# Patient Record
Sex: Female | Born: 1960 | Race: Black or African American | Hispanic: No | Marital: Married | Smoking: Never smoker
Health system: Southern US, Community
[De-identification: ages and names within clinical notes are randomized; demographics above are authoritative.]

## PROBLEM LIST (undated history)

## (undated) DIAGNOSIS — E7800 Pure hypercholesterolemia, unspecified: Secondary | ICD-10-CM

## (undated) DIAGNOSIS — I639 Cerebral infarction, unspecified: Secondary | ICD-10-CM

## (undated) DIAGNOSIS — G4730 Sleep apnea, unspecified: Secondary | ICD-10-CM

## (undated) DIAGNOSIS — K219 Gastro-esophageal reflux disease without esophagitis: Secondary | ICD-10-CM

## (undated) DIAGNOSIS — R7303 Prediabetes: Secondary | ICD-10-CM

## (undated) DIAGNOSIS — D649 Anemia, unspecified: Secondary | ICD-10-CM

## (undated) DIAGNOSIS — D242 Benign neoplasm of left breast: Secondary | ICD-10-CM

## (undated) DIAGNOSIS — I10 Essential (primary) hypertension: Secondary | ICD-10-CM

## (undated) DIAGNOSIS — G40209 Localization-related (focal) (partial) symptomatic epilepsy and epileptic syndromes with complex partial seizures, not intractable, without status epilepticus: Secondary | ICD-10-CM

## (undated) DIAGNOSIS — F419 Anxiety disorder, unspecified: Secondary | ICD-10-CM

## (undated) DIAGNOSIS — R569 Unspecified convulsions: Secondary | ICD-10-CM

## (undated) HISTORY — DX: Prediabetes: R73.03

## (undated) HISTORY — PX: CHOLECYSTECTOMY: SHX55

## (undated) HISTORY — PX: COLONOSCOPY: SHX174

## (undated) HISTORY — PX: PARATHYROIDECTOMY: SHX19

---

## 2002-11-28 ENCOUNTER — Ambulatory Visit (HOSPITAL_COMMUNITY): Admission: RE | Admit: 2002-11-28 | Discharge: 2002-11-28 | Payer: Self-pay | Admitting: Obstetrics

## 2002-11-28 IMAGING — US US TRANSVAGINAL NON-OB
1 series · 17 of 25 positions shown · non-contrast
Comparison: none

CLINICAL DATA: Evaluate fibroids.  The patient is scheduled for hysterectomy.  LMP [DATE].
TRANSABDOMINAL AND TRANSVAGINAL PELVIC ULTRASOUND:
Multiple images of the uterus and adnexa were obtained using a transabdominal and endovaginal approaches.  
The uterus has a maximal sagittal length of 11.4 cm and a maximal AP width of 8.1 cm.  The uterine myometrium demonstrates multiple areas of focally altered echotexture with sizes and locations as follows:  In the fundal region measuring 3.1 x 2.2 x 2.2 cm, in the fundal right lateral region measuring 3.0 x 3.8 x 3.0 cm, in the anterior upper uterine segment left laterally measuring 5.8 x 3.7 x 4.0 cm and in the anterior lower uterine segment towards the left measuring 7.1 x 5.6 x 5.0 cm.  All of these fibroids appear to be predominately mural with the largest fibroid deviating the endometrial canal posteriorly suggesting a least partial submucosal component.  The posterior upper uterine segment and fundal region of the uterus could not be well-assessed endovaginally and is shadowed from the anterior fibroids transabdominally.  This portion of the myometrium was incompletely assessed.
The endometrial canal is thin and echogenic with a maximal AP width of .7 cm.  No areas of focal thickening or inhomogeneity are noted.  Incidental note is made of a large nabothian cyst measuring 1.1 x 1.3 x 1.1 cm and containing some internal debris.
The ovaries could not be seen with confidence either transabdominally or endovaginally.  No pelvic fluid is noted.
IMPRESSION
Fibroid uterus and fibroid sizes and locations as noted above.  Incomplete assessment of the posterior fundal and upper uterine segment portion of the myometrium was possible due to the anterior fibroids and uterine size.  Nonvisualized ovaries.  Normal endometrium.

[Series 1: us transvaginal non-ob · 17 of 36 slices shown]
[im 1/36]
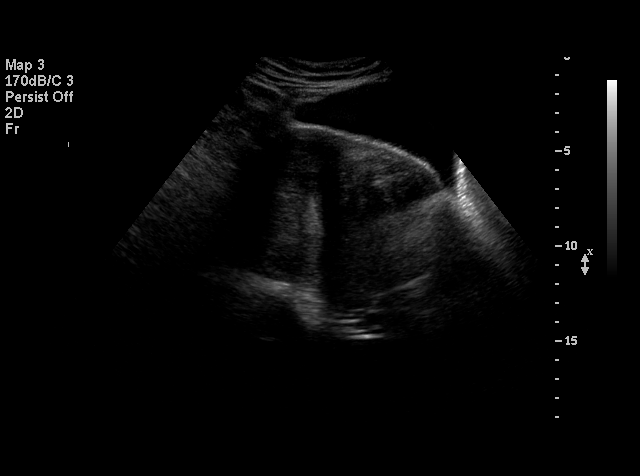
[im 3/36]
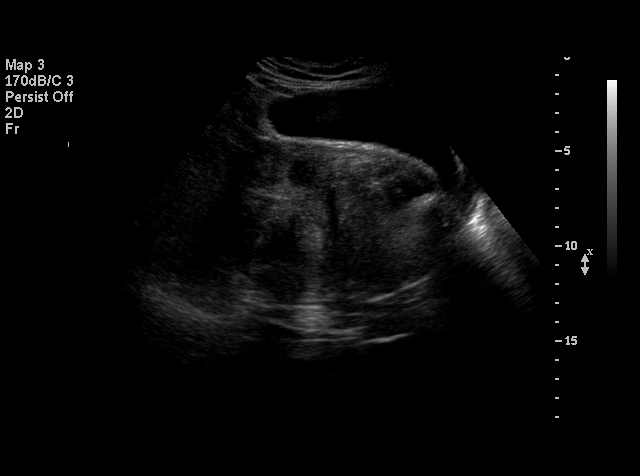
[im 5/36]
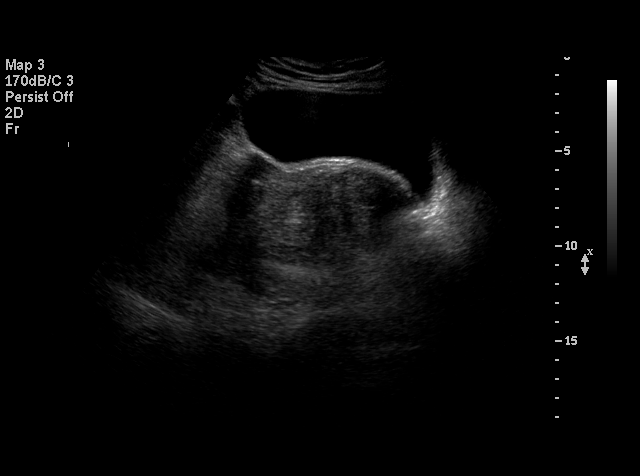
[im 8/36]
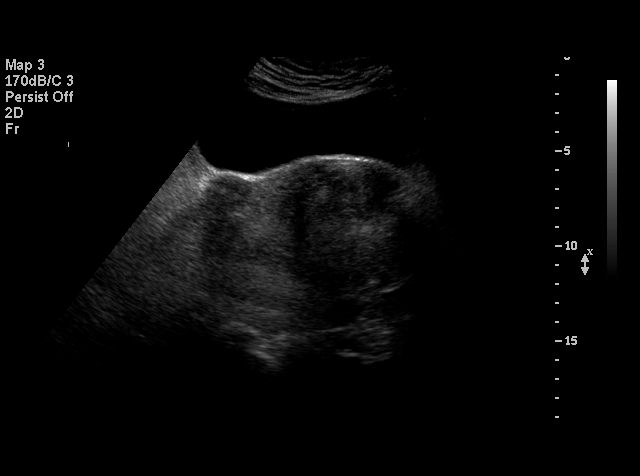
[im 9/36]
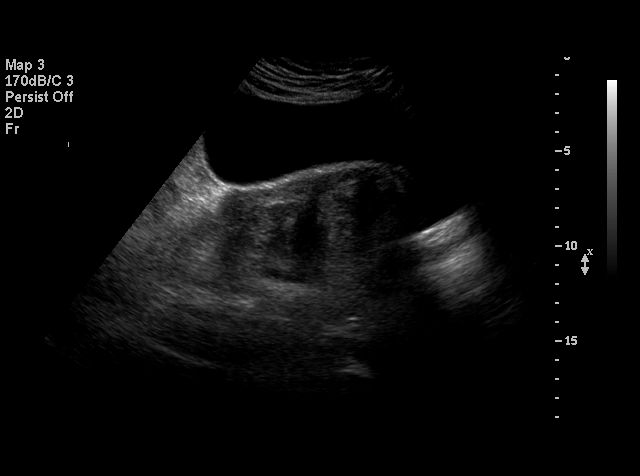
[im 12/36]
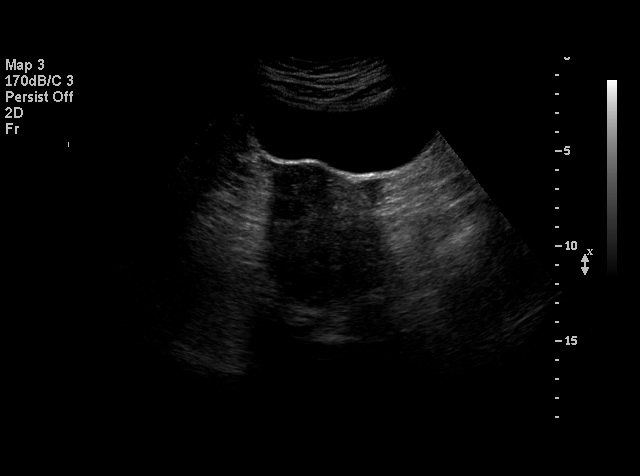
[im 14/36]
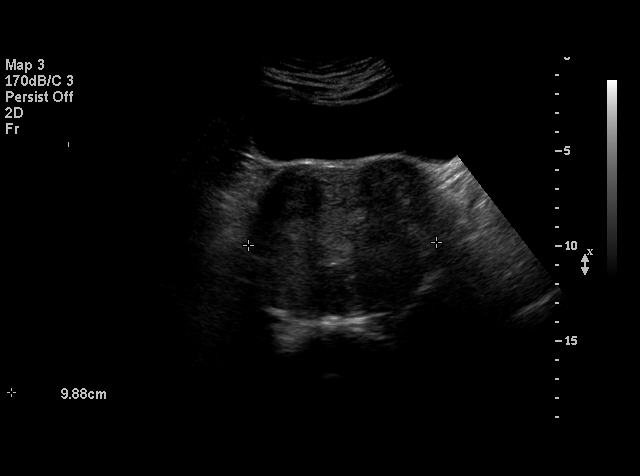
[im 17/36]
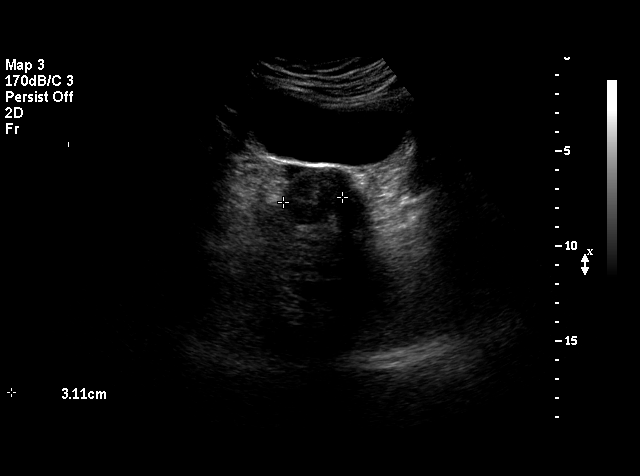
[im 18/36]
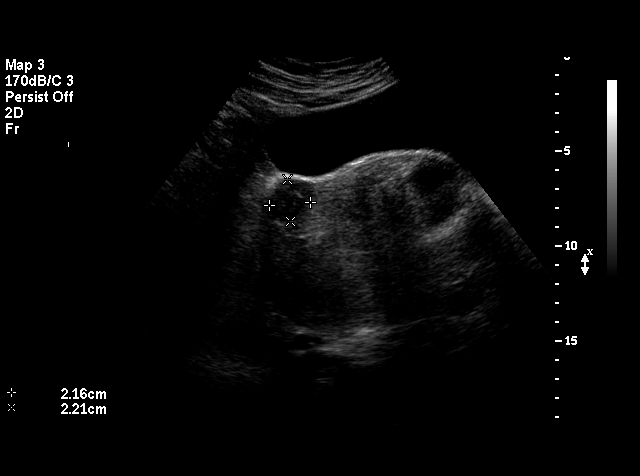
[im 19/36]
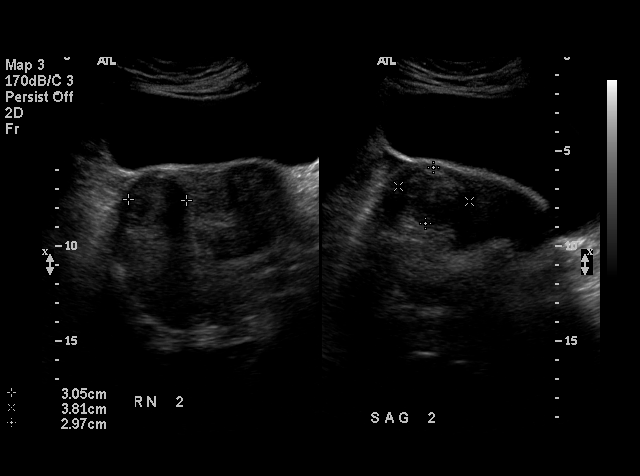
[im 22/36]
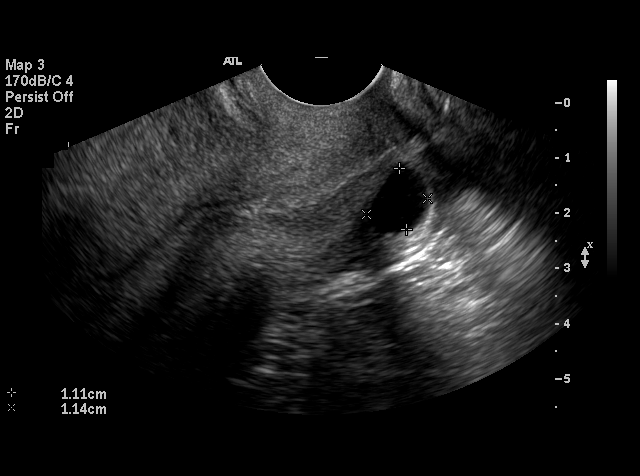
[im 24/36]
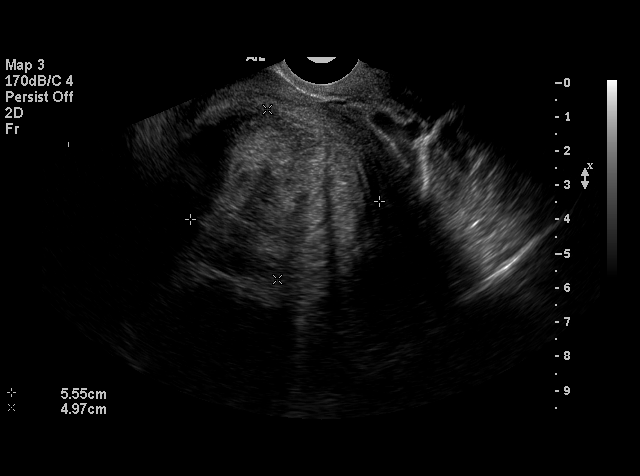
[im 27/36]
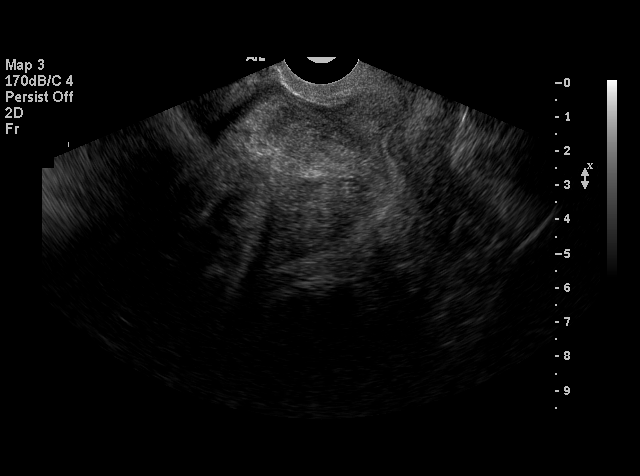
[im 28/36]
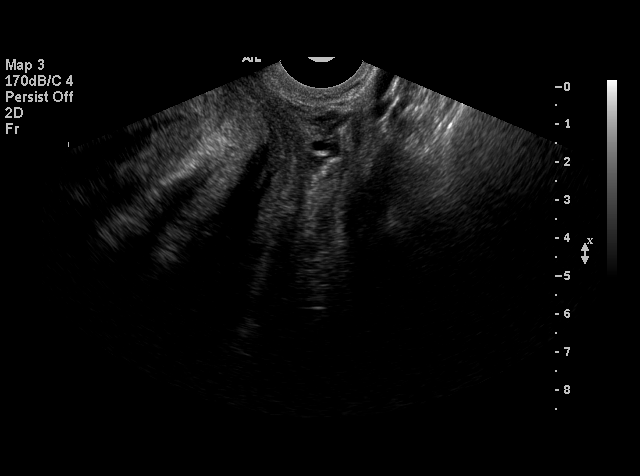
[im 31/36]
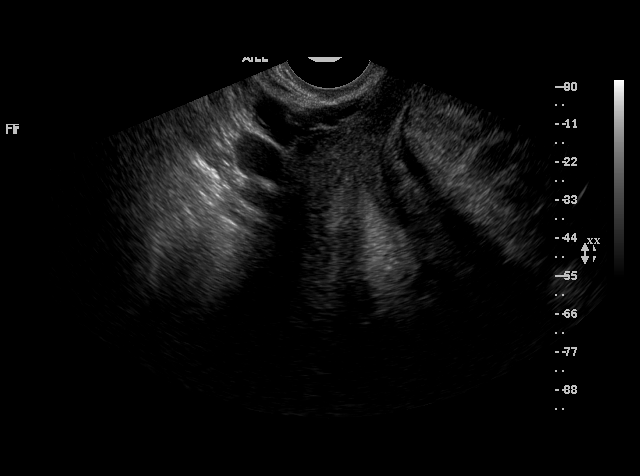
[im 33/36]
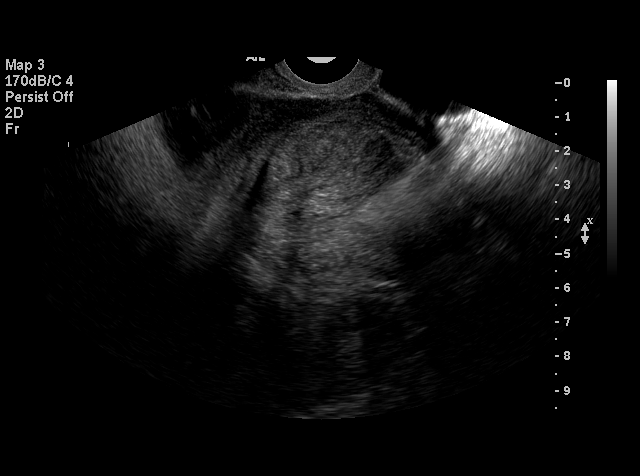
[im 36/36]
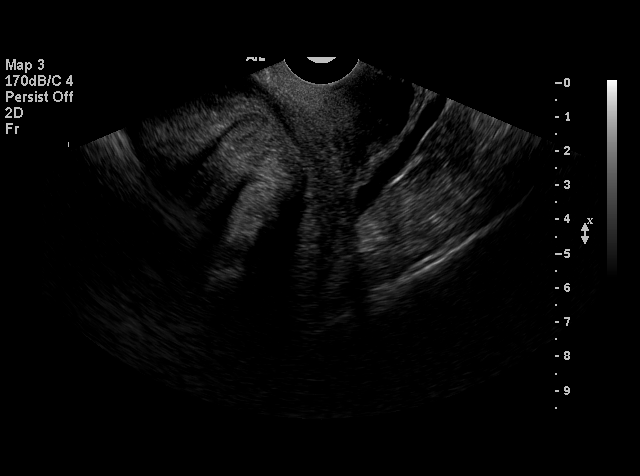

[17 of 25 positions shown; findings below may reference images not displayed]

## 2002-12-13 ENCOUNTER — Encounter (INDEPENDENT_AMBULATORY_CARE_PROVIDER_SITE_OTHER): Payer: Self-pay | Admitting: Specialist

## 2002-12-13 ENCOUNTER — Inpatient Hospital Stay (HOSPITAL_COMMUNITY): Admission: RE | Admit: 2002-12-13 | Discharge: 2002-12-16 | Payer: Self-pay | Admitting: Obstetrics

## 2002-12-13 HISTORY — PX: ABDOMINAL HYSTERECTOMY: SHX81

## 2004-02-22 ENCOUNTER — Emergency Department (HOSPITAL_COMMUNITY): Admission: EM | Admit: 2004-02-22 | Discharge: 2004-02-22 | Payer: Self-pay | Admitting: Family Medicine
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
## 2004-05-15 ENCOUNTER — Ambulatory Visit (HOSPITAL_COMMUNITY): Admission: RE | Admit: 2004-05-15 | Discharge: 2004-05-15 | Payer: Self-pay | Admitting: Obstetrics

## 2004-05-15 IMAGING — US US TRANSVAGINAL NON-OB
1 series · 14 of 25 positions shown · non-contrast
Comparison: none

CLINICAL DATA: Status post hysterectomy and left oophorectomy. Assess right ovary.  Pelvic pain.
 TRANSABDOMINAL AND ENDOVAGINAL PELVIC ULTRASOUND:
 Multiple images of the pelvis were obtained using a transabdominal and endovaginal approaches. 
 A normal vaginal cuff is seen.  The right ovary measures 3.0  x2.1 x 1.9 cm and has a normal appearance.  No pelvic fluid or separate adnexal masses are noted.

[Series 1: us transvaginal non-ob · 0.39mm/px · 14 of 32 slices shown]
[im 1/32]
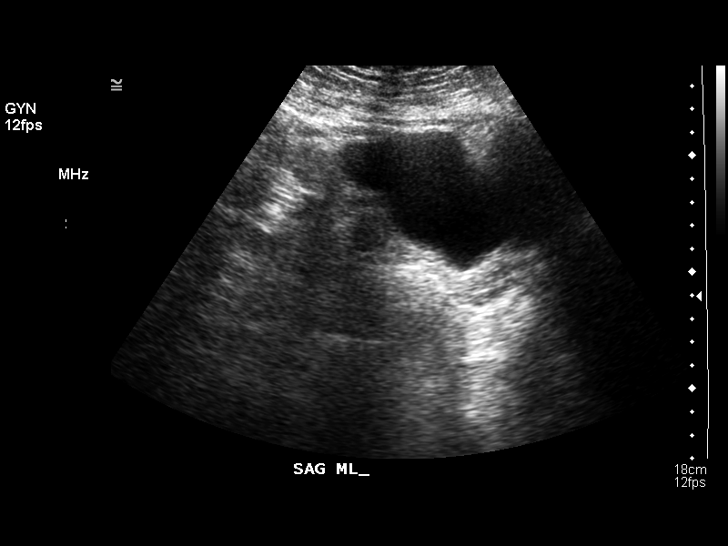
[im 3/32]
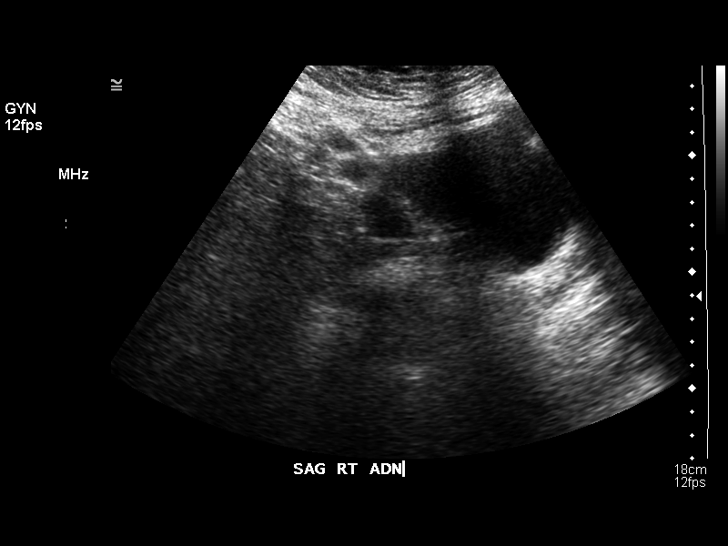
[im 6/32]
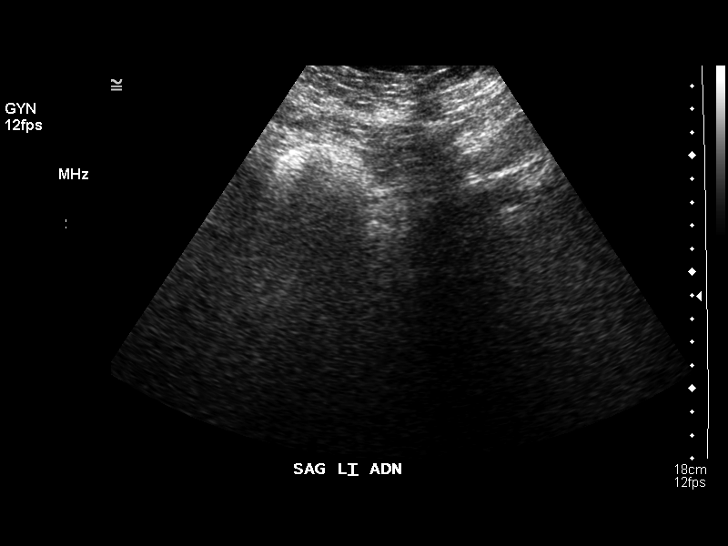
[im 8/32]
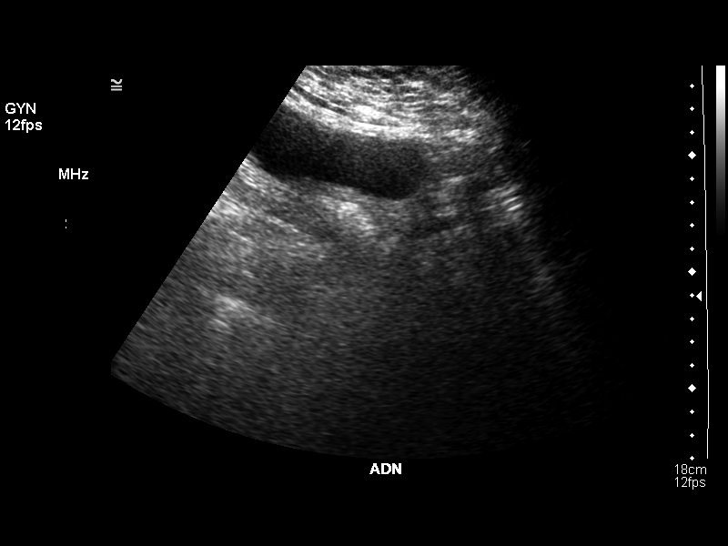
[im 11/32]
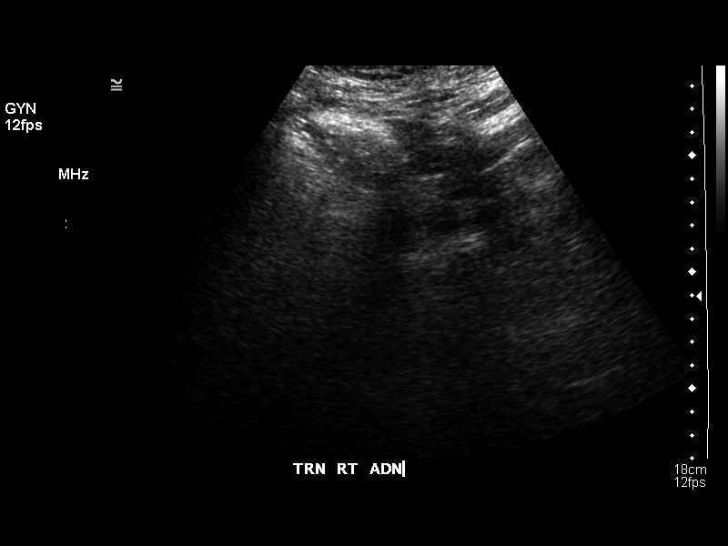
[im 12/32]
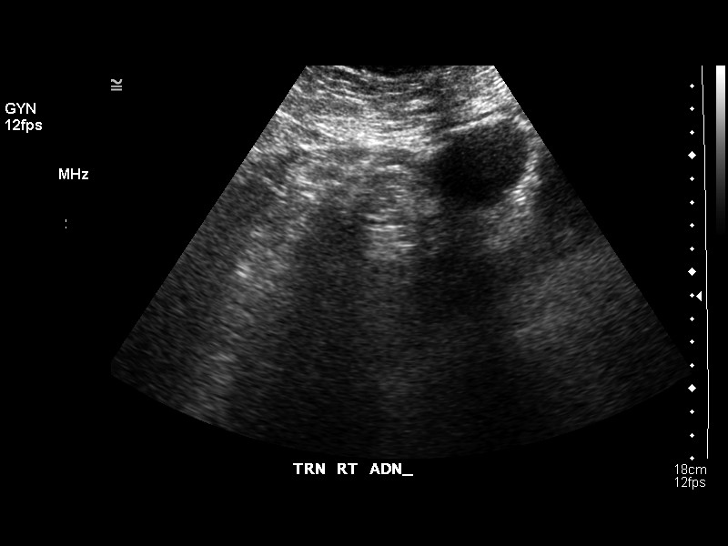
[im 15/32]
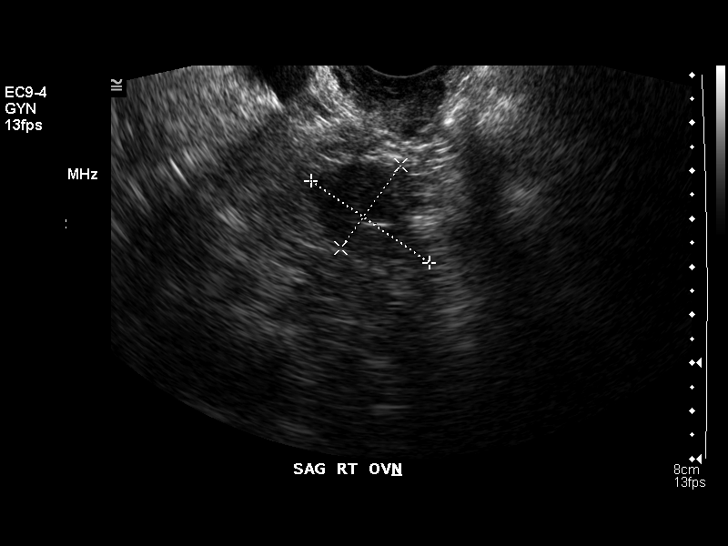
[im 17/32]
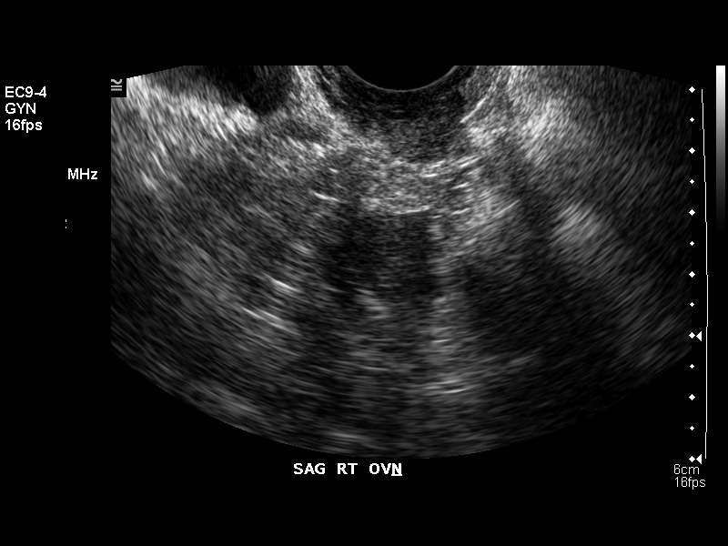
[im 20/32]
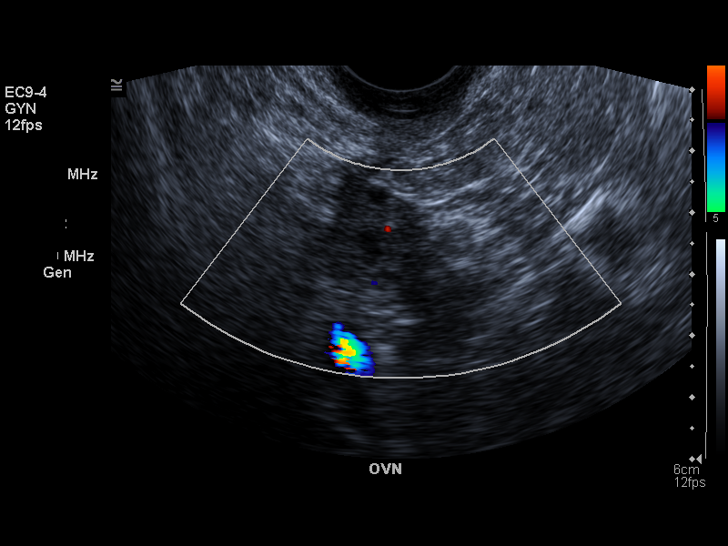
[im 21/32]
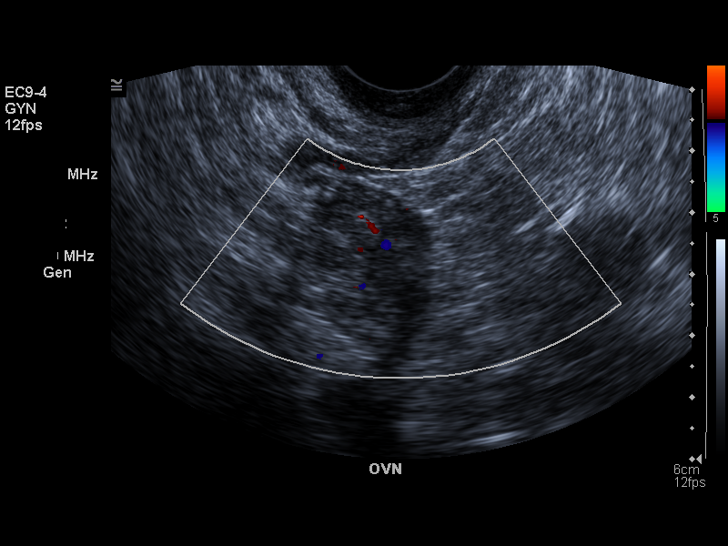
[im 24/32]
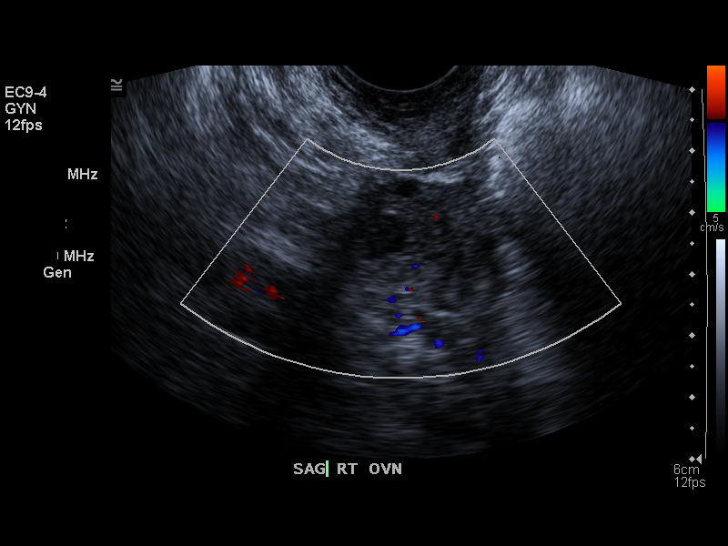
[im 26/32]
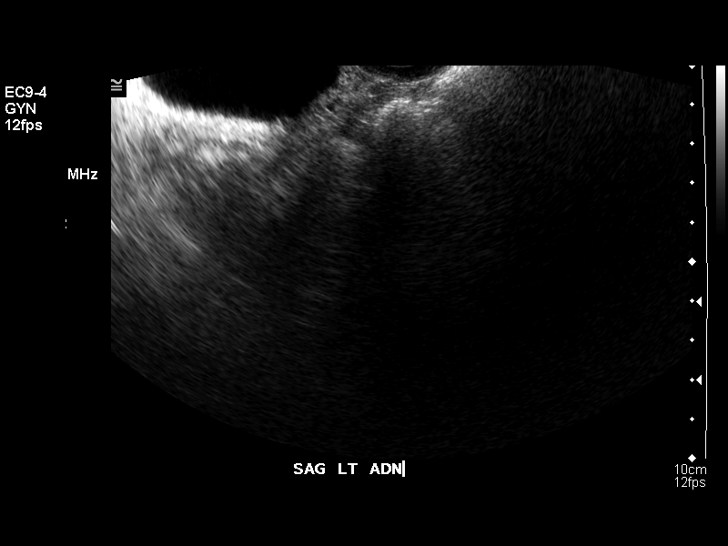
[im 29/32]
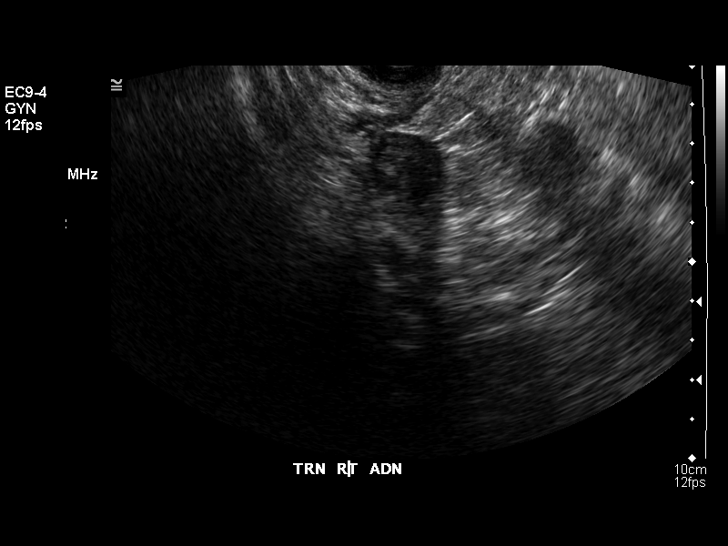
[im 32/32]
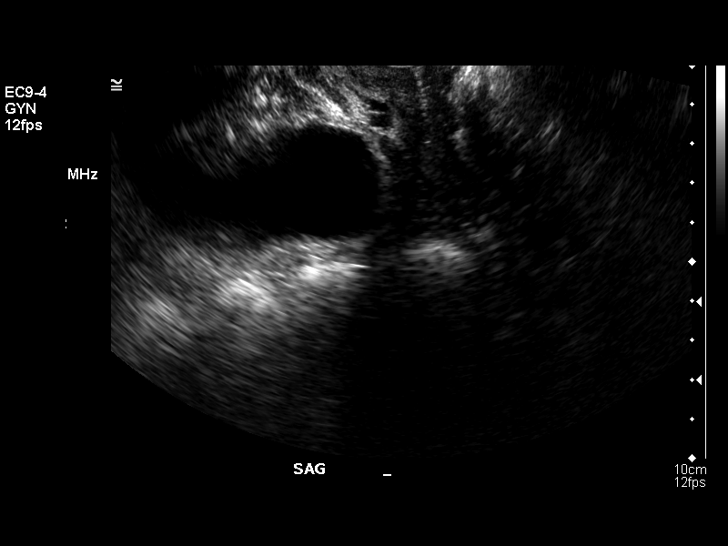

[14 of 25 positions shown; findings below may reference images not displayed]

IMPRESSION: Normal vaginal cuff and right ovary.

## 2006-02-02 ENCOUNTER — Ambulatory Visit (HOSPITAL_COMMUNITY): Admission: RE | Admit: 2006-02-02 | Discharge: 2006-02-02 | Payer: Self-pay | Admitting: Family Medicine

## 2006-02-17 ENCOUNTER — Encounter: Admission: RE | Admit: 2006-02-17 | Discharge: 2006-02-17 | Payer: Self-pay | Admitting: Family Medicine

## 2006-06-09 ENCOUNTER — Emergency Department (HOSPITAL_COMMUNITY): Admission: EM | Admit: 2006-06-09 | Discharge: 2006-06-09 | Payer: Self-pay | Admitting: Family Medicine

## 2006-11-16 ENCOUNTER — Emergency Department (HOSPITAL_COMMUNITY): Admission: EM | Admit: 2006-11-16 | Discharge: 2006-11-16 | Payer: Self-pay | Admitting: Emergency Medicine

## 2007-03-30 ENCOUNTER — Encounter: Admission: RE | Admit: 2007-03-30 | Discharge: 2007-03-30 | Payer: Self-pay | Admitting: Family Medicine

## 2008-02-01 ENCOUNTER — Emergency Department (HOSPITAL_COMMUNITY): Admission: EM | Admit: 2008-02-01 | Discharge: 2008-02-01 | Payer: Self-pay | Admitting: Family Medicine

## 2008-04-11 ENCOUNTER — Encounter: Admission: RE | Admit: 2008-04-11 | Discharge: 2008-04-11 | Payer: Self-pay | Admitting: Family Medicine

## 2009-05-27 ENCOUNTER — Emergency Department (HOSPITAL_COMMUNITY): Admission: EM | Admit: 2009-05-27 | Discharge: 2009-05-27 | Payer: Self-pay | Admitting: Family Medicine

## 2009-07-03 ENCOUNTER — Encounter: Admission: RE | Admit: 2009-07-03 | Discharge: 2009-07-03 | Payer: Self-pay | Admitting: Family Medicine

## 2009-10-06 ENCOUNTER — Emergency Department (HOSPITAL_COMMUNITY): Admission: EM | Admit: 2009-10-06 | Discharge: 2009-10-06 | Payer: Self-pay | Admitting: Emergency Medicine

## 2010-02-03 ENCOUNTER — Encounter: Payer: Self-pay | Admitting: Family Medicine

## 2010-03-06 ENCOUNTER — Ambulatory Visit: Payer: Self-pay

## 2010-03-06 IMAGING — NM NM NECK EXAM
2 series · 6 of 6 positions shown · non-contrast
Comparison: none

REASON FOR EXAM: hyperparathryoidism  Dr.ARISSA[PHONE_NUMBER]
f[PHONE_NUMBER]   [J9] Premi...
COMMENTS:

[Series 1000: parathyroid 3 hrs · 0.90mm/px · 3 of 3 slices shown]
[im 1/3  full-range]
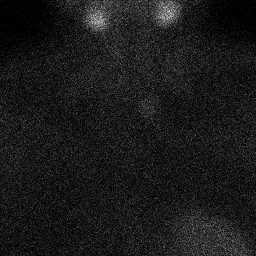
[im 2/3  full-range]
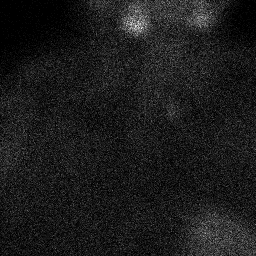
[im 3/3  full-range]
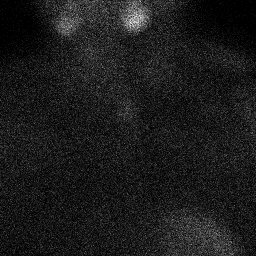

[Series 1000: parathyroid (id) · 0.90mm/px · 3 of 3 slices shown]
[im 1/3  full-range]
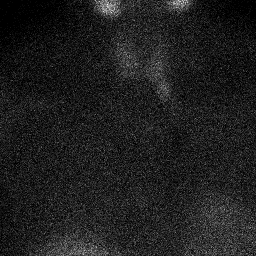
[im 2/3  full-range]
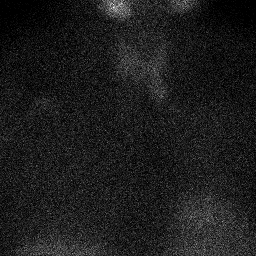
[im 3/3  full-range]
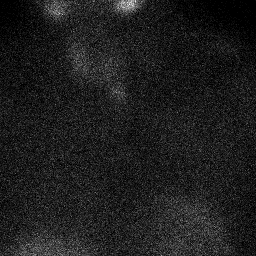

[6 of 6 positions shown; findings below may reference images not displayed]

PROCEDURE:     KNM - KNM PARATHYROID IMAGE  - [DATE] [DATE]

RESULT:     The patient received an injection of 22.66 mCi of technetium 99m
sestamibi. Anterior and bilateral anterior oblique planar images were
obtained at 10 minutes after injection and approximately 3 hours after
injection with a pinhole image without and with a suprasternal notch marker
obtained at approximately 3.5 hours after injection. The initial and delayed
images show a focal area of abnormal localization at the lower pole region
of the left lobe of the thyroid which can be consistent with a parathyroid
adenoma. Only a single focus is seen. This appears to be near the level of
the suprasternal notch according to the marker. No definite mediastinal
abnormal localization is seen. Salivary uptake appears unremarkable.
IMPRESSION: Findings are consistent with a probable left parathyroid adenoma inferior to
the lower pole of the left thyroid gland.

## 2010-04-29 ENCOUNTER — Emergency Department (HOSPITAL_COMMUNITY): Payer: No Typology Code available for payment source

## 2010-04-29 ENCOUNTER — Emergency Department (HOSPITAL_COMMUNITY)
Admission: EM | Admit: 2010-04-29 | Discharge: 2010-04-30 | Disposition: A | Discharge status: Home / Self Care | Payer: No Typology Code available for payment source | Attending: Emergency Medicine | Admitting: Emergency Medicine

## 2010-04-29 DIAGNOSIS — Z79899 Other long term (current) drug therapy: Secondary | ICD-10-CM | POA: Insufficient documentation

## 2010-04-29 DIAGNOSIS — S139XXA Sprain of joints and ligaments of unspecified parts of neck, initial encounter: Secondary | ICD-10-CM | POA: Insufficient documentation

## 2010-04-29 DIAGNOSIS — E785 Hyperlipidemia, unspecified: Secondary | ICD-10-CM | POA: Insufficient documentation

## 2010-04-29 DIAGNOSIS — Y929 Unspecified place or not applicable: Secondary | ICD-10-CM | POA: Insufficient documentation

## 2010-04-29 DIAGNOSIS — S0990XA Unspecified injury of head, initial encounter: Secondary | ICD-10-CM | POA: Insufficient documentation

## 2010-04-29 DIAGNOSIS — G40909 Epilepsy, unspecified, not intractable, without status epilepticus: Secondary | ICD-10-CM | POA: Insufficient documentation

## 2010-04-29 DIAGNOSIS — R51 Headache: Secondary | ICD-10-CM | POA: Insufficient documentation

## 2010-04-29 DIAGNOSIS — R413 Other amnesia: Secondary | ICD-10-CM | POA: Insufficient documentation

## 2010-04-29 LAB — RAPID URINE DRUG SCREEN, HOSP PERFORMED
Amphetamines: NOT DETECTED
Barbiturates: POSITIVE — AB
Benzodiazepines: NOT DETECTED
Cocaine: NOT DETECTED
Opiates: NOT DETECTED
Tetrahydrocannabinol: NOT DETECTED
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
## 2010-04-30 LAB — PHENOBARBITAL LEVEL: Phenobarbital: 34.2 ug/mL (ref 15.0–40.0)

## 2010-05-31 NOTE — Discharge Summary (Signed)
NAME:  Tammy Rodgers, Tammy Rodgers                           ACCOUNT NO.:  [id number]   MEDICAL RECORD NO.:  [id number]                   PATIENT TYPE:   LOCATION:                                       FACILITY:   PHYSICIAN:  Kathreen Cosier, M.D.           DATE OF BIRTH:   DATE OF ADMISSION:  DATE OF DISCHARGE:                                 DISCHARGE SUMMARY   HISTORY OF PRESENT ILLNESS:  The patient is a 50 year old female with 20-  week sized myomas who was having abnormal bleeding, pressure, and pain and  brought in for definitive therapy.  On admission her hemoglobin was 12.9,  platelets 307,000, white count 3.5.  PT/PTT normal.  Sodium 142, potassium  4.2, chloride 102, BUN 9, creatinine 0.7.  The patient underwent a TAH and  left salpingo-oophorectomy.  Blood loss was 200 mL.  Postoperatively she did  well.  Her hemoglobin postoperatively was 10.4.  She was passing flatus, had  bowel movement, and had no complaint at the time of discharge.  She was  discharged on Tylox one to two q.3-4h. p.r.n. to see me in four weeks.   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                                               Kathreen Cosier, M.D.    BAM/MEDQ  D:  12/16/2002  T:  12/16/2002  Job:  [id number]

## 2010-05-31 NOTE — Op Note (Signed)
NAMECARMELLE, Tammy Rodgers                         ACCOUNT NO.:  [id number]   MEDICAL RECORD NO.:  [id number]                   PATIENT TYPE:  INP   LOCATION:  9309                                 FACILITY:  [facility]   PHYSICIAN:  Kathreen Cosier, M.D.           DATE OF BIRTH:  20-Nov-1960   DATE OF PROCEDURE:  12/13/2002  DATE OF DISCHARGE:  12/16/2002                                 OPERATIVE REPORT   PREOPERATIVE DIAGNOSIS:  Myoma uteri.   SURGEON:  Kathreen Cosier, M.D.   FIRST ASSISTANT:  Charles A. Clearance Coots, M.D.   ANESTHESIA:  General.   DESCRIPTION OF PROCEDURE:  Patient placed on the operating table in supine  position.  Abdomen prepped and draped, bladder emptied with a Foley  catheter.  A transverse suprapubic incision was carried down to the rectus  fascia, the fascia cleaned and incised the length of the incision, the recti  muscles retracted laterally.  She was noted to have multiple myomas,  approximately seven, and had one pregnancy.  The right round ligament was  grasped with a Kelly clamp and suture ligated with #1 chromic.  We proceeded  down in a similar fashion to the other side.  Using the Metzenbaum scissors,  a bladder flap was developed.  The right utero-ovarian ligament was grasped  with a Kelly clamp and suture ligated with #1 chromic.  The procedure done  in a similar fashion on the left side.  The uterine vessels were  skeletonized bilaterally, double clamped on the right with Heaney clamps,  both suture ligated with #1 chromic.  The procedure done in a similar  fashion on the left side.  The bladder was pushed off of the cervix.  The  cardinal and uterosacral ligaments grasped with a straight Kocher clamp.  The pedicle was cut and sutured with #1 chromic.  The specimen consisting of  the uterus was removed at the cervicovaginal junction.  Modified Richardson  sutures placed in the angles of the vagina and the vaginal vault run with  interlocking suture of  #1 chromic.  Hemostasis was satisfactory.  She had  persistent bleeding from the left ovary, which did not stop with multiple  repairs.  A Kelly clamp was placed across the infundibulopelvic ligament,  cut, suture ligated with #1 chromic.  Hemostasis was satisfactory, lap and  sponge counts correct, blood loss 200 mL.  Abdomen closed in layers,  peritoneum with continuous suture of 0 chromic, fascia with continuous  suture of 0 Dexon, and the skin closed with subcuticular stitch of 4-0  Monocryl.                                               Kathreen Cosier, M.D.    BAM/MEDQ  D:  01/13/2003  T:  01/13/2003  Job:  [id number]

## 2010-11-20 ENCOUNTER — Emergency Department (INDEPENDENT_AMBULATORY_CARE_PROVIDER_SITE_OTHER)
Admission: EM | Admit: 2010-11-20 | Discharge: 2010-11-20 | Disposition: A | Discharge status: Home / Self Care | Source: Home / Self Care | Attending: Family Medicine | Admitting: Family Medicine

## 2010-11-20 DIAGNOSIS — H6980 Other specified disorders of Eustachian tube, unspecified ear: Secondary | ICD-10-CM

## 2010-11-20 HISTORY — DX: Pure hypercholesterolemia, unspecified: E78.00

## 2010-11-20 MED ORDER — FLUTICASONE PROPIONATE 50 MCG/ACT NA SUSP: 2.0000 | Freq: Every day | NASAL | Status: DC

## 2010-11-20 NOTE — ED Provider Notes (Signed)
History     CSN: [id number] Arrival date & time: 11/20/2010  8:08 PM   First MD Initiated Contact with Patient 11/20/10 2009      Chief Complaint  Patient presents with  . Otalgia    (Consider location/radiation/quality/duration/timing/severity/associated sxs/prior treatment) Patient is a 50 y.o. female presenting with ear pain. The history is provided by the patient.  Otalgia This is a new problem. The current episode started more than 2 days ago. There is pain in both ears. The problem occurs constantly. The problem has not changed since onset.There has been no fever. The pain is mild. Associated symptoms include headaches and cough. Pertinent negatives include no ear discharge, no hearing loss, no sore throat and no neck pain. Her past medical history does not include chronic ear infection or hearing loss.    Past Medical History  Diagnosis Date  . Seizure disorder   . High cholesterol     Past Surgical History  Procedure Date  . Abdominal hysterectomy   . Cholecystectomy     History reviewed. No pertinent family history.  History  Substance Use Topics  . Smoking status: Never Smoker   . Smokeless tobacco: Not on file  . Alcohol Use: No    OB History    Grav Para Term Preterm Abortions TAB SAB Ect Mult Living                  Review of Systems  Constitutional: Negative.   HENT: Positive for ear pain, congestion and tinnitus. Negative for hearing loss, sore throat, sneezing, trouble swallowing, neck pain, neck stiffness, sinus pressure and ear discharge.   Eyes: Negative.   Respiratory: Positive for cough.   Cardiovascular: Negative.   Gastrointestinal: Negative.   Genitourinary: Negative.   Neurological: Positive for headaches.    Allergies  Review of patient's allergies indicates no known allergies.  Home Medications   Current Outpatient Rx  Name Route Sig Dispense Refill  . EZETIMIBE-SIMVASTATIN 10-10 MG PO TABS Oral Take 1 tablet by mouth at  bedtime.      Marland Kitchen LAMOTRIGINE 200 MG PO TABS Oral Take 200 mg by mouth daily.      Marland Kitchen PHENOBARBITAL 100 MG PO TABS Oral Take 100 mg by mouth 2 (two) times daily.        BP 149/74  Pulse 76  Temp(Src) 98.2 F (36.8 C) (Oral)  Resp 17  SpO2 99%  Physical Exam  Constitutional: She appears well-developed and well-nourished.  HENT:  Head: Normocephalic and atraumatic.  Right Ear: No drainage. Tympanic membrane is retracted. Tympanic membrane is not perforated, not erythematous and not bulging. No hemotympanum.  Left Ear: No drainage. Tympanic membrane is retracted. Tympanic membrane is not perforated, not erythematous and not bulging. No hemotympanum.  Nose: Nose normal.  Mouth/Throat: Uvula is midline, oropharynx is clear and moist and mucous membranes are normal. No posterior oropharyngeal erythema.  Eyes: EOM are normal.  Neck: Normal range of motion. Neck supple.    ED Course  Procedures (including critical care time)  Labs Reviewed - No data to display No results found.   No diagnosis found.    MDM          Richardo Priest, MD 11/20/10 2030

## 2010-11-20 NOTE — ED Notes (Signed)
Pain in both ears for couple pf days

## 2011-01-04 ENCOUNTER — Emergency Department (INDEPENDENT_AMBULATORY_CARE_PROVIDER_SITE_OTHER)
Admission: EM | Admit: 2011-01-04 | Discharge: 2011-01-04 | Disposition: A | Discharge status: Home / Self Care | Source: Home / Self Care | Attending: Family Medicine | Admitting: Family Medicine

## 2011-01-04 ENCOUNTER — Encounter (HOSPITAL_COMMUNITY): Payer: Self-pay

## 2011-01-04 DIAGNOSIS — R1032 Left lower quadrant pain: Secondary | ICD-10-CM

## 2011-01-04 DIAGNOSIS — IMO0001 Reserved for inherently not codable concepts without codable children: Secondary | ICD-10-CM

## 2011-01-04 LAB — POCT URINALYSIS DIP (DEVICE)
Bilirubin Urine: NEGATIVE
Glucose, UA: NEGATIVE mg/dL
Hgb urine dipstick: NEGATIVE
Ketones, ur: NEGATIVE mg/dL
Leukocytes, UA: NEGATIVE
Nitrite: NEGATIVE
Protein, ur: NEGATIVE mg/dL
Specific Gravity, Urine: 1.015 (ref 1.005–1.030)
Urobilinogen, UA: 0.2 mg/dL (ref 0.0–1.0)
pH: 8.5 — ABNORMAL HIGH (ref 5.0–8.0)

## 2011-01-04 MED ORDER — HYDROCODONE-ACETAMINOPHEN 5-325 MG PO TABS: ORAL_TABLET | ORAL | Status: AC

## 2011-01-04 NOTE — ED Provider Notes (Signed)
History     CSN: [id number]  Arrival date & time 01/04/11  0909   First MD Initiated Contact with Patient 01/04/11 [phone]      Chief Complaint  Patient presents with  . Flank Pain    (Consider location/radiation/quality/duration/timing/severity/associated sxs/prior treatment) HPI Comments: Tammy Rodgers presents for evaluation of sudden onset of LEFT groin pain that started this morning around 5 am. She reports several bowel movements at the time, with frequent urination. She denies any hematuria, no dysuria, no change in appetite, no fever. She reports hx of hysterectomy.  Patient is a 50 y.o. female presenting with groin pain. The history is provided by the patient.  Groin Pain This is a new problem. The current episode started 3 to 5 hours ago. The problem occurs constantly. The problem has been gradually improving. The symptoms are aggravated by nothing. The symptoms are relieved by nothing. Treatments tried: ibuprofen. The treatment provided mild relief.    Past Medical History  Diagnosis Date  . Seizure disorder   . High cholesterol     Past Surgical History  Procedure Date  . Abdominal hysterectomy   . Cholecystectomy     History reviewed. No pertinent family history.  History  Substance Use Topics  . Smoking status: Never Smoker   . Smokeless tobacco: Not on file  . Alcohol Use: No    OB History    Grav Para Term Preterm Abortions TAB SAB Ect Mult Living                  Review of Systems  Constitutional: Negative.   HENT: Negative.   Eyes: Negative.   Respiratory: Negative.   Cardiovascular: Negative.   Gastrointestinal: Negative.   Genitourinary: Positive for frequency, flank pain and pelvic pain. Negative for dysuria, urgency, hematuria, vaginal bleeding, vaginal discharge and vaginal pain.  Musculoskeletal: Negative.   Skin: Negative.   Neurological: Negative.     Allergies  Review of patient's allergies indicates no known allergies.  Home  Medications   Current Outpatient Rx  Name Route Sig Dispense Refill  . EZETIMIBE-SIMVASTATIN 10-10 MG PO TABS Oral Take 1 tablet by mouth at bedtime.      Marland Kitchen FLUTICASONE PROPIONATE 50 MCG/ACT NA SUSP Nasal Place 2 sprays into the nose daily. 16 g 2  . HYDROCODONE-ACETAMINOPHEN 5-325 MG PO TABS  Take one to two tablets every 4 to 6 hours as needed for pain 20 tablet 0  . LAMOTRIGINE 200 MG PO TABS Oral Take 200 mg by mouth daily.      Marland Kitchen PHENOBARBITAL 100 MG PO TABS Oral Take 100 mg by mouth 2 (two) times daily.        BP 149/81  Pulse 80  Temp(Src) 97.1 F (36.2 C) (Oral)  Resp 18  SpO2 97%  Physical Exam  Nursing note and vitals reviewed. Constitutional: She is oriented to person, place, and time. She appears well-developed and well-nourished.  HENT:  Head: Normocephalic and atraumatic.  Eyes: EOM are normal.  Neck: Normal range of motion.  Pulmonary/Chest: Effort normal.  Abdominal: Soft. Normal appearance and bowel sounds are normal. There is no tenderness.  Musculoskeletal: Normal range of motion.  Neurological: She is alert and oriented to person, place, and time.  Skin: Skin is warm and dry.  Psychiatric: Her behavior is normal.    ED Course  Procedures (including critical care time)  Labs Reviewed  POCT URINALYSIS DIP (DEVICE) - Abnormal; Notable for the following:    pH 8.5 (*)  All other components within normal limits  POCT URINALYSIS DIPSTICK   No results found.   1. Groin pain, left lower quadrant       MDM  Suspect passage of kidney stone; return to ED if symptoms worsen in any way.         Richardo Priest, MD 01/04/11 [phone]

## 2011-01-04 NOTE — ED Notes (Signed)
Pt has lt flank and lower abd pain since 5am today, frequent urination.

## 2012-06-27 ENCOUNTER — Encounter (HOSPITAL_COMMUNITY): Payer: Self-pay | Admitting: *Deleted

## 2012-06-27 ENCOUNTER — Emergency Department (HOSPITAL_COMMUNITY)
Admission: EM | Admit: 2012-06-27 | Discharge: 2012-06-27 | Disposition: A | Discharge status: Home / Self Care | Source: Home / Self Care | Attending: Emergency Medicine | Admitting: Emergency Medicine

## 2012-06-27 DIAGNOSIS — H109 Unspecified conjunctivitis: Secondary | ICD-10-CM

## 2012-06-27 HISTORY — DX: Essential (primary) hypertension: I10

## 2012-06-27 HISTORY — DX: Unspecified convulsions: R56.9

## 2012-06-27 MED ORDER — CIPROFLOXACIN HCL 0.3 % OP SOLN: 2.0000 [drp] | OPHTHALMIC | Status: DC

## 2012-06-27 MED ORDER — LORATADINE 10 MG PO TABS: 10.0000 mg | ORAL_TABLET | Freq: Every day | ORAL | Status: DC

## 2012-06-27 NOTE — ED Notes (Signed)
Patient complains of eye redness and itching in left and right eye x 3 days.

## 2012-06-27 NOTE — ED Provider Notes (Signed)
History     CSN: [id number]  Arrival date & time 06/27/12  1202   First MD Initiated Contact with Patient 06/27/12 1431      Chief Complaint  Patient presents with  . Conjunctivitis    (Consider location/radiation/quality/duration/timing/severity/associated sxs/prior treatment) HPI  52 yo bf with bilat eye redness, itching, discharge.  Symptoms ongoing x 3 days.  Was seen by PCP about a month ago and given claritin for allergies.  She used this medication a couple of days ago and did improve some of the redness.  Left eye worse and has had some crusty irritation in the morning.  Some left eye discomfort but not too bad.  Denies fever, chills, headache, sinus pressure, cp, sob, cough, sore throat, photophobia.    Past Medical History  Diagnosis Date  . Seizure disorder   . High cholesterol   . Hypertension   . Seizures     Past Surgical History  Procedure Laterality Date  . Abdominal hysterectomy    . Cholecystectomy      No family history on file.  History  Substance Use Topics  . Smoking status: Never Smoker   . Smokeless tobacco: Not on file  . Alcohol Use: No    OB History   Grav Para Term Preterm Abortions TAB SAB Ect Mult Living                  Review of Systems  Constitutional: Negative.   HENT: Negative.   Eyes: Positive for pain (some discomfort left), discharge (clear to yellow), redness and itching. Negative for photophobia and visual disturbance.  Respiratory: Negative.   Cardiovascular: Negative.   Gastrointestinal: Negative.   Endocrine: Negative.   Genitourinary: Negative.   Allergic/Immunologic: Positive for environmental allergies.  Neurological: Negative.     Allergies  Review of patient's allergies indicates no known allergies.  Home Medications   Current Outpatient Rx  Name  Route  Sig  Dispense  Refill  . benzonatate (TESSALON) 100 MG capsule   Oral   Take 100 mg by mouth 3 (three) times daily as needed for cough.         .  DULoxetine (CYMBALTA) 60 MG capsule   Oral   Take 60 mg by mouth daily.         . hydrochlorothiazide (MICROZIDE) 12.5 MG capsule   Oral   Take 12.5 mg by mouth daily.         Marland Kitchen lamoTRIgine (LAMICTAL) 200 MG tablet   Oral   Take 200 mg by mouth daily.           Marland Kitchen loratadine (CLARITIN) 10 MG tablet   Oral   Take 10 mg by mouth daily.         Marland Kitchen PHENObarbital (LUMINAL) 100 MG tablet   Oral   Take 100 mg by mouth 2 (two) times daily.           Marland Kitchen ezetimibe-simvastatin (VYTORIN) 10-10 MG per tablet   Oral   Take 1 tablet by mouth at bedtime.           Marland Kitchen EXPIRED: fluticasone (FLONASE) 50 MCG/ACT nasal spray   Nasal   Place 2 sprays into the nose daily.   16 g   2     There were no vitals taken for this visit.  Physical Exam  Constitutional: She is oriented to person, place, and time. She appears well-developed and well-nourished.  HENT:  Head: Normocephalic and atraumatic.  Nose: Nose normal.  Mouth/Throat: Oropharynx is clear and moist.  Eyes: Pupils are equal, round, and reactive to light. Scleral icterus (left greater than right) is present.  Neck: Normal range of motion.  Cardiovascular: Normal rate, regular rhythm and normal heart sounds.   Pulmonary/Chest: Effort normal and breath sounds normal.  Musculoskeletal: Normal range of motion.  Neurological: She is alert and oriented to person, place, and time.  Skin: Skin is warm and dry.    ED Course  Procedures (including critical care time)  Labs Reviewed - No data to display No results found.  Assessment:  conjunctivitis   MDM  Given scripts for cipro otic solution and claritin.  Will f/u with [place] as needed or can contact her primary care physician if symptoms not resolved over the next few days.    Meds ordered this encounter  Medications  . loratadine (CLARITIN) 10 MG tablet    Sig: Take 10 mg by mouth daily.  . hydrochlorothiazide (MICROZIDE) 12.5 MG capsule    Sig: Take 12.5 mg by mouth  daily.  . benzonatate (TESSALON) 100 MG capsule    Sig: Take 100 mg by mouth 3 (three) times daily as needed for cough.  . DULoxetine (CYMBALTA) 60 MG capsule    Sig: Take 60 mg by mouth daily.  . ciprofloxacin (CILOXAN) 0.3 % ophthalmic solution    Sig: Place 2 drops into both eyes every 2 (two) hours. Administer 1 drop, every 2 hours, while awake, for 2 days. Then 1 drop, every 4 hours, while awake, for the next 5 days.    Dispense:  10 mL    Refill:  0  . loratadine (CLARITIN) 10 MG tablet    Sig: Take 1 tablet (10 mg total) by mouth daily.    Dispense:  30 tablet    Refill:  1          Naida Sleight, PA-C 06/27/12 1502

## 2012-07-30 ENCOUNTER — Other Ambulatory Visit (HOSPITAL_COMMUNITY)
Admission: RE | Admit: 2012-07-30 | Discharge: 2012-07-30 | Disposition: A | Discharge status: Home / Self Care | Source: Ambulatory Visit | Attending: Family Medicine | Admitting: Family Medicine

## 2012-07-30 ENCOUNTER — Other Ambulatory Visit: Payer: Self-pay | Admitting: Family Medicine

## 2012-07-30 DIAGNOSIS — N6459 Other signs and symptoms in breast: Secondary | ICD-10-CM | POA: Insufficient documentation

## 2012-08-24 ENCOUNTER — Encounter (HOSPITAL_COMMUNITY): Payer: Self-pay | Admitting: *Deleted

## 2012-08-24 ENCOUNTER — Emergency Department (HOSPITAL_COMMUNITY)
Admission: EM | Admit: 2012-08-24 | Discharge: 2012-08-25 | Disposition: A | Discharge status: Home / Self Care | Attending: Emergency Medicine | Admitting: Emergency Medicine

## 2012-08-24 DIAGNOSIS — R42 Dizziness and giddiness: Secondary | ICD-10-CM

## 2012-08-24 DIAGNOSIS — Z8669 Personal history of other diseases of the nervous system and sense organs: Secondary | ICD-10-CM

## 2012-08-24 DIAGNOSIS — Z862 Personal history of diseases of the blood and blood-forming organs and certain disorders involving the immune mechanism: Secondary | ICD-10-CM | POA: Insufficient documentation

## 2012-08-24 DIAGNOSIS — G40909 Epilepsy, unspecified, not intractable, without status epilepticus: Secondary | ICD-10-CM | POA: Insufficient documentation

## 2012-08-24 DIAGNOSIS — Z8639 Personal history of other endocrine, nutritional and metabolic disease: Secondary | ICD-10-CM | POA: Insufficient documentation

## 2012-08-24 DIAGNOSIS — Z79899 Other long term (current) drug therapy: Secondary | ICD-10-CM | POA: Insufficient documentation

## 2012-08-24 DIAGNOSIS — I10 Essential (primary) hypertension: Secondary | ICD-10-CM

## 2012-08-24 DIAGNOSIS — R51 Headache: Secondary | ICD-10-CM | POA: Insufficient documentation

## 2012-08-24 LAB — GLUCOSE, CAPILLARY: Glucose-Capillary: 126 mg/dL — ABNORMAL HIGH (ref 70–99)

## 2012-08-24 NOTE — ED Notes (Signed)
Pt states that she was started on BP meds approx 3 months ago after a reaction to a CT scan.

## 2012-08-24 NOTE — ED Provider Notes (Signed)
CSN: [id number]     Arrival date & time 08/24/12  2215 History     First MD Initiated Contact with Patient 08/24/12 2346     Chief Complaint  Patient presents with  . Dizziness  . Hypertension   HPI Tammy Rodgers is a 52 y.o. female presenting with multiple complaints. Patient says she's had moderate shakiness and malaise with headache has been mild to moderate on off for 2 days. Patient does describe some dizziness described as movement when she turns her head or moves, was associated with some nausea. Patient's blood sugar is elevated tonight after eating at 176, and she also had elevated blood pressure at 190 systolic at home. Patient was recently put on hydrochlorothiazide but has been only taking 12.5 mg daily. Patient also complains about staring spells in the emergency department, patient does have a history of these type of seizures.    Past Medical History  Diagnosis Date  . Seizure disorder   . High cholesterol   . Hypertension   . Seizures    Past Surgical History  Procedure Laterality Date  . Abdominal hysterectomy    . Cholecystectomy    . Parathyroidectomy     No family history on file. History  Substance Use Topics  . Smoking status: Never Smoker   . Smokeless tobacco: Not on file  . Alcohol Use: No   OB History   Grav Para Term Preterm Abortions TAB SAB Ect Mult Living                 Review of Systems At least 10pt or greater review of systems completed and are negative except where specified in the HPI.  Allergies  Iodinated diagnostic agents  Home Medications   Current Outpatient Rx  Name  Route  Sig  Dispense  Refill  . benzonatate (TESSALON) 100 MG capsule   Oral   Take 100 mg by mouth 3 (three) times daily as needed for cough.         . DULoxetine (CYMBALTA) 60 MG capsule   Oral   Take 60 mg by mouth daily.         Marland Kitchen ezetimibe-simvastatin (VYTORIN) 10-20 MG per tablet   Oral   Take 1 tablet by mouth at bedtime.         .  hydrochlorothiazide (MICROZIDE) 12.5 MG capsule   Oral   Take 12.5 mg by mouth daily.         Marland Kitchen lamoTRIgine (LAMICTAL) 200 MG tablet   Oral   Take 200 mg by mouth 2 (two) times daily. 400 mg bedtime 200mg  am         . loratadine (CLARITIN) 10 MG tablet   Oral   Take 10 mg by mouth daily.         Marland Kitchen PHENobarbital (LUMINAL) 32.4 MG tablet   Oral   Take 162 mg by mouth at bedtime.           BP 197/91  Pulse 93  Temp(Src) 98.7 F (37.1 C) (Oral)  Resp 16  Ht 5\' 3"  (1.6 m)  Wt 200 lb (90.719 kg)  BMI 35.44 kg/m2  SpO2 94% Physical Exam  Nursing notes reviewed.  Electronic medical record reviewed. VITAL SIGNS:   Filed Vitals:   08/24/12 2249 08/24/12 2345 08/25/12 0008 08/25/12 0214  BP: 197/91  201/95 158/96  Pulse: 93  91 83  Temp: 98.7 F (37.1 C)  98.4 F (36.9 C) 98.4 F (36.9 C)  TempSrc:  Oral  Oral Oral  Resp: 16  89 20  Height: 5\' 3"  (1.6 m)     Weight: 200 lb (90.719 kg)     SpO2: 94% 94% 93% 94%   CONSTITUTIONAL: Awake, oriented, appears non-toxic HENT: Atraumatic, normocephalic, oral mucosa pink and moist, airway patent. Nares patent without drainage. External ears normal. EYES: Conjunctiva clear, EOMI, PERRLA NECK: Trachea midline, non-tender, supple CARDIOVASCULAR: Normal heart rate, Normal rhythm, No murmurs, rubs, gallops PULMONARY/CHEST: Clear to auscultation, no rhonchi, wheezes, or rales. Symmetrical breath sounds. Non-tender. ABDOMINAL: Non-distended, soft, non-tender - no rebound or guarding.  BS normal. NEUROLOGIC: Non-focal, moving all four extremities, no gross sensory or motor deficits. EXTREMITIES: No clubbing, cyanosis, or edema SKIN: Warm, Dry, No erythema, No rash  ED Course   Procedures (including critical care time)  Date: 08/25/2012  Rate: 82  Rhythm: normal sinus rhythm  QRS Axis: normal  Intervals: normal  ST/T Wave abnormalities: normal  Conduction Disutrbances: First degree AV block  Narrative Interpretation:  Possible left atrial abnormality, right bundle branch block, first degree AV block  Labs Reviewed  GLUCOSE, CAPILLARY - Abnormal; Notable for the following:    Glucose-Capillary 126 (*)    All other components within normal limits  BASIC METABOLIC PANEL - Abnormal; Notable for the following:    CO2 33 (*)    Glucose, Bld 128 (*)    All other components within normal limits  URINALYSIS, ROUTINE W REFLEX MICROSCOPIC - Abnormal; Notable for the following:    APPearance HAZY (*)    All other components within normal limits  CBC WITH DIFFERENTIAL  PHENOBARBITAL LEVEL  POCT I-STAT TROPONIN I   No results found. 1. Hypertension   2. Dizzy   3. H/O absence seizures     MDM  Tammy Rodgers is a 52 y.o. female patient presenting with multiple complaints.  Workup is unremarkable. No evidence for hypertensive emergency. Phenobarbital level normal. Patient to followup with her neurologist if she has continued absence seizures. No Neurologic deficits.  Do not think this patient is having a TIA, CVA, or evidence for heart damage, kidney damage. Patient's blood pressure has come down to 156 systolic 1 emergency department, I told her that she should start taking 25 mg of hydrochlorothiazide since she just recently started at 12.5 and has not followed up with primary care physician yet. Patient follow up with primary care doctor Hill.  The patient appears reasonably screened and/or stabilized for discharge and I doubt any other medical condition or other [facility] requiring further screening, evaluation, or treatment in the ED exists or is present at this time prior to discharge.     Jones Skene, MD 08/25/12 [phone]

## 2012-08-24 NOTE — ED Notes (Signed)
Pt states that she has not felt well since Sunday; pt reports feeling shakey especially to her hands; generalized weakness; pt states that when she stands or moves she feels a wave a nausea and feels like she is going to pass out; pt denies pain; pt states that her blood sugar has been elevated; family reports 176 around 2145 this pm; pt states "I just don't feel right"

## 2012-08-24 NOTE — ED Notes (Signed)
Per pt and her family pt has a history of epilectic seizures.  Pt takes medication for said.  Pt's family has stated that she has had two small seizure this evening while in the department.  Pt's seizures are a blank stare and she becomes unresponsive to verbal stimuli. Pt initially came for hypertension.  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

## 2012-08-25 LAB — CBC WITH DIFFERENTIAL/PLATELET
Basophils Absolute: 0 10*3/uL (ref 0.0–0.1)
Eosinophils Relative: 1 % (ref 0–5)
HCT: 42.9 % (ref 36.0–46.0)
Lymphocytes Relative: 42 % (ref 12–46)
Lymphs Abs: 2 10*3/uL (ref 0.7–4.0)
MCV: 91.7 fL (ref 78.0–100.0)
Monocytes Absolute: 0.4 10*3/uL (ref 0.1–1.0)
Neutro Abs: 2.2 10*3/uL (ref 1.7–7.7)
RBC: 4.68 MIL/uL (ref 3.87–5.11)
WBC: 4.7 10*3/uL (ref 4.0–10.5)

## 2012-08-25 LAB — BASIC METABOLIC PANEL
CO2: 33 mEq/L — ABNORMAL HIGH (ref 19–32)
Calcium: 10.4 mg/dL (ref 8.4–10.5)
Chloride: 96 mEq/L (ref 96–112)
Glucose, Bld: 128 mg/dL — ABNORMAL HIGH (ref 70–99)
Sodium: 138 mEq/L (ref 135–145)

## 2012-08-25 LAB — URINALYSIS, ROUTINE W REFLEX MICROSCOPIC
Glucose, UA: NEGATIVE mg/dL
Hgb urine dipstick: NEGATIVE
Leukocytes, UA: NEGATIVE
Protein, ur: NEGATIVE mg/dL
pH: 7.5 (ref 5.0–8.0)

## 2012-08-25 LAB — POCT I-STAT TROPONIN I: Troponin i, poc: 0.01 ng/mL (ref 0.00–0.08)

## 2012-08-25 LAB — PHENOBARBITAL LEVEL: Phenobarbital: 29.3 ug/mL (ref 15.0–40.0)

## 2012-08-25 MED ORDER — HYDROCHLOROTHIAZIDE 12.5 MG PO CAPS: 25.0000 mg | ORAL_CAPSULE | Freq: Every day | ORAL | Status: DC

## 2012-09-10 ENCOUNTER — Encounter (INDEPENDENT_AMBULATORY_CARE_PROVIDER_SITE_OTHER): Payer: Self-pay | Admitting: Surgery

## 2012-09-16 ENCOUNTER — Ambulatory Visit (INDEPENDENT_AMBULATORY_CARE_PROVIDER_SITE_OTHER): Admitting: Surgery

## 2012-09-27 ENCOUNTER — Ambulatory Visit (INDEPENDENT_AMBULATORY_CARE_PROVIDER_SITE_OTHER): Admitting: Surgery

## 2012-09-27 ENCOUNTER — Other Ambulatory Visit (INDEPENDENT_AMBULATORY_CARE_PROVIDER_SITE_OTHER): Payer: Self-pay | Admitting: Surgery

## 2012-09-27 ENCOUNTER — Encounter (INDEPENDENT_AMBULATORY_CARE_PROVIDER_SITE_OTHER): Payer: Self-pay | Admitting: Surgery

## 2012-09-27 VITALS — BP 116/72 | HR 72 | Temp 98.1°F | Resp 14 | Ht 63.0 in | Wt 201.0 lb

## 2012-09-27 DIAGNOSIS — N6459 Other signs and symptoms in breast: Secondary | ICD-10-CM

## 2012-09-27 DIAGNOSIS — N6452 Nipple discharge: Secondary | ICD-10-CM

## 2012-09-27 NOTE — Progress Notes (Signed)
Patient ID: Tammy Rodgers, female   DOB: 1960/10/01, 52 y.o.   MRN: [id number]  Chief Complaint  Patient presents with  . New Evaluation    eval lft br nipple discharge    HPI Tammy Rodgers is a 52 y.o. female.   HPI This is a very pleasant female referred to me by Dr. Parke Simmers for evaluation of bloody nipple discharge. This has been occurring spontaneously for many months. She mostly finds it in the morning after laying on the breast at night. She had mammograms in July of this year which were unremarkable. She has no previous history of problems with her breast.  She is otherwise without complaints Past Medical History  Diagnosis Date  . Seizure disorder   . High cholesterol   . Hypertension   . Seizures   . Neuromuscular disorder   . Thyroid disease     Past Surgical History  Procedure Laterality Date  . Abdominal hysterectomy    . Cholecystectomy    . Parathyroidectomy      History reviewed. No pertinent family history.  Social History History  Substance Use Topics  . Smoking status: Never Smoker   . Smokeless tobacco: Not on file  . Alcohol Use: No    Allergies  Allergen Reactions  . Iodinated Diagnostic Agents Other (See Comments)    High  Blood pressure and fever    Current Outpatient Prescriptions  Medication Sig Dispense Refill  . aspirin 81 MG tablet Take 81 mg by mouth daily.      . DULoxetine (CYMBALTA) 60 MG capsule Take 60 mg by mouth daily.      Marland Kitchen ezetimibe-simvastatin (VYTORIN) 10-20 MG per tablet Take 1 tablet by mouth at bedtime.      . lamoTRIgine (LAMICTAL) 200 MG tablet Take 200 mg by mouth 2 (two) times daily. 400 mg bedtime 200mg  am      . Olmesartan-Amlodipine-HCTZ (TRIBENZOR) 20-5-12.5 MG TABS Take by mouth.      Marland Kitchen PHENobarbital (LUMINAL) 32.4 MG tablet Take 162 mg by mouth at bedtime.        No current facility-administered medications for this visit.    Review of Systems Review of Systems  Constitutional: Negative for fever, chills  and unexpected weight change.  HENT: Negative for hearing loss, congestion, sore throat, trouble swallowing and voice change.   Eyes: Negative for visual disturbance.  Respiratory: Negative for cough and wheezing.   Cardiovascular: Negative for chest pain, palpitations and leg swelling.  Gastrointestinal: Negative for nausea, vomiting, abdominal pain, diarrhea, constipation, blood in stool, abdominal distention and anal bleeding.  Genitourinary: Negative for hematuria, vaginal bleeding and difficulty urinating.  Musculoskeletal: Negative for arthralgias.  Skin: Negative for rash and wound.  Neurological: Negative for seizures, syncope and headaches.  Hematological: Negative for adenopathy. Does not bruise/bleed easily.  Psychiatric/Behavioral: Negative for confusion.    Blood pressure 116/72, pulse 72, temperature 98.1 F (36.7 C), temperature source Temporal, resp. rate 14, height 5\' 3"  (1.6 m), weight 201 lb (91.173 kg).  Physical Exam Physical Exam  Constitutional: She is oriented to person, place, and time. She appears well-developed and well-nourished. No distress.  HENT:  Head: Normocephalic and atraumatic.  Right Ear: External ear normal.  Left Ear: External ear normal.  Nose: Nose normal.  Mouth/Throat: Oropharynx is clear and moist. No oropharyngeal exudate.  Eyes: Conjunctivae are normal. Pupils are equal, round, and reactive to light. Right eye exhibits no discharge. Left eye exhibits no discharge. No scleral icterus.  Neck:  Normal range of motion. Neck supple. No tracheal deviation present. No thyromegaly present.  Cardiovascular: Normal rate, regular rhythm, normal heart sounds and intact distal pulses.   No murmur heard. Pulmonary/Chest: Effort normal and breath sounds normal. No respiratory distress. She has no wheezes. She has no rales.  Musculoskeletal: Normal range of motion. She exhibits no edema and no tenderness.  Lymphadenopathy:    She has no cervical  adenopathy.    She has no axillary adenopathy.  Neurological: She is alert and oriented to person, place, and time.  Skin: Skin is warm and dry. No rash noted. She is not diaphoretic. No erythema.  Psychiatric: Her behavior is normal. Judgment normal.  Breasts: There are no palpable masses. Areola and nipple toward normal. I could not elicit any discharge from the left nipple on palpation today  Data Reviewed I have reviewed the mammograms from July which were unremarkable  Assessment    Bloody left nipple discharge     Plan    I believe a ductogram is warranted to see if there is some cause of the bloody nipple discharge to rule out malignancy. We will order this and I'll see her back after the ductogram is complete        Redford Behrle A 09/27/2012, 3:58 PM

## 2012-09-28 ENCOUNTER — Encounter (INDEPENDENT_AMBULATORY_CARE_PROVIDER_SITE_OTHER): Payer: Self-pay

## 2012-10-08 ENCOUNTER — Ambulatory Visit
Admission: RE | Admit: 2012-10-08 | Discharge: 2012-10-08 | Disposition: A | Discharge status: Home / Self Care | Source: Ambulatory Visit | Attending: Surgery | Admitting: Surgery

## 2012-10-08 ENCOUNTER — Other Ambulatory Visit (INDEPENDENT_AMBULATORY_CARE_PROVIDER_SITE_OTHER): Payer: Self-pay | Admitting: Surgery

## 2012-10-08 DIAGNOSIS — N6452 Nipple discharge: Secondary | ICD-10-CM

## 2012-10-13 ENCOUNTER — Encounter (INDEPENDENT_AMBULATORY_CARE_PROVIDER_SITE_OTHER): Admitting: Surgery

## 2012-10-28 ENCOUNTER — Ambulatory Visit (INDEPENDENT_AMBULATORY_CARE_PROVIDER_SITE_OTHER): Admitting: Internal Medicine

## 2012-10-28 ENCOUNTER — Encounter: Payer: Self-pay | Admitting: *Deleted

## 2012-10-28 ENCOUNTER — Encounter: Payer: Self-pay | Admitting: Internal Medicine

## 2012-10-28 VITALS — BP 132/74 | HR 80 | Ht 63.0 in | Wt 199.8 lb

## 2012-10-28 DIAGNOSIS — G4733 Obstructive sleep apnea (adult) (pediatric): Secondary | ICD-10-CM

## 2012-10-28 NOTE — Progress Notes (Signed)
10/28/12- 51 yoF never smoker referred courtesy of Dr Danelle Earthly sleep study at [facility] clinic about 1.5 years ago.  Onset in last few years of loud snore, irresistible daytime sleepiness. Current CPAP/ [facility] not helping.  Home unattended sleep study-SNAP-04/16/11- WNL- AHI 0.2/ hr, weight 190 lbs. No opportunity to nap. Bedtime 11:30 PM, short sleep latency, waking once before up at 6:30 AM.. Hx complex partial seizures controlled long-term with chronic phenobarbital 30 mg and also on Lamictal x decades.  Hx parathyroid surgery. Still has tonsils.  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  Mother snores.  Gets flu vax from PCP.  Prior to Admission medications   Medication Sig Start Date End Date Taking? Authorizing Provider  amLODipine (NORVASC) 5 MG tablet Take 5 mg by mouth daily.   Yes Historical Provider, MD  aspirin 81 MG tablet Take 81 mg by mouth daily.   Yes Historical Provider, MD  DULoxetine (CYMBALTA) 60 MG capsule Take 60 mg by mouth daily.   Yes Historical Provider, MD  ezetimibe-simvastatin (VYTORIN) 10-20 MG per tablet Take 1 tablet by mouth at bedtime.   Yes Historical Provider, MD  lamoTRIgine (LAMICTAL) 200 MG tablet 200mg  in morning, 200mg  at lunch, and 400mg  in evening,   Yes Historical Provider, MD  Olmesartan-Amlodipine-HCTZ (TRIBENZOR) 20-5-12.5 MG TABS Take by mouth.   Yes Historical Provider, MD  PHENObarbital (LUMINAL) 30 MG tablet Take 60 mg by mouth. 180 qhs   Yes Historical Provider, MD   Past Medical History  Diagnosis Date  . Seizure disorder   . High cholesterol   . Hypertension   . Seizures   . Neuromuscular disorder   . Thyroid disease    Past Surgical History  Procedure Laterality Date  . Abdominal hysterectomy    . Cholecystectomy    . Parathyroidectomy     Family History  Problem Relation Age of Onset  . Heart disease     History   Social History  . Marital Status: Married    Spouse Name: N/A    Number of Children: 2  . Years of Education:  N/A   Occupational History  . math teacher [employer]   Social History Main Topics  . Smoking status: Never Smoker   . Smokeless tobacco: Not on file  . Alcohol Use: No  . Drug Use: No  . Sexual Activity: Not on file   Other Topics Concern  . Not on file   Social History Narrative  . No narrative on file   ROS-see HPI Constitutional:   No-   weight loss, night sweats, fevers, chills, +fatigue, lassitude. HEENT:   No-  headaches, difficulty swallowing, tooth/dental problems, sore throat,       No-  sneezing, itching, ear ache, nasal congestion, post nasal drip,  CV:  No-   chest pain, orthopnea, PND, swelling in lower extremities, anasarca, dizziness, palpitations Resp: No-   shortness of breath with exertion or at rest.              No-   productive cough,  No non-productive cough,  No- coughing up of blood.              No-   change in color of mucus.  No- wheezing.   Skin: No-   rash or lesions. GI:  No-   heartburn, indigestion, abdominal pain, nausea, vomiting, diarrhea,                 change in bowel habits, loss of appetite GU:  No-   dysuria, change in color of urine, no urgency or frequency.  No- flank pain. MS:  No-   joint pain or swelling.  No- decreased range of motion.  No- back pain. Neuro-     nothing unusual Psych:  No- change in mood or affect. No depression or anxiety.  No memory loss.  OBJ- Physical Exam General- Alert, Oriented, Affect-appropriate, Distress- none acute, +overweight Skin- rash-none, lesions- none, excoriation- none Lymphadenopathy- none Head- atraumatic            Eyes- Gross vision intact, PERRLA, conjunctivae and secretions clear            Ears- Hearing, canals-normal            Nose- Clear, no-Septal dev, mucus, polyps, erosion, perforation             Throat- Mallampati III-IV , mucosa clear , drainage- none, tonsils- atrophic Neck- Full, trachea midline, no stridor , thyroid nl, carotid no bruit Chest - symmetrical  excursion , unlabored           Heart/CV- RRR , no murmur , no gallop  , no rub, nl s1 s2                           - JVD- none , edema- none, stasis changes- none, varices- none           Lung- clear to P&A, wheeze- none, cough- none , dullness-none, rub- none           Chest wall-  Abd- tender-no, distended-no, bowel sounds-present, HSM- no Br/ Gen/ Rectal- Not done, not indicated Extrem- cyanosis- none, clubbing, none, atrophy- none, strength- nl Neuro- grossly intact to observation

## 2012-10-28 NOTE — Patient Instructions (Signed)
See if you can't get a little more sleep- maybe go to bed a half hour earlier.  Ok to try otc caffeine tablets occasionally if needed  Order - DME  [facility] autotitrate CPAP 5-20 cwp x 7 days for pressure recommendation. Mask of choice, supplies, humidifier as needed    Dx OSA

## 2012-10-29 ENCOUNTER — Other Ambulatory Visit (INDEPENDENT_AMBULATORY_CARE_PROVIDER_SITE_OTHER): Payer: Self-pay | Admitting: Surgery

## 2012-10-29 ENCOUNTER — Ambulatory Visit
Admission: RE | Admit: 2012-10-29 | Discharge: 2012-10-29 | Disposition: A | Discharge status: Home / Self Care | Source: Ambulatory Visit | Attending: Surgery | Admitting: Surgery

## 2012-10-29 DIAGNOSIS — N6452 Nipple discharge: Secondary | ICD-10-CM

## 2012-10-29 DIAGNOSIS — N63 Unspecified lump in unspecified breast: Secondary | ICD-10-CM

## 2012-11-03 ENCOUNTER — Encounter (INDEPENDENT_AMBULATORY_CARE_PROVIDER_SITE_OTHER): Payer: Self-pay | Admitting: Surgery

## 2012-11-03 ENCOUNTER — Ambulatory Visit (INDEPENDENT_AMBULATORY_CARE_PROVIDER_SITE_OTHER): Admitting: Surgery

## 2012-11-03 VITALS — BP 122/64 | HR 68 | Temp 97.6°F | Resp 14 | Ht 63.0 in | Wt 198.8 lb

## 2012-11-03 DIAGNOSIS — D242 Benign neoplasm of left breast: Secondary | ICD-10-CM

## 2012-11-03 DIAGNOSIS — D249 Benign neoplasm of unspecified breast: Secondary | ICD-10-CM

## 2012-11-03 NOTE — Progress Notes (Signed)
Subjective:     Patient ID: Tammy Rodgers, female   DOB: 04-11-1960, 52 y.o.   MRN: [id number]  HPI She is here for followup. She has since had repeat mammograms and ultrasound and biopsy of a papilloma in the left breast. This is suspected to be the cause of the bloody nipple discharge  Review of Systems     Objective:   Physical Exam    On exam, the left breast incision is healing well  Assessment:     Left breast papilloma     Plan:     Needle localized lumpectomy is recommended for histologic evaluation. She is in agreement. Surgery will be scheduled

## 2012-11-13 DIAGNOSIS — D242 Benign neoplasm of left breast: Secondary | ICD-10-CM

## 2012-11-13 HISTORY — DX: Benign neoplasm of left breast: D24.2

## 2012-11-14 DIAGNOSIS — G4733 Obstructive sleep apnea (adult) (pediatric): Secondary | ICD-10-CM | POA: Insufficient documentation

## 2012-11-14 NOTE — Assessment & Plan Note (Signed)
Needs to get more sleep. Discussed light use of caffeine.  Available sleep study was accepted for provision of CPAP by [facility], so we will work with that.  Plan- autotitrate for pressure recommendation. Consider formal sleep study

## 2012-11-18 ENCOUNTER — Encounter (HOSPITAL_BASED_OUTPATIENT_CLINIC_OR_DEPARTMENT_OTHER): Payer: Self-pay | Admitting: *Deleted

## 2012-11-18 NOTE — Pre-Procedure Instructions (Signed)
To come for BMET; office note req. from [facility], [facility]

## 2012-11-23 ENCOUNTER — Encounter (HOSPITAL_BASED_OUTPATIENT_CLINIC_OR_DEPARTMENT_OTHER)
Admission: RE | Admit: 2012-11-23 | Discharge: 2012-11-23 | Disposition: A | Discharge status: Home / Self Care | Source: Ambulatory Visit | Attending: Surgery | Admitting: Surgery

## 2012-11-23 LAB — BASIC METABOLIC PANEL
BUN: 16 mg/dL (ref 6–23)
Calcium: 9.8 mg/dL (ref 8.4–10.5)
Creatinine, Ser: 0.73 mg/dL (ref 0.50–1.10)
GFR calc Af Amer: >90 mL/min (ref >90)

## 2012-11-24 ENCOUNTER — Encounter (HOSPITAL_BASED_OUTPATIENT_CLINIC_OR_DEPARTMENT_OTHER): Admitting: Certified Registered Nurse Anesthetist

## 2012-11-24 ENCOUNTER — Encounter (HOSPITAL_BASED_OUTPATIENT_CLINIC_OR_DEPARTMENT_OTHER): Payer: Self-pay | Admitting: Certified Registered Nurse Anesthetist

## 2012-11-24 ENCOUNTER — Encounter (HOSPITAL_BASED_OUTPATIENT_CLINIC_OR_DEPARTMENT_OTHER)
Admission: RE | Disposition: A | Discharge status: Home / Self Care | Payer: Self-pay | Source: Ambulatory Visit | Attending: Surgery

## 2012-11-24 ENCOUNTER — Ambulatory Visit (HOSPITAL_BASED_OUTPATIENT_CLINIC_OR_DEPARTMENT_OTHER): Admitting: Certified Registered Nurse Anesthetist

## 2012-11-24 ENCOUNTER — Ambulatory Visit (HOSPITAL_BASED_OUTPATIENT_CLINIC_OR_DEPARTMENT_OTHER)
Admission: RE | Admit: 2012-11-24 | Discharge: 2012-11-24 | Disposition: A | Discharge status: Home / Self Care | Source: Ambulatory Visit | Attending: Surgery | Admitting: Surgery

## 2012-11-24 ENCOUNTER — Ambulatory Visit
Admission: RE | Admit: 2012-11-24 | Discharge: 2012-11-24 | Disposition: A | Discharge status: Home / Self Care | Source: Ambulatory Visit | Attending: Surgery | Admitting: Surgery

## 2012-11-24 DIAGNOSIS — D249 Benign neoplasm of unspecified breast: Secondary | ICD-10-CM | POA: Insufficient documentation

## 2012-11-24 DIAGNOSIS — E079 Disorder of thyroid, unspecified: Secondary | ICD-10-CM | POA: Insufficient documentation

## 2012-11-24 DIAGNOSIS — D242 Benign neoplasm of left breast: Secondary | ICD-10-CM

## 2012-11-24 DIAGNOSIS — N6459 Other signs and symptoms in breast: Secondary | ICD-10-CM | POA: Insufficient documentation

## 2012-11-24 DIAGNOSIS — R569 Unspecified convulsions: Secondary | ICD-10-CM | POA: Insufficient documentation

## 2012-11-24 DIAGNOSIS — E7800 Pure hypercholesterolemia, unspecified: Secondary | ICD-10-CM | POA: Insufficient documentation

## 2012-11-24 DIAGNOSIS — E669 Obesity, unspecified: Secondary | ICD-10-CM | POA: Insufficient documentation

## 2012-11-24 DIAGNOSIS — I10 Essential (primary) hypertension: Secondary | ICD-10-CM | POA: Insufficient documentation

## 2012-11-24 HISTORY — PX: BREAST LUMPECTOMY WITH NEEDLE LOCALIZATION: SHX5759

## 2012-11-24 HISTORY — DX: Localization-related (focal) (partial) symptomatic epilepsy and epileptic syndromes with complex partial seizures, not intractable, without status epilepticus: G40.209

## 2012-11-24 HISTORY — DX: Sleep apnea, unspecified: G47.30

## 2012-11-24 HISTORY — DX: Benign neoplasm of left breast: D24.2

## 2012-11-24 SURGERY — BREAST LUMPECTOMY WITH NEEDLE LOCALIZATION
Anesthesia: General | Site: Breast | Laterality: Left | Wound class: Clean

## 2012-11-24 MED ORDER — ONDANSETRON HCL 4 MG/2ML IJ SOLN: 4.0000 mg | Freq: Four times a day (QID) | INTRAMUSCULAR | Status: DC | PRN

## 2012-11-24 MED ORDER — FENTANYL CITRATE 0.05 MG/ML IJ SOLN
INTRAMUSCULAR | Status: DC | PRN
  Administered 2012-11-24 (×2): 50 ug via INTRAVENOUS

## 2012-11-24 MED ORDER — FENTANYL CITRATE 0.05 MG/ML IJ SOLN
INTRAMUSCULAR | Status: AC
  Filled 2012-11-24: qty 6

## 2012-11-24 MED ORDER — HYDROCODONE-ACETAMINOPHEN 5-325 MG PO TABS: 1.0000 | ORAL_TABLET | ORAL | Status: DC | PRN

## 2012-11-24 MED ORDER — LACTATED RINGERS IV SOLN
INTRAVENOUS | Status: DC
  Administered 2012-11-24 (×2): via INTRAVENOUS

## 2012-11-24 MED ORDER — LIDOCAINE HCL (CARDIAC) 20 MG/ML IV SOLN
INTRAVENOUS | Status: DC | PRN
  Administered 2012-11-24: 60 mg via INTRAVENOUS

## 2012-11-24 MED ORDER — MIDAZOLAM HCL 2 MG/2ML IJ SOLN: 1.0000 mg | INTRAMUSCULAR | Status: DC | PRN

## 2012-11-24 MED ORDER — OXYCODONE HCL 5 MG PO TABS: 5.0000 mg | ORAL_TABLET | Freq: Once | ORAL | Status: DC | PRN

## 2012-11-24 MED ORDER — MIDAZOLAM HCL 2 MG/2ML IJ SOLN
INTRAMUSCULAR | Status: AC
  Filled 2012-11-24: qty 2

## 2012-11-24 MED ORDER — MIDAZOLAM HCL 5 MG/5ML IJ SOLN
INTRAMUSCULAR | Status: DC | PRN
  Administered 2012-11-24: 2 mg via INTRAVENOUS

## 2012-11-24 MED ORDER — DEXAMETHASONE SODIUM PHOSPHATE 4 MG/ML IJ SOLN
INTRAMUSCULAR | Status: DC | PRN
  Administered 2012-11-24: 5 mg via INTRAVENOUS

## 2012-11-24 MED ORDER — CEFAZOLIN SODIUM-DEXTROSE 2-3 GM-% IV SOLR
INTRAVENOUS | Status: AC
  Filled 2012-11-24: qty 50

## 2012-11-24 MED ORDER — BUPIVACAINE-EPINEPHRINE 0.5% -1:200000 IJ SOLN
INTRAMUSCULAR | Status: DC | PRN
  Administered 2012-11-24: 19 mL

## 2012-11-24 MED ORDER — HYDROMORPHONE HCL PF 1 MG/ML IJ SOLN: 0.2500 mg | INTRAMUSCULAR | Status: DC | PRN

## 2012-11-24 MED ORDER — MIDAZOLAM HCL 2 MG/ML PO SYRP: 12.0000 mg | ORAL_SOLUTION | Freq: Once | ORAL | Status: DC | PRN

## 2012-11-24 MED ORDER — PROPOFOL 10 MG/ML IV BOLUS
INTRAVENOUS | Status: DC | PRN
  Administered 2012-11-24: 200 mg via INTRAVENOUS

## 2012-11-24 MED ORDER — BUPIVACAINE-EPINEPHRINE PF 0.5-1:200000 % IJ SOLN
INTRAMUSCULAR | Status: AC
  Filled 2012-11-24: qty 30

## 2012-11-24 MED ORDER — OXYCODONE HCL 5 MG/5ML PO SOLN: 5.0000 mg | Freq: Once | ORAL | Status: DC | PRN

## 2012-11-24 MED ORDER — FENTANYL CITRATE 0.05 MG/ML IJ SOLN: 50.0000 ug | INTRAMUSCULAR | Status: DC | PRN

## 2012-11-24 MED ORDER — PROPOFOL 10 MG/ML IV EMUL
INTRAVENOUS | Status: AC
  Filled 2012-11-24: qty 50

## 2012-11-24 MED ORDER — CEFAZOLIN SODIUM-DEXTROSE 2-3 GM-% IV SOLR
2.0000 g | INTRAVENOUS | Status: AC
  Administered 2012-11-24: 2 g via INTRAVENOUS

## 2012-11-24 SURGICAL SUPPLY — 44 items
APL SKNCLS STERI-STRIP NONHPOA (GAUZE/BANDAGES/DRESSINGS) ×1
BENZOIN TINCTURE PRP APPL 2/3 (GAUZE/BANDAGES/DRESSINGS) ×2 IMPLANT
BLADE HEX COATED 2.75 (ELECTRODE) ×2 IMPLANT
BLADE SURG 15 STRL LF DISP TIS (BLADE) ×1 IMPLANT
BLADE SURG 15 STRL SS (BLADE) ×2
CANISTER SUCT 1200ML W/VALVE (MISCELLANEOUS) IMPLANT
CHLORAPREP W/TINT 26ML (MISCELLANEOUS) ×2 IMPLANT
CLIP TI WIDE RED SMALL 6 (CLIP) ×1 IMPLANT
COVER MAYO STAND STRL (DRAPES) ×2 IMPLANT
COVER TABLE BACK 60X90 (DRAPES) ×2 IMPLANT
DECANTER SPIKE VIAL GLASS SM (MISCELLANEOUS) IMPLANT
DEVICE DUBIN W/COMP PLATE 8390 (MISCELLANEOUS) ×1 IMPLANT
DRAPE PED LAPAROTOMY (DRAPES) ×2 IMPLANT
DRAPE UTILITY XL STRL (DRAPES) ×2 IMPLANT
DRSG TEGADERM 4X4.75 (GAUZE/BANDAGES/DRESSINGS) ×2 IMPLANT
ELECT REM PT RETURN 9FT ADLT (ELECTROSURGICAL) ×2
ELECTRODE REM PT RTRN 9FT ADLT (ELECTROSURGICAL) ×1 IMPLANT
GAUZE SPONGE 4X4 12PLY STRL LF (GAUZE/BANDAGES/DRESSINGS) ×2 IMPLANT
GLOVE BIOGEL PI IND STRL 6.5 (GLOVE) IMPLANT
GLOVE BIOGEL PI IND STRL 7.0 (GLOVE) IMPLANT
GLOVE BIOGEL PI INDICATOR 6.5 (GLOVE) ×1
GLOVE BIOGEL PI INDICATOR 7.0 (GLOVE) ×1
GLOVE EXAM NITRILE MD LF STRL (GLOVE) ×1 IMPLANT
GLOVE SURG SIGNA 7.5 PF LTX (GLOVE) ×2 IMPLANT
GOWN PREVENTION PLUS XLARGE (GOWN DISPOSABLE) ×2 IMPLANT
GOWN PREVENTION PLUS XXLARGE (GOWN DISPOSABLE) ×2 IMPLANT
KIT MARKER MARGIN INK (KITS) ×2 IMPLANT
NDL HYPO 25X1 1.5 SAFETY (NEEDLE) ×1 IMPLANT
NEEDLE HYPO 25X1 1.5 SAFETY (NEEDLE) ×2 IMPLANT
NS IRRIG 1000ML POUR BTL (IV SOLUTION) ×2 IMPLANT
PACK BASIN DAY SURGERY FS (CUSTOM PROCEDURE TRAY) ×2 IMPLANT
PENCIL BUTTON HOLSTER BLD 10FT (ELECTRODE) ×2 IMPLANT
SLEEVE SCD COMPRESS KNEE MED (MISCELLANEOUS) ×1 IMPLANT
SPONGE LAP 4X18 X RAY DECT (DISPOSABLE) ×2 IMPLANT
STRIP CLOSURE SKIN 1/2X4 (GAUZE/BANDAGES/DRESSINGS) ×2 IMPLANT
SUT MNCRL AB 4-0 PS2 18 (SUTURE) ×2 IMPLANT
SUT SILK 2 0 SH (SUTURE) ×2 IMPLANT
SUT VIC AB 3-0 SH 27 (SUTURE) ×2
SUT VIC AB 3-0 SH 27X BRD (SUTURE) ×1 IMPLANT
SYR CONTROL 10ML LL (SYRINGE) ×2 IMPLANT
TOWEL OR 17X24 6PK STRL BLUE (TOWEL DISPOSABLE) ×2 IMPLANT
TOWEL OR NON WOVEN STRL DISP B (DISPOSABLE) ×2 IMPLANT
TUBE CONNECTING 20X1/4 (TUBING) IMPLANT
YANKAUER SUCT BULB TIP NO VENT (SUCTIONS) IMPLANT

## 2012-11-24 NOTE — Transfer of Care (Signed)
Immediate Anesthesia Transfer of Care Note  Patient: Tammy Rodgers  Procedure(s) Performed: Procedure(s): LEFT BREAST LUMPECTOMY WITH NEEDLE LOCALIZATION (Left)  Patient Location: PACU  Anesthesia Type:General  Level of Consciousness: awake and patient cooperative  Airway & Oxygen Therapy: Patient Spontanous Breathing and Patient connected to face mask oxygen  Post-op Assessment: Report given to PACU RN and Post -op Vital signs reviewed and stable  Post vital signs: Reviewed and stable  Complications: No apparent anesthesia complications

## 2012-11-24 NOTE — Anesthesia Procedure Notes (Signed)
Procedure Name: LMA Insertion Performed by: Abigail Miyamoto A Pre-anesthesia Checklist: Patient identified, Emergency Drugs available, Suction available and Patient being monitored Patient Re-evaluated:Patient Re-evaluated prior to inductionOxygen Delivery Method: Circle System Utilized Preoxygenation: Pre-oxygenation with 100% oxygen Intubation Type: IV induction Ventilation: Mask ventilation without difficulty LMA: LMA inserted LMA Size: 4.0 Number of attempts: 1 Airway Equipment and Method: bite block Placement Confirmation: positive ETCO2 Tube secured with: Tape Dental Injury: Teeth and Oropharynx as per pre-operative assessment

## 2012-11-24 NOTE — Op Note (Signed)
LEFT BREAST LUMPECTOMY WITH NEEDLE LOCALIZATION  Procedure Note  Tammy Rodgers 11/24/2012   Pre-op Diagnosis: Left breast papilloma     Post-op Diagnosis: same  Procedure(s): LEFT BREAST LUMPECTOMY WITH NEEDLE LOCALIZATION  Surgeon(s): Shelly Rubenstein, MD  Anesthesia: General  Staff:  Circulator: Raliegh Scarlet, RN Scrub Person: Randalyn Rhea, RN  Estimated Blood Loss: Minimal               Specimens: sent to path          [facility]   Date: 11/24/2012  Time: 12:21 PM

## 2012-11-24 NOTE — H&P (Signed)
Patient ID: Tammy Rodgers, female DOB: 1961/01/02, 52 y.o. MRN: [id number]  Chief Complaint   Patient presents with   .  New Evaluation     eval lft br nipple discharge   HPI  Tammy Rodgers is a 52 y.o. female.  HPI  This is a very pleasant female referred to me by Dr. Parke Simmers for evaluation of bloody nipple discharge. This has been occurring spontaneously for many months. She mostly finds it in the morning after laying on the breast at night. Her mammograms are unremarkable. She has since had a ductogram was suggested a small lesion. Biopsy confirmed a papilloma. She now presents for needle localized lumpectomy.  Past Medical History   Diagnosis  Date   .  Seizure disorder    .  High cholesterol    .  Hypertension    .  Seizures    .  Neuromuscular disorder    .  Thyroid disease     Past Surgical History   Procedure  Laterality  Date   .  Abdominal hysterectomy     .  Cholecystectomy     .  Parathyroidectomy     History reviewed. No pertinent family history.  Social History  History   Substance Use Topics   .  Smoking status:  Never Smoker   .  Smokeless tobacco:  Not on file   .  Alcohol Use:  No    Allergies   Allergen  Reactions   .  Iodinated Diagnostic Agents  Other (See Comments)     High Blood pressure and fever    Current Outpatient Prescriptions   Medication  Sig  Dispense  Refill   .  aspirin 81 MG tablet  Take 81 mg by mouth daily.     .  DULoxetine (CYMBALTA) 60 MG capsule  Take 60 mg by mouth daily.     Marland Kitchen  ezetimibe-simvastatin (VYTORIN) 10-20 MG per tablet  Take 1 tablet by mouth at bedtime.     .  lamoTRIgine (LAMICTAL) 200 MG tablet  Take 200 mg by mouth 2 (two) times daily. 400 mg bedtime 200mg  am     .  Olmesartan-Amlodipine-HCTZ (TRIBENZOR) 20-5-12.5 MG TABS  Take by mouth.     Marland Kitchen  PHENobarbital (LUMINAL) 32.4 MG tablet  Take 162 mg by mouth at bedtime.      No current facility-administered medications for this visit.   Review of Systems  Review  of Systems  Constitutional: Negative for fever, chills and unexpected weight change.  HENT: Negative for hearing loss, congestion, sore throat, trouble swallowing and voice change.  Eyes: Negative for visual disturbance.  Respiratory: Negative for cough and wheezing.  Cardiovascular: Negative for chest pain, palpitations and leg swelling.  Gastrointestinal: Negative for nausea, vomiting, abdominal pain, diarrhea, constipation, blood in stool, abdominal distention and anal bleeding.  Genitourinary: Negative for hematuria, vaginal bleeding and difficulty urinating.  Musculoskeletal: Negative for arthralgias.  Skin: Negative for rash and wound.  Neurological: Negative for seizures, syncope and headaches.  Hematological: Negative for adenopathy. Does not bruise/bleed easily.  Psychiatric/Behavioral: Negative for confusion.  Blood pressure 116/72, pulse 72, temperature 98.1 F (36.7 C), temperature source Temporal, resp. rate 14, height 5\' 3"  (1.6 m), weight 201 lb (91.173 kg).  Physical Exam  Physical Exam  Constitutional: She is oriented to person, place, and time. She appears well-developed and well-nourished. No distress.  HENT:  Head: Normocephalic and atraumatic.  Right Ear: External ear normal.  Left Ear: External  ear normal.  Nose: Nose normal.  Mouth/Throat: Oropharynx is clear and moist. No oropharyngeal exudate.  Eyes: Conjunctivae are normal. Pupils are equal, round, and reactive to light. Right eye exhibits no discharge. Left eye exhibits no discharge. No scleral icterus.  Neck: Normal range of motion. Neck supple. No tracheal deviation present. No thyromegaly present.  Cardiovascular: Normal rate, regular rhythm, normal heart sounds and intact distal pulses.  No murmur heard.  Pulmonary/Chest: Effort normal and breath sounds normal. No respiratory distress. She has no wheezes. She has no rales.  Musculoskeletal: Normal range of motion. She exhibits no edema and no tenderness.   Lymphadenopathy:  She has no cervical adenopathy.  She has no axillary adenopathy.  Neurological: She is alert and oriented to person, place, and time.  Skin: Skin is warm and dry. No rash noted. She is not diaphoretic. No erythema.  Psychiatric: Her behavior is normal. Judgment normal.  Breasts: There are no palpable masses. Areola and nipple toward normal. I could not elicit any discharge from the left nipple on palpation today  Data Reviewed  I have reviewed the mammograms from July which were unremarkable  Assessment  Bloody left nipple discharge From left breast papilloma Plan  I will now proceed with a needle localized left breast lumpectomy. I discussed the risks which includes but is not limited to bleeding, infection, need for further surgery should malignancy be present, injury to shredding structures, et Karie Soda. She understands and wishes to proceed. Surgery is scheduled

## 2012-11-24 NOTE — Anesthesia Postprocedure Evaluation (Signed)
Anesthesia Post Note  Patient: Tammy Rodgers  Procedure(s) Performed: Procedure(s) (LRB): LEFT BREAST LUMPECTOMY WITH NEEDLE LOCALIZATION (Left)  Anesthesia type: General  Patient location: PACU  Post pain: Pain level controlled and Adequate analgesia  Post assessment: Post-op Vital signs reviewed, Patient's Cardiovascular Status Stable, Respiratory Function Stable, Patent Airway and Pain level controlled  Last Vitals:  Filed Vitals:   11/24/12 1255  BP: 144/78  Pulse: 69  Temp:   Resp: 15    Post vital signs: Reviewed and stable  Level of consciousness: awake, alert  and oriented  Complications: No apparent anesthesia complications

## 2012-11-24 NOTE — Anesthesia Preprocedure Evaluation (Addendum)
Anesthesia Evaluation  Patient identified by MRN, date of birth, ID band Patient awake    Reviewed: Allergy & Precautions, H&P , NPO status , Patient's Chart, lab work & pertinent test results  Airway Mallampati: II  Neck ROM: full    Dental   Pulmonary sleep apnea ,          Cardiovascular hypertension, Pt. on medications     Neuro/Psych Seizures -,     GI/Hepatic   Endo/Other  obese  Renal/GU      Musculoskeletal   Abdominal   Peds  Hematology   Anesthesia Other Findings   Reproductive/Obstetrics                         Anesthesia Physical Anesthesia Plan  ASA: II  Anesthesia Plan: General   Post-op Pain Management:    Induction: Intravenous  Airway Management Planned: LMA  Additional Equipment:   Intra-op Plan:   Post-operative Plan:   Informed Consent: I have reviewed the patients History and Physical, chart, labs and discussed the procedure including the risks, benefits and alternatives for the proposed anesthesia with the patient or authorized representative who has indicated his/her understanding and acceptance.     Plan Discussed with: CRNA, Anesthesiologist and Surgeon  Anesthesia Plan Comments:         Anesthesia Quick Evaluation

## 2012-11-25 ENCOUNTER — Encounter (HOSPITAL_BASED_OUTPATIENT_CLINIC_OR_DEPARTMENT_OTHER): Payer: Self-pay | Admitting: Surgery

## 2012-11-26 NOTE — Op Note (Signed)
NAMEMELENDA, BIELAK               ACCOUNT NO.:  [id number]  MEDICAL RECORD NO.:  [id number]  LOCATION:  WA03                         FACILITY:  [facility]  PHYSICIAN:  Abigail Miyamoto, M.D. DATE OF BIRTH:  [birth year]  DATE OF PROCEDURE:  11/24/2012 DATE OF DISCHARGE:  11/24/2012                              OPERATIVE REPORT   PREOPERATIVE DIAGNOSIS:  Left breast papilloma with nipple discharge.  POSTOPERATIVE DIAGNOSIS:  Left breast papilloma with nipple discharge.  PROCEDURE:  Needle localized left breast lumpectomy.  SURGEON:  Abigail Miyamoto, M.D.  ANESTHESIA:  General and 0.5% Marcaine.  ESTIMATED BLOOD LOSS:  Minimal.  INDICATIONS:  This is a 52 year old female who was found to have left breast nipple discharge.  Original mammograms were inconclusive.  She was going to go for a ductogram, but then followup mammogram showed a possible lesion in the ductal confirmed on ultrasound.  Biopsy proved this to be a papilloma.  Decision is made to proceed with needle localized lumpectomy.  PROCEDURE IN DETAIL:  The patient has gone to the [facility], had a localization wire placed in the left breast to the area of concern, which had been marked with a marker on previous biopsy.  She was taken to the operating room, placed supine on the operating room table. General anesthesia was induced.  The bandage was removed and the wire was identified.  Her left breast was then prepped and draped in usual sterile fashion.  I made a small incision around the areola at the lower edge after anesthetizing with Marcaine.  I took this down to the breast tissue with electrocautery.  I was then able to dissect inferiorly to the area of the localization wire and pulled it into the wound.  I then performed a wide lumpectomy removing all tissue around localization wire.  There were several dilated ducts, which were identified.  Once the specimen was completely removed, x-ray confirmed the suspicious  area was well within the center of the biopsy specimen.  The specimen was then sent to Pathology for evaluation.  I achieved hemostasis with the cautery.  I anesthetized the wound more with Marcaine.  I then closed subcutaneous tissue with interrupted 3-0 Vicryl sutures and closed the skin with a running 4-0 Monocryl.  Steri-Strips, gauze, and Tegaderm were then applied.  The patient tolerated the procedure well.  All the counts were correct at the end of the procedure.  The patient was then extubated in the operating room and taken in stable condition to recovery room.     Abigail Miyamoto, M.D.     DB/MEDQ  D:  11/24/2012  T:  11/25/2012  Job:  [id number]

## 2012-11-26 NOTE — Op Note (Deleted)
Tammy Rodgers, Tammy Rodgers               ACCOUNT NO.:  [id number]  MEDICAL RECORD NO.:  [id number]  LOCATION:  WA03                         FACILITY:  [facility]  PHYSICIAN:  Abigail Miyamoto, M.D. DATE OF BIRTH:  1960/02/12  DATE OF PROCEDURE:  11/24/2012 DATE OF DISCHARGE:  08/25/2012                              OPERATIVE REPORT   PREOPERATIVE DIAGNOSIS:  Left breast papilloma with nipple discharge.  POSTOPERATIVE DIAGNOSIS:  Left breast papilloma with nipple discharge.  PROCEDURE:  Needle localized left breast lumpectomy.  SURGEON:  Abigail Miyamoto, M.D.  ANESTHESIA:  General and 0.5% Marcaine.  ESTIMATED BLOOD LOSS:  Minimal.  INDICATIONS:  This is a 52 year old female who was found to have left breast nipple discharge.  Original mammograms were inconclusive.  She was going to go for a ductogram, but then followup mammogram showed a possible lesion in the ductal confirmed on ultrasound.  Biopsy proved this to be a papilloma.  Decision is made to proceed with needle localized lumpectomy.  PROCEDURE IN DETAIL:  The patient has gone to the Breast Center, had a localization wire placed in the left breast to the area of concern, which had been marked with a marker on previous biopsy.  She was taken to the operating room, placed supine on the operating room table. General anesthesia was induced.  The bandage was removed and the wire was identified.  Her left breast was then prepped and draped in usual sterile fashion.  I made a small incision around the areola at the lower edge after anesthetizing with Marcaine.  I took this down to the breast tissue with electrocautery.  I was then able to dissect inferiorly to the area of the localization wire and pulled it into the wound.  I then performed a wide lumpectomy removing all tissue around localization wire.  There were several dilated ducts, which were identified.  Once the specimen was completely removed, x-ray confirmed the suspicious  area was well within the center of the biopsy specimen.  The specimen was then sent to Pathology for evaluation.  I achieved hemostasis with the cautery.  I anesthetized the wound more with Marcaine.  I then closed subcutaneous tissue with interrupted 3-0 Vicryl sutures and closed the skin with a running 4-0 Monocryl.  Steri-Strips, gauze, and Tegaderm were then applied.  The patient tolerated the procedure well.  All the counts were correct at the end of the procedure.  The patient was then extubated in the operating room and taken in stable condition to recovery room.     Abigail Miyamoto, M.D.     DB/MEDQ  D:  11/24/2012  T:  11/25/2012  Job:  [id number]

## 2012-12-08 ENCOUNTER — Encounter (INDEPENDENT_AMBULATORY_CARE_PROVIDER_SITE_OTHER): Payer: Self-pay

## 2012-12-08 ENCOUNTER — Ambulatory Visit (INDEPENDENT_AMBULATORY_CARE_PROVIDER_SITE_OTHER): Admitting: Surgery

## 2012-12-08 ENCOUNTER — Encounter (INDEPENDENT_AMBULATORY_CARE_PROVIDER_SITE_OTHER): Payer: Self-pay | Admitting: Surgery

## 2012-12-08 VITALS — BP 120/78 | HR 76 | Resp 16 | Ht 63.0 in | Wt 198.4 lb

## 2012-12-08 DIAGNOSIS — Z09 Encounter for follow-up examination after completed treatment for conditions other than malignant neoplasm: Secondary | ICD-10-CM

## 2012-12-08 NOTE — Progress Notes (Signed)
Subjective:     Patient ID: Tammy Rodgers, female   DOB: 12/10/1960, 52 y.o.   MRN: [id number]  HPI She is here for her first postop visit status post left breast lumpectomy. She is doing well and has no complaints  Review of Systems     Objective:   Physical Exam On exam, the incision is well-healed. The pathology confirmed a benign papilloma with no evidence of malignancy.    Assessment:     Patient stable postop     Plan:     She may resume her normal activity. She will continue her yearly mammograms. I will see her back as needed

## 2012-12-10 ENCOUNTER — Ambulatory Visit (INDEPENDENT_AMBULATORY_CARE_PROVIDER_SITE_OTHER): Admitting: Internal Medicine

## 2012-12-10 ENCOUNTER — Encounter: Payer: Self-pay | Admitting: Internal Medicine

## 2012-12-10 VITALS — BP 140/80 | HR 70 | Ht 63.0 in | Wt 202.0 lb

## 2012-12-10 DIAGNOSIS — G4733 Obstructive sleep apnea (adult) (pediatric): Secondary | ICD-10-CM

## 2012-12-10 DIAGNOSIS — G4710 Hypersomnia, unspecified: Secondary | ICD-10-CM

## 2012-12-10 MED ORDER — ARMODAFINIL 150 MG PO TABS: 150.0000 mg | ORAL_TABLET | Freq: Every day | ORAL | Status: DC

## 2012-12-10 NOTE — Progress Notes (Signed)
10/28/12- 51 yoF never smoker referred courtesy of Dr Danelle Earthly sleep study at [facility] about 1.5 years ago.  Onset in last few years of loud snore, irresistible daytime sleepiness. Current CPAP/ [employer] not helping.  Home unattended sleep study-SNAP-04/16/11- WNL- AHI 0.2/ hr, weight 190 lbs. No opportunity to nap. Bedtime 11:30 PM, short sleep latency, waking once before up at 6:30 AM.. Hx complex partial seizures controlled long-term with chronic phenobarbital 30 mg and also on Lamictal x decades.  Hx parathyroid surgery. Still has tonsils.  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  Mother snores.  Gets flu vax from PCP.  12/10/12- 6 yoF never smoker followed for hypersomnia / OSA, complicated by seizure disorder FOLLOWS FOR:  Wearing CPAP 5 hours per night.  Still tired thourghout the day AutoPap does stop her snoring and she likes new mask better. She still complains of daytime sleepiness and we reviewed sleep hygiene. Reminded that her original sleep study in 2013 was non-diagnostic for OSA. Discussed trial of Nuvigil while we schedule formal NPSG with MSLT.  ROS-see HPI Constitutional:   No-   weight loss, night sweats, fevers, chills, +fatigue, lassitude. HEENT:   No-  headaches, difficulty swallowing, tooth/dental problems, sore throat,       No-  sneezing, itching, ear ache, nasal congestion, post nasal drip,  CV:  No-   chest pain, orthopnea, PND, swelling in lower extremities, anasarca, dizziness, palpitations Resp: No-   shortness of breath with exertion or at rest.              No-   productive cough,  No non-productive cough,  No- coughing up of blood.              No-   change in color of mucus.  No- wheezing.   Skin: No-   rash or lesions. GI:  No-   heartburn, indigestion, abdominal pain, nausea, vomiting,  GU:  MS:  No-   joint pain or swelling.   Neuro-     nothing unusual Psych:  No- change in mood or affect. No depression or anxiety.  No memory loss.  OBJ-  Physical Exam General- Alert, Oriented, Affect-appropriate, Distress- none acute, +overweight Skin- rash-none, lesions- none, excoriation- none Lymphadenopathy- none Head- atraumatic            Eyes- Gross vision intact, PERRLA, conjunctivae and secretions clear            Ears- Hearing, canals-normal            Nose- Clear, no-Septal dev, mucus, polyps, erosion, perforation             Throat- Mallampati III-IV , mucosa clear , drainage- none, tonsils- atrophic Neck- Full, trachea midline, no stridor , thyroid nl, carotid no bruit Chest - symmetrical excursion , unlabored           Heart/CV- RRR , no murmur , no gallop  , no rub, nl s1 s2                           - JVD- none , edema- none, stasis changes- none, varices- none           Lung- clear to P&A, wheeze- none, cough- none , dullness-none, rub- none           Chest wall-  Abd-  Br/ Gen/ Rectal- Not done, not indicated Extrem- cyanosis- none, clubbing, none, atrophy- none, strength- nl Neuro- grossly intact to observation

## 2012-12-10 NOTE — Patient Instructions (Addendum)
Order- schedule NPSG- split protocol    Dx OSA                             MSLT    Dx hypersomnia  Sample Nuvigil 150    Try one each morning as needed, for sleepiness  Do not take this on days of sleep studies

## 2012-12-13 ENCOUNTER — Encounter: Payer: Self-pay | Admitting: Internal Medicine

## 2012-12-13 NOTE — Assessment & Plan Note (Signed)
She has been using CPAP and does stop snore. Snoring and overweight certainly suggest the possibility that daytime sleepiness reflects sleep apnea. 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 Plan-schedule formal NPSG. With MSLT, meanwhile sample Nuvigil 150

## 2013-01-09 ENCOUNTER — Ambulatory Visit (HOSPITAL_BASED_OUTPATIENT_CLINIC_OR_DEPARTMENT_OTHER): Attending: Internal Medicine | Admitting: Radiology

## 2013-01-09 VITALS — Ht 63.0 in | Wt 198.0 lb

## 2013-01-09 DIAGNOSIS — G4710 Hypersomnia, unspecified: Secondary | ICD-10-CM | POA: Insufficient documentation

## 2013-01-09 DIAGNOSIS — G4733 Obstructive sleep apnea (adult) (pediatric): Secondary | ICD-10-CM

## 2013-01-10 ENCOUNTER — Ambulatory Visit (HOSPITAL_BASED_OUTPATIENT_CLINIC_OR_DEPARTMENT_OTHER): Attending: Internal Medicine | Admitting: Radiology

## 2013-01-10 DIAGNOSIS — G4710 Hypersomnia, unspecified: Secondary | ICD-10-CM

## 2013-01-10 DIAGNOSIS — G47419 Narcolepsy without cataplexy: Secondary | ICD-10-CM

## 2013-01-15 DIAGNOSIS — G4733 Obstructive sleep apnea (adult) (pediatric): Secondary | ICD-10-CM

## 2013-01-15 NOTE — Sleep Study (Signed)
    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 NPSG done 01/09/2013 recorded total sleep time 359.5 minutes with 13.9% REM, AHI 4.8 per hour. Bedtime medication for that study included Vytorin, duloxetine, Lamictal, phenobarbital. An unattended home sleep study on 04/16/2011 had recorded an AHI of 0.2 per hour. She had been using CPAP with autotitration. For the present study she wore CPAP 10 CWP for each nap study.  EPWORTH SLEEPINESS SCORE:   15/24 HEIGHT:   5 feet 3 inches WEIGHT:   198 pounds   BMI 35   NECK SIZE: 16 in.  MEDICATIONS: Charted for review. Bedtime medication the night before had included Vytorin, duloxetine, Lamictal, phenobarbital. During the present study Exforge and Lamictal were taken in the morning and Lamictal was taken again at 1600 hours  NAP 1: 8:02 AM- sleep latency 6.5 minutes, REM latency NA  NAP 2: 10:02 AM-sleep latency 5 minutes, REM latency NA  NAP 3: 12:00 Noon-sleep latency 2 minutes, REM latency NA  NAP 4: 14:00 PM sleep latency 5 minutes, REM latency NA  NAP 5: 16:00 PM sleep latency 0.5 minutes, REM latency NA  MEAN SLEEP LATENCY: 3.8 minutes  NUMBER OF REM EPISODES:  None  COMMENTS:  The patient had taken a Lamictal and phenobarbital at bedtime before her NPSG study the night before. That study demonstrated adequate sleep without elevated percentage time in rem or unusual sleep disturbance. AHI of 4.8 per hour. She took Lamictal in the morning and again at 1600 p.m. during this MSLT. The study was performed with the patient wearing CPAP 10 for each nap, because she had been wearing CPAP at home. Available documentation indicates a home sleep  study on 04/16/2011 recording an AHI of 0.2 per hour.   IMPRESSION/ RECOMMENDATION:   1) Pathologic daytime hypersomnolence is documented, mean sleep latency 3.8 minutes, without sleep onset REM.   2) These results could be consistent with either narcolepsy or idiopathic hypersomnia. The study is nonspecific without sleep onset REM and in the context of medications taken during the previous night and during the current study as noted.    signed Baird Lyons M.D.  [name] Diplomate, American Board of Sleep Medicine  ELECTRONICALLY SIGNED ON:  01/15/2013, 12:18 PM [name] PH: [phone]   FX: [phone] [name]

## 2013-01-15 NOTE — Sleep Study (Signed)
   NAME: Tammy Rodgers DATE OF BIRTH:  06-29-1960 MEDICAL RECORD NUMBER [id number]  LOCATION: [facility]  PHYSICIAN: YOUNG,CLINTON D  DATE OF STUDY: 01/09/2013  SLEEP STUDY TYPE: Nocturnal Polysomnogram               REFERRING PHYSICIAN: Baird Lyons D, MD  INDICATION FOR STUDY: Hypersomnia with sleep apnea  EPWORTH SLEEPINESS SCORE:   15/24 HEIGHT: 5\' 3"  (160 cm)  WEIGHT: 198 lb (89.812 kg)    Body mass index is 35.08 kg/(m^2).  NECK SIZE: 16 in.  MEDICATIONS: Charted for review  SLEEP ARCHITECTURE: Total sleep time 359.5 minutes with sleep efficiency 89.1%. Stage I was 3.1%, stage II 83%, stage III absent, REM 13.9% of total sleep time. Sleep latency 22 minutes, REM latency 198 minutes, awake after sleep onset 32 minutes, arousal index 5.2. Bedtime medication: Vytorin, duloxetine, Lamictal, phenobarbital  RESPIRATORY DATA: Apnea hypopnea index (AHI) 4.8 per hour. A total of 29 events were scored including 20 obstructive apneas, 1 central apnea, 1 mixed apnea, 7 hypopneas. Most events were associated with supine sleep position. REM AHI 31.2 per hour. There were not enough early events to permit application of split protocol CPAP titration.  OXYGEN DATA: Moderate snoring with oxygen desaturation to a nadir of 88% and mean oxygen saturation through the study of 93.7% on room air  CARDIAC DATA: Normal sinus rhythm  MOVEMENT/PARASOMNIA: No significant movement disturbance. No bathroom trips  IMPRESSION/ RECOMMENDATION:   1) Unremarkable sleep architecture for sleep center environment. Bedtime medication including Vytorin, duloxetine, Lamictal, phenobarbital. 2) Occasional respiratory events with sleep disturbance, within normal limits. AHI 4.8 per hour (the normal range for adults is an AHI from 0-5 events per hour).  3) Note results of MSLT recorded during the day following this study.   Signed Baird Lyons M.D. [name] Diplomate, American Board  of Sleep Medicine  ELECTRONICALLY SIGNED ON:  01/15/2013, 12:10 PM [place] PH: [phone]   FX: [phone] [place]

## 2013-01-16 DIAGNOSIS — G47419 Narcolepsy without cataplexy: Secondary | ICD-10-CM

## 2013-01-16 DIAGNOSIS — G4710 Hypersomnia, unspecified: Secondary | ICD-10-CM

## 2013-01-16 DIAGNOSIS — G4730 Sleep apnea, unspecified: Secondary | ICD-10-CM

## 2013-01-17 ENCOUNTER — Ambulatory Visit
Admission: RE | Admit: 2013-01-17 | Discharge: 2013-01-17 | Disposition: A | Discharge status: Home / Self Care | Source: Ambulatory Visit | Attending: Family Medicine | Admitting: Family Medicine

## 2013-01-17 ENCOUNTER — Other Ambulatory Visit: Payer: Self-pay | Admitting: Family Medicine

## 2013-01-17 DIAGNOSIS — M79672 Pain in left foot: Secondary | ICD-10-CM

## 2013-01-17 IMAGING — CR DG FOOT COMPLETE 3+V*L*
3 series · 3 of 3 positions shown · non-contrast
Comparison: No comparison studies available.

CLINICAL DATA: Left foot pain

EXAM:
LEFT FOOT - COMPLETE 3+ VIEW

[t foot ap left]
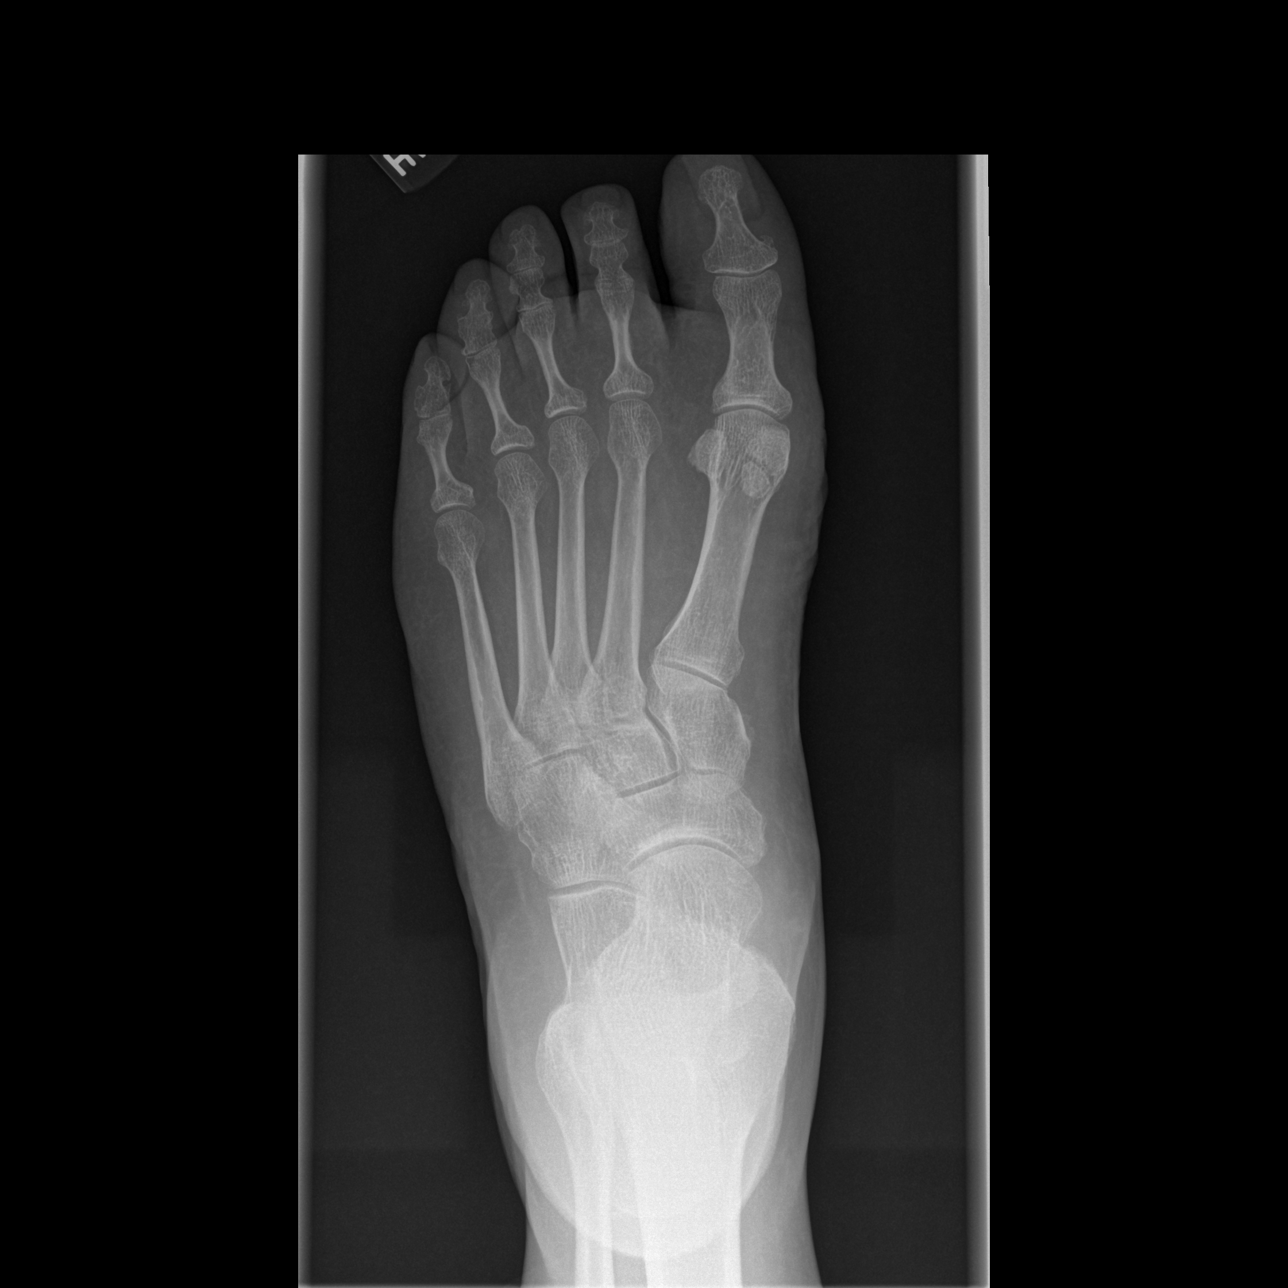

[t foot oblique left]
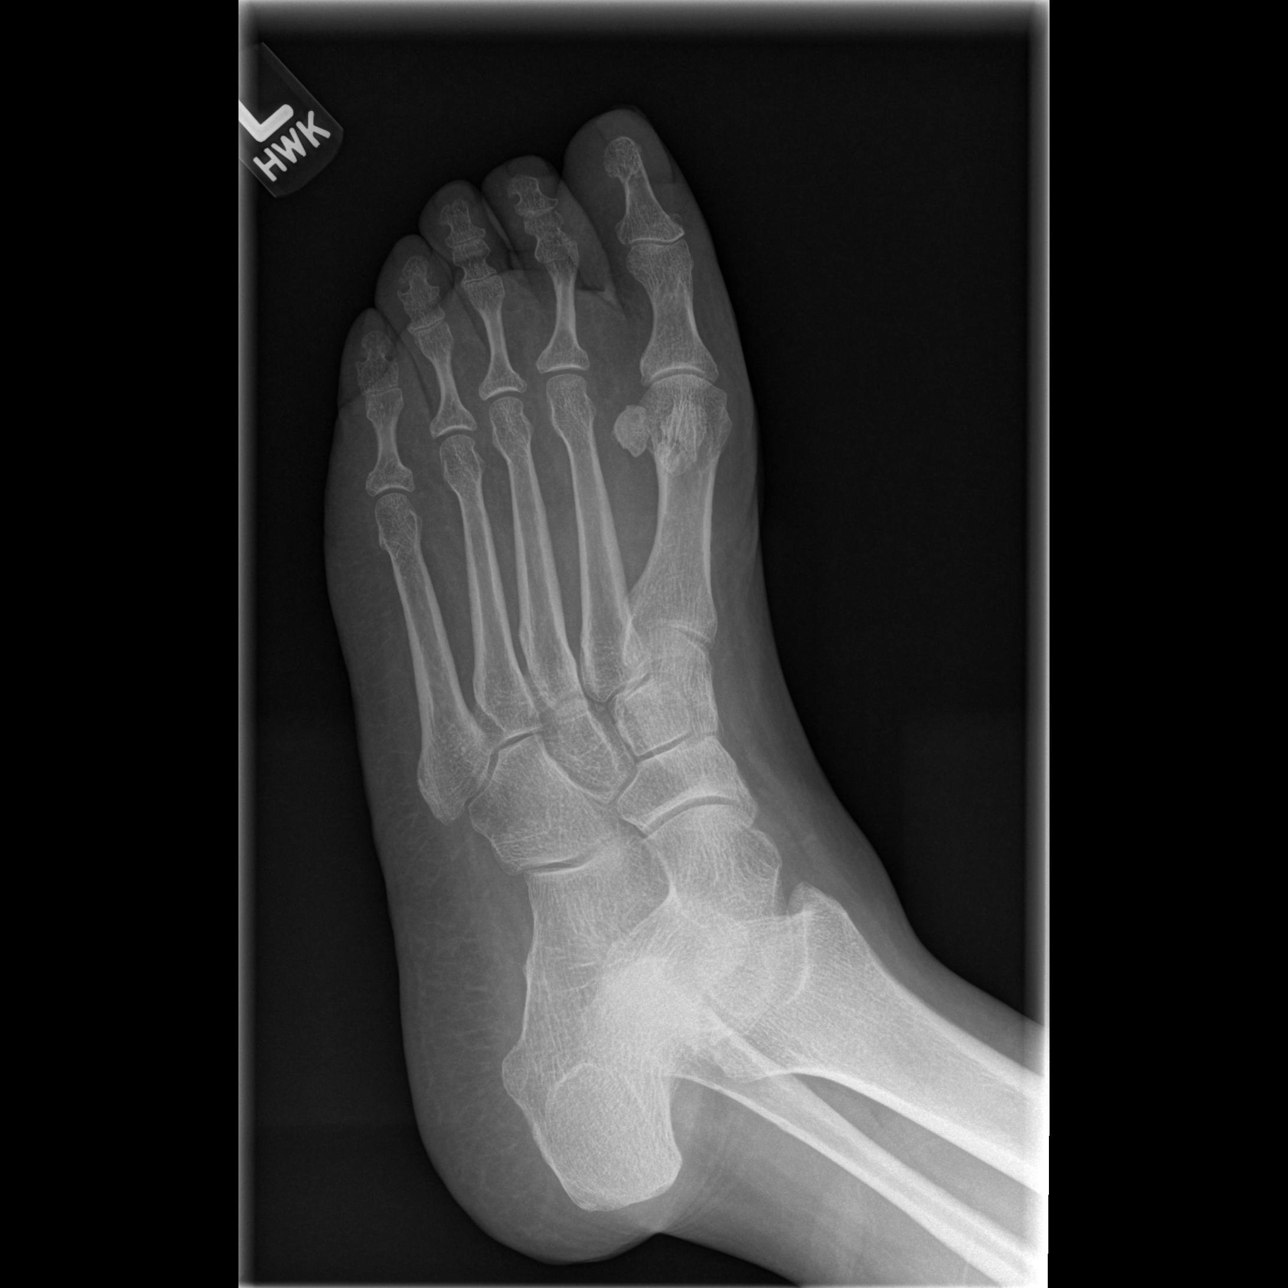

[t foot lat left]
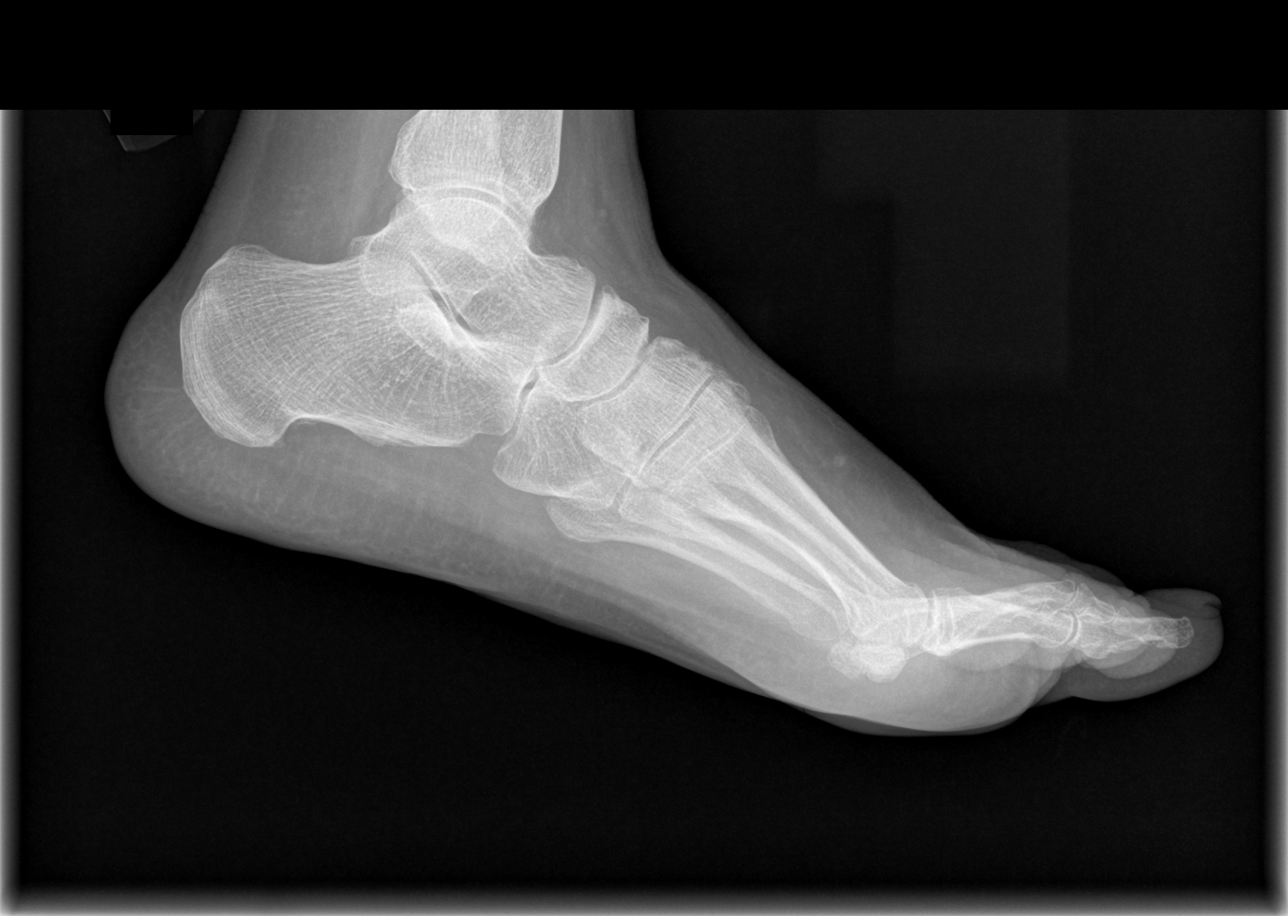

[3 of 3 positions shown; findings below may reference images not displayed]

FINDINGS: Three-view exam shows no fracture. No subluxation or dislocation. No
sclerosis or cortical thickening in the metatarsals to suggest
stress response. Overlying soft tissues are unremarkable. There is
some mild degenerative changes in the MTP joint of the great toe.
IMPRESSION: Mild degenerative changes in the MTP joint of the great toe.

## 2013-02-10 ENCOUNTER — Ambulatory Visit (INDEPENDENT_AMBULATORY_CARE_PROVIDER_SITE_OTHER): Admitting: Internal Medicine

## 2013-02-10 ENCOUNTER — Encounter: Payer: Self-pay | Admitting: Internal Medicine

## 2013-02-10 ENCOUNTER — Other Ambulatory Visit

## 2013-02-10 VITALS — BP 128/74 | HR 92 | Ht 63.0 in | Wt 206.8 lb

## 2013-02-10 DIAGNOSIS — G4733 Obstructive sleep apnea (adult) (pediatric): Secondary | ICD-10-CM

## 2013-02-10 DIAGNOSIS — G4710 Hypersomnia, unspecified: Secondary | ICD-10-CM

## 2013-02-10 MED ORDER — METHYLPHENIDATE HCL ER 10 MG PO TBCR: EXTENDED_RELEASE_TABLET | ORAL | Status: DC

## 2013-02-10 NOTE — Progress Notes (Signed)
10/28/12- 40 yoF never smoker referred courtesy of Dr Valetta Fuller sleep study at [facility] about 1.5 years ago.  Onset in last few years of loud snore, irresistible daytime sleepiness. Current CPAP/ [employer] not helping.  Home unattended sleep study-SNAP-04/16/11- WNL- AHI 0.2/ hr, weight 190 lbs. No opportunity to nap. Bedtime 11:30 PM, short sleep latency, waking once before up at 6:30 AM.. Hx complex partial seizures controlled long-term with chronic phenobarbital 30 mg and also on Lamictal x decades.  Hx parathyroid surgery. Still has tonsils.  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  Mother snores.  Gets flu vax from PCP.  12/10/12- 35 yoF never smoker followed for hypersomnia / OSA, complicated by seizure disorder FOLLOWS FOR:  Wearing CPAP 5 hours per night.  Still tired thourghout the day AutoPap does stop her snoring and she likes new mask better. She still complains of daytime sleepiness and we reviewed sleep hygiene. Reminded that her original sleep study in 2013 was non-diagnostic for OSA. Discussed trial of Nuvigil while we schedule formal NPSG with MSLT.  02/10/13- 41 yoF never smoker followed for hypersomnia / OSA, complicated by seizure disorder FOLLOWS FOR:  Discuss sleep study result done 01/12/13 & 01/18/13.  Wearing CPAP Auto/ [employer] 5-6 per night with no complaints Current CPAP stops most of her snoring. NPSG 01/09/13- AHI 4.8/ hr, weight 198 lbs, Sleep 359 minutes/ REM 13.9% MSLT 01/10/13- Mean latency severely short 3.8 minutes, indicating severe daytime sleepiness. However she had taken lamictal, phenobarb, duloxetine, so study is not specific for primary hypersomnia disorder like narcolepsy, although it doesn't rule that out. Never found Nuvigil helpful  ROS-see HPI Constitutional:   No-   weight loss, night sweats, fevers, chills, +fatigue, lassitude. HEENT:   No-  headaches, difficulty swallowing, tooth/dental problems, sore throat,       No-  sneezing, itching, ear  ache, nasal congestion, post nasal drip,  CV:  No-   chest pain, orthopnea, PND, swelling in lower extremities, anasarca, dizziness, palpitations Resp: No-   shortness of breath with exertion or at rest.              No-   productive cough,  No non-productive cough,  No- coughing up of blood.              No-   change in color of mucus.  No- wheezing.   Skin: No-   rash or lesions. GI:  No-   heartburn, indigestion, abdominal pain, nausea, vomiting,  GU:  MS:  No-   joint pain or swelling.   Neuro-     nothing unusual Psych:  No- change in mood or affect. No depression or anxiety.  No memory loss.  OBJ- Physical Exam General- Alert, Oriented, Affect-appropriate, Distress- none acute, +overweight Skin- rash-none, lesions- none, excoriation- none Lymphadenopathy- none Head- atraumatic            Eyes- Gross vision intact, PERRLA, conjunctivae and secretions clear            Ears- Hearing, canals-normal            Nose- Clear, no-Septal dev, mucus, polyps, erosion, perforation             Throat- Mallampati III-IV , mucosa clear , drainage- none, tonsils- atrophic Neck- Full, trachea midline, no stridor , thyroid nl, carotid no bruit Chest - symmetrical excursion , unlabored           Heart/CV- RRR , no murmur , no gallop  , no rub, nl  s1 s2                           - JVD- none , edema- none, stasis changes- none, varices- none           Lung- clear to P&A, wheeze- none, cough- none , dullness-none, rub- none           Chest wall-  Abd-  Br/ Gen/ Rectal- Not done, not indicated Extrem- cyanosis- none, clubbing, none, atrophy- none, strength- nl Neuro- grossly intact to observation

## 2013-02-10 NOTE — Patient Instructions (Addendum)
Order- lab Narcolepsy panel  Script - methylphenidate/ generic Ritalin- try as directed   Aim for 7-8 hours of sleep on a regular schedule every night. Plan on trying to get a short nap every day if possible.   Order- DME- [employer] increase CPAP pressure by 1 cwp and tell us what that pressure is please.

## 2013-03-06 ENCOUNTER — Encounter: Payer: Self-pay | Admitting: Internal Medicine

## 2013-03-06 DIAGNOSIS — G4710 Hypersomnia, unspecified: Secondary | ICD-10-CM | POA: Insufficient documentation

## 2013-03-06 NOTE — Assessment & Plan Note (Signed)
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 However she owns her own machine and is comfortable using it. She snores loudly so she will continue CPAP and [employer] can adjust the pressure to control snoring.

## 2013-03-11 ENCOUNTER — Encounter: Payer: Self-pay | Admitting: *Deleted

## 2013-03-11 ENCOUNTER — Telehealth: Payer: Self-pay | Admitting: Internal Medicine

## 2013-03-11 ENCOUNTER — Other Ambulatory Visit: Payer: Self-pay | Admitting: Internal Medicine

## 2013-03-11 NOTE — Telephone Encounter (Signed)
Ok to refill 

## 2013-03-11 NOTE — Telephone Encounter (Signed)
Last OV 02/10/13 Pending OV 04/11/13 Last fill 02/10/13 #30  CY - please advise on refill. Thanks.

## 2013-03-11 NOTE — Telephone Encounter (Signed)
?   Does she want [place] to mail this or p/u  LMTCB and will then print rx

## 2013-03-14 MED ORDER — METHYLPHENIDATE HCL ER 10 MG PO TBCR: EXTENDED_RELEASE_TABLET | ORAL | Status: DC

## 2013-03-14 NOTE — Telephone Encounter (Signed)
rx printed and placed on CY cart to sign. Pt wants rx mailed. Address verified. Nile Bing, CMA

## 2013-03-14 NOTE — Telephone Encounter (Signed)
rx signed by CDY.  Called, left detailed message on pt's named voicemail informing her that the rx has been signed and placed in the mail as requested.  Encouraged pt to call with any further questions/concerns or if she does not receive her rx.  Nothing further needed; will sign off.

## 2013-03-17 ENCOUNTER — Ambulatory Visit: Admitting: Internal Medicine

## 2013-04-11 ENCOUNTER — Encounter: Payer: Self-pay | Admitting: Internal Medicine

## 2013-04-11 ENCOUNTER — Ambulatory Visit (INDEPENDENT_AMBULATORY_CARE_PROVIDER_SITE_OTHER): Admitting: Internal Medicine

## 2013-04-11 VITALS — BP 110/70 | HR 76 | Ht 63.0 in | Wt 203.6 lb

## 2013-04-11 DIAGNOSIS — G4733 Obstructive sleep apnea (adult) (pediatric): Secondary | ICD-10-CM

## 2013-04-11 DIAGNOSIS — G4710 Hypersomnia, unspecified: Secondary | ICD-10-CM

## 2013-04-11 MED ORDER — METHYLPHENIDATE HCL ER 10 MG PO TBCR: EXTENDED_RELEASE_TABLET | ORAL | Status: DC

## 2013-04-11 NOTE — Patient Instructions (Addendum)
Ok to take the Ritalin 10 mg ER once in the morning, and again in early afternoon, if needed (when patient gets refill next time change QTY to #60)  OK to continue using the CPAP

## 2013-04-11 NOTE — Progress Notes (Signed)
10/28/12- 47 yoF never smoker referred courtesy of Dr Valetta Fuller sleep study at [facility] about 1.5 years ago.  Onset in last few years of loud snore, irresistible daytime sleepiness. Current CPAP/ [employer] not helping.  Home unattended sleep study-SNAP-04/16/11- WNL- AHI 0.2/ hr, weight 190 lbs. No opportunity to nap. Bedtime 11:30 PM, short sleep latency, waking once before up at 6:30 AM.. Hx complex partial seizures controlled long-term with chronic phenobarbital 30 mg and also on Lamictal x decades.  Hx parathyroid surgery. Still has tonsils.  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  Mother snores.  Gets flu vax from PCP.  12/10/12- 60 yoF never smoker followed for hypersomnia / OSA, complicated by seizure disorder FOLLOWS FOR:  Wearing CPAP 5 hours per night.  Still tired thourghout the day AutoPap does stop her snoring and she likes new mask better. She still complains of daytime sleepiness and we reviewed sleep hygiene. Reminded that her original sleep study in 2013 was non-diagnostic for OSA. Discussed trial of Nuvigil while we schedule formal NPSG with MSLT.  02/10/13- 76 yoF never smoker followed for hypersomnia / OSA, complicated by seizure disorder FOLLOWS FOR:  Discuss sleep study result done 01/12/13 & 01/18/13.  Wearing CPAP Auto/ [employer] 5-6 per night with no complaints Current CPAP stops most of her snoring. NPSG 01/09/13- AHI 4.8/ hr, weight 198 lbs, Sleep 359 minutes/ REM 13.9% MSLT 01/10/13- Mean latency severely short 3.8 minutes, indicating severe daytime sleepiness. However she had taken lamictal, phenobarb, duloxetine, so study is not specific for primary hypersomnia disorder like narcolepsy, although it doesn't rule that out. Never found Nuvigil helpful  04/11/13- 52 yoF never smoker followed for hypersomnia / ? OSA,   complicated by seizure disorder FOLLOWS FOR: wearing CPAP Auto/ [employer] every night for about 7 hours.  Stable daytime sleepiness especially in the  afternoon. Ritalin keeps her alert enough to work-1 is enough. Narcolepsy HLA DQB1 gene assay was negative.  ROS-see HPI Constitutional:   No-   weight loss, night sweats, fevers, chills, +fatigue, lassitude. HEENT:   No-  headaches, difficulty swallowing, tooth/dental problems, sore throat,       No-  sneezing, itching, ear ache, nasal congestion, post nasal drip,  CV:  No-   chest pain, orthopnea, PND, swelling in lower extremities, anasarca, dizziness, palpitations Resp: No-   shortness of breath with exertion or at rest.              No-   productive cough,  No non-productive cough,  No- coughing up of blood.              No-   change in color of mucus.  No- wheezing.   Skin: No-   rash or lesions. GI:  No-   heartburn, indigestion, abdominal pain, nausea, vomiting,  GU:  MS:  No-   joint pain or swelling.   Neuro-     nothing unusual Psych:  No- change in mood or affect. No depression or anxiety.  No memory loss.  OBJ- Physical Exam General- Alert, Oriented, Affect-appropriate, Distress- none acute, +overweight Skin- rash-none, lesions- none, excoriation- none Lymphadenopathy- none Head- atraumatic            Eyes- Gross vision intact, PERRLA, conjunctivae and secretions clear            Ears- Hearing, canals-normal            Nose- Clear, no-Septal dev, mucus, polyps, erosion, perforation  Throat- Mallampati III-IV , mucosa clear , drainage- none, tonsils- atrophic Neck- Full, trachea midline, no stridor , thyroid nl, carotid no bruit Chest - symmetrical excursion , unlabored           Heart/CV- RRR , no murmur , no gallop  , no rub, nl s1 s2                           - JVD- none , edema- none, stasis changes- none, varices- none           Lung- clear to P&A, wheeze- none, cough- none , dullness-none, rub- none           Chest wall-  Abd-  Br/ Gen/ Rectal- Not done, not indicated Extrem- cyanosis- none, clubbing, none, atrophy- none, strength- nl Neuro- grossly  intact to observation

## 2013-05-07 ENCOUNTER — Encounter: Payer: Self-pay | Admitting: Internal Medicine

## 2013-05-07 NOTE — Assessment & Plan Note (Signed)
Plan-continue CPAP auto PAP/[employer]

## 2013-05-07 NOTE — Assessment & Plan Note (Signed)
CPAP compliance and control seems good. Residual daytime sleepiness may be a primary hypersomnia but is not likely narcolepsy. Plan-emphasis on good sleep hygiene and adequate sleep. Discussed Ritalin.

## 2013-06-01 ENCOUNTER — Telehealth: Payer: Self-pay | Admitting: Internal Medicine

## 2013-06-01 NOTE — Telephone Encounter (Signed)
Adderall may be aggravating her seizures. She should stop Adderall. She should discuss this situation with her neurologist. Nuvigil/ Provigil are alternatives not listed as having seizures as a side effect, but they cost more, which is why we wanted to try the cheaper alternative first.

## 2013-06-01 NOTE — Telephone Encounter (Signed)
I spoke w/ pt. She reports we RX her adderall. She has also had an increase in seizures. Wants to know if the adderall could be causing that? Please advise CDY thanks

## 2013-06-01 NOTE — Telephone Encounter (Signed)
Spoke with the pt and notified of recs per CDY  She verbalized understanding  Nothing further needed 

## 2013-08-01 ENCOUNTER — Encounter (HOSPITAL_COMMUNITY): Payer: Self-pay | Admitting: Emergency Medicine

## 2013-08-01 ENCOUNTER — Emergency Department (HOSPITAL_COMMUNITY)

## 2013-08-01 ENCOUNTER — Emergency Department (HOSPITAL_COMMUNITY)
Admission: EM | Admit: 2013-08-01 | Discharge: 2013-08-01 | Disposition: A | Discharge status: Home / Self Care | Attending: Emergency Medicine | Admitting: Emergency Medicine

## 2013-08-01 DIAGNOSIS — T423X4A Poisoning by barbiturates, undetermined, initial encounter: Secondary | ICD-10-CM | POA: Insufficient documentation

## 2013-08-01 DIAGNOSIS — I10 Essential (primary) hypertension: Secondary | ICD-10-CM | POA: Insufficient documentation

## 2013-08-01 DIAGNOSIS — Z9981 Dependence on supplemental oxygen: Secondary | ICD-10-CM | POA: Insufficient documentation

## 2013-08-01 DIAGNOSIS — Z8742 Personal history of other diseases of the female genital tract: Secondary | ICD-10-CM | POA: Insufficient documentation

## 2013-08-01 DIAGNOSIS — Z79899 Other long term (current) drug therapy: Secondary | ICD-10-CM | POA: Insufficient documentation

## 2013-08-01 DIAGNOSIS — G4730 Sleep apnea, unspecified: Secondary | ICD-10-CM | POA: Insufficient documentation

## 2013-08-01 DIAGNOSIS — Y9289 Other specified places as the place of occurrence of the external cause: Secondary | ICD-10-CM | POA: Insufficient documentation

## 2013-08-01 DIAGNOSIS — Y9389 Activity, other specified: Secondary | ICD-10-CM | POA: Insufficient documentation

## 2013-08-01 DIAGNOSIS — Z7982 Long term (current) use of aspirin: Secondary | ICD-10-CM | POA: Insufficient documentation

## 2013-08-01 DIAGNOSIS — G40909 Epilepsy, unspecified, not intractable, without status epilepticus: Secondary | ICD-10-CM | POA: Insufficient documentation

## 2013-08-01 DIAGNOSIS — E7800 Pure hypercholesterolemia, unspecified: Secondary | ICD-10-CM | POA: Insufficient documentation

## 2013-08-01 DIAGNOSIS — T423X1A Poisoning by barbiturates, accidental (unintentional), initial encounter: Secondary | ICD-10-CM | POA: Insufficient documentation

## 2013-08-01 LAB — COMPREHENSIVE METABOLIC PANEL
ALK PHOS: 125 U/L — AB (ref 39–117)
ALT: 29 U/L (ref 0–35)
ANION GAP: 15 (ref 5–15)
AST: 25 U/L (ref 0–37)
Albumin: 4.5 g/dL (ref 3.5–5.2)
BUN: 16 mg/dL (ref 6–23)
CHLORIDE: 99 meq/L (ref 96–112)
CO2: 29 meq/L (ref 19–32)
Calcium: 9.6 mg/dL (ref 8.4–10.5)
Creatinine, Ser: 0.69 mg/dL (ref 0.50–1.10)
GFR calc non Af Amer: >90 mL/min (ref >90)
GLUCOSE: 115 mg/dL — AB (ref 70–99)
POTASSIUM: 3.4 meq/L — AB (ref 3.7–5.3)
SODIUM: 143 meq/L (ref 137–147)
Total Protein: 8.2 g/dL (ref 6.0–8.3)

## 2013-08-01 LAB — CBC
HCT: 38.4 % (ref 36.0–46.0)
Hemoglobin: 12.7 g/dL (ref 12.0–15.0)
MCH: 31.4 pg (ref 26.0–34.0)
MCHC: 33.1 g/dL (ref 30.0–36.0)
MCV: 94.8 fL (ref 78.0–100.0)
PLATELETS: 249 10*3/uL (ref 150–400)
RBC: 4.05 MIL/uL (ref 3.87–5.11)
RDW: 13.8 % (ref 11.5–15.5)
WBC: 4.9 10*3/uL (ref 4.0–10.5)

## 2013-08-01 LAB — RAPID URINE DRUG SCREEN, HOSP PERFORMED
AMPHETAMINES: NOT DETECTED
Barbiturates: POSITIVE — AB
Benzodiazepines: NOT DETECTED
Cocaine: NOT DETECTED
Opiates: NOT DETECTED
Tetrahydrocannabinol: NOT DETECTED

## 2013-08-01 LAB — DIFFERENTIAL
Basophils Absolute: 0 10*3/uL (ref 0.0–0.1)
Basophils Relative: 1 % (ref 0–1)
Eosinophils Absolute: 0.2 10*3/uL (ref 0.0–0.7)
Eosinophils Relative: 4 % (ref 0–5)
LYMPHS ABS: 2.8 10*3/uL (ref 0.7–4.0)
LYMPHS PCT: 57 % — AB (ref 12–46)
MONOS PCT: 11 % (ref 3–12)
Monocytes Absolute: 0.5 10*3/uL (ref 0.1–1.0)
NEUTROS ABS: 1.4 10*3/uL — AB (ref 1.7–7.7)
NEUTROS PCT: 29 % — AB (ref 43–77)

## 2013-08-01 LAB — URINALYSIS, ROUTINE W REFLEX MICROSCOPIC
BILIRUBIN URINE: NEGATIVE
GLUCOSE, UA: NEGATIVE mg/dL
Hgb urine dipstick: NEGATIVE
Ketones, ur: NEGATIVE mg/dL
Leukocytes, UA: NEGATIVE
Nitrite: NEGATIVE
PH: 6 (ref 5.0–8.0)
PROTEIN: NEGATIVE mg/dL
Specific Gravity, Urine: 1.013 (ref 1.005–1.030)
Urobilinogen, UA: 0.2 mg/dL (ref 0.0–1.0)

## 2013-08-01 LAB — I-STAT TROPONIN, ED: Troponin i, poc: 0 ng/mL (ref 0.00–0.08)

## 2013-08-01 LAB — I-STAT CHEM 8, ED
BUN: 17 mg/dL (ref 6–23)
Calcium, Ion: 1.16 mmol/L (ref 1.12–1.23)
Chloride: 101 mEq/L (ref 96–112)
Creatinine, Ser: 0.8 mg/dL (ref 0.50–1.10)
Glucose, Bld: 122 mg/dL — ABNORMAL HIGH (ref 70–99)
HCT: 41 % (ref 36.0–46.0)
Hemoglobin: 13.9 g/dL (ref 12.0–15.0)
Potassium: 3.1 mEq/L — ABNORMAL LOW (ref 3.7–5.3)
SODIUM: 141 meq/L (ref 137–147)
TCO2: 28 mmol/L (ref 0–100)

## 2013-08-01 LAB — ETHANOL

## 2013-08-01 LAB — PROTIME-INR
INR: 0.93 (ref 0.00–1.49)
PROTHROMBIN TIME: 12.5 s (ref 11.6–15.2)

## 2013-08-01 LAB — APTT: aPTT: 31 seconds (ref 24–37)

## 2013-08-01 LAB — CBG MONITORING, ED: GLUCOSE-CAPILLARY: 121 mg/dL — AB (ref 70–99)

## 2013-08-01 LAB — PHENOBARBITAL LEVEL: PHENOBARBITAL: 47 ug/mL — AB (ref 15.0–40.0)

## 2013-08-01 IMAGING — CT CT HEAD W/O CM
1 of 2 series · 15 of 30 positions shown, 19 images · non-contrast
Comparison: CT of the head performed [DATE]

CLINICAL DATA: Dizziness and facial droop. Code stroke. Status post
fall out of bed.

EXAM:
CT HEAD WITHOUT CONTRAST
TECHNIQUE: Contiguous axial images were obtained from the base of the skull
through the vertex without intravenous contrast.

[Series 3: head 2.0 h70h · axial · 0.43mm/px · z∈[-164,-22]mm · 15 of 79 slices shown, 19 images]
[im 4/79  brain]
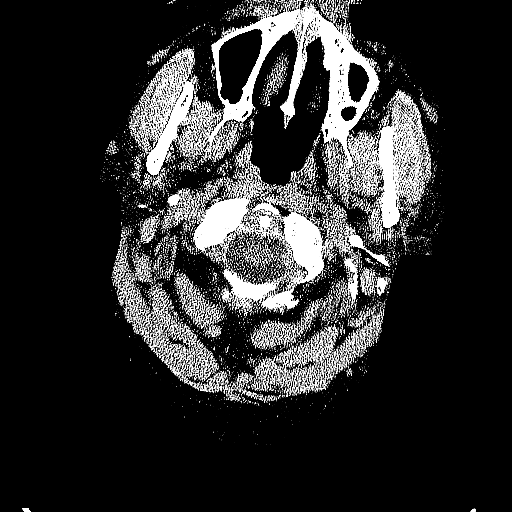
[im 4/79  bone]
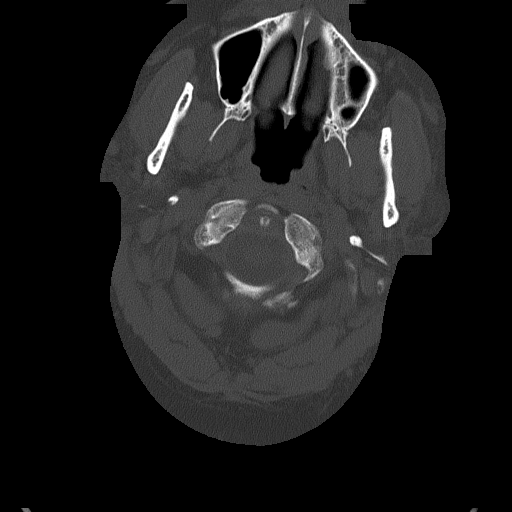
[im 8/79  brain]
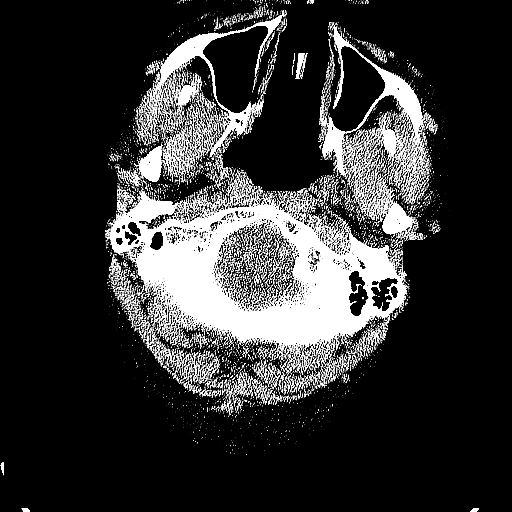
[im 16/79  brain]
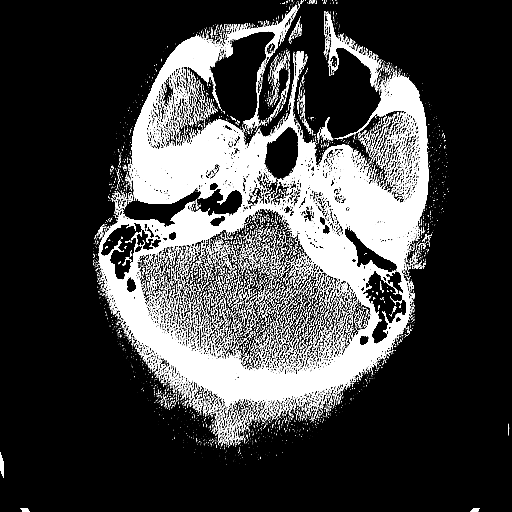
[im 20/79  brain]
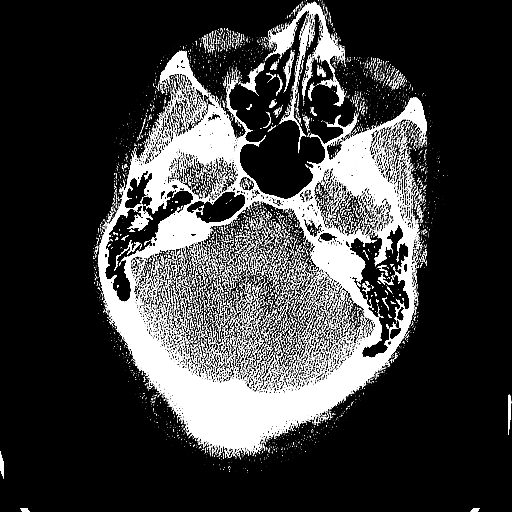
[im 24/79  brain]
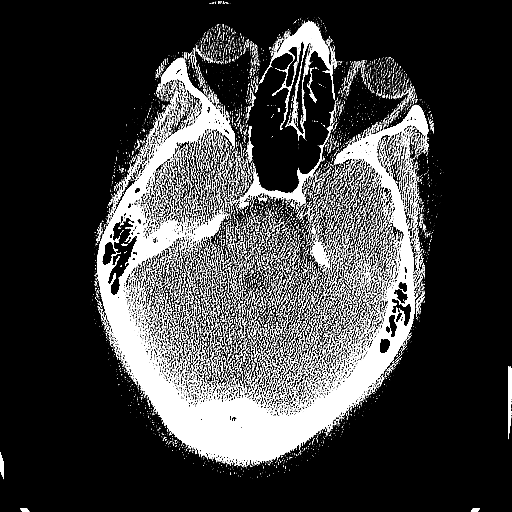
[im 24/79  bone]
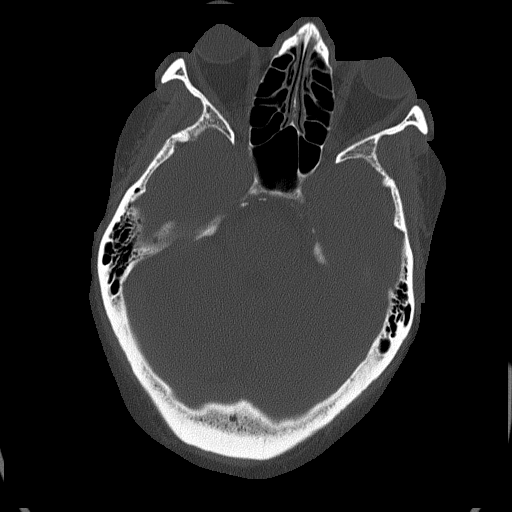
[im 28/79  brain]
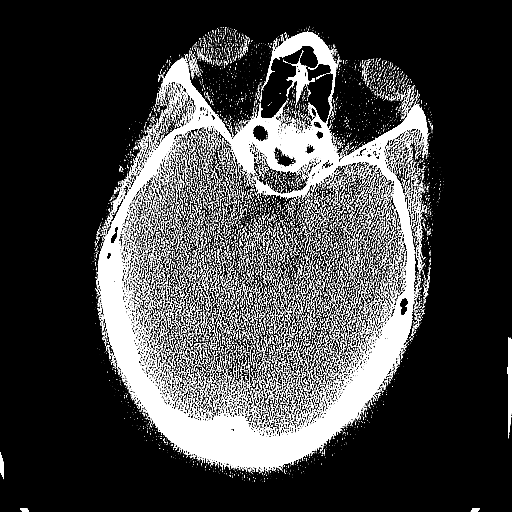
[im 36/79  brain]
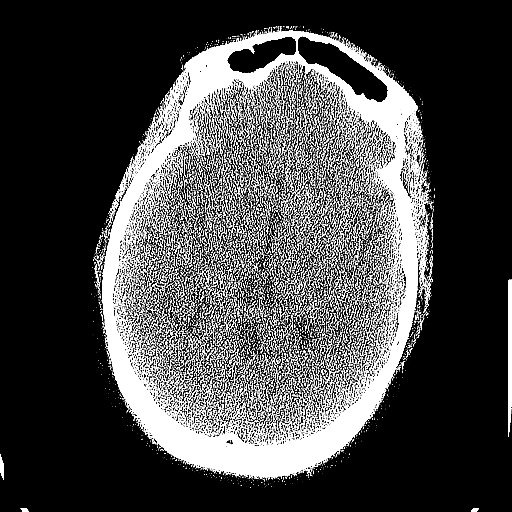
[im 40/79  brain]
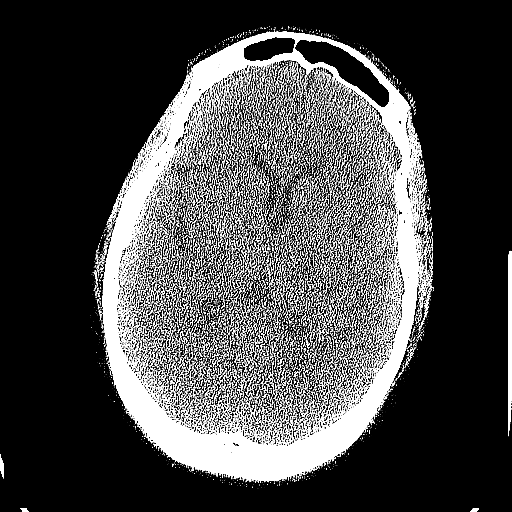
[im 43/79  brain]
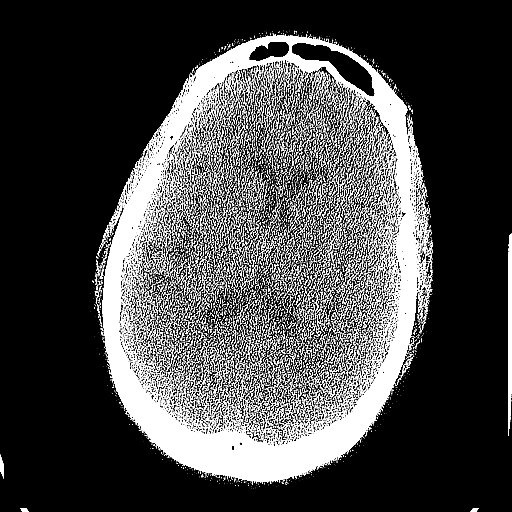
[im 43/79  bone]
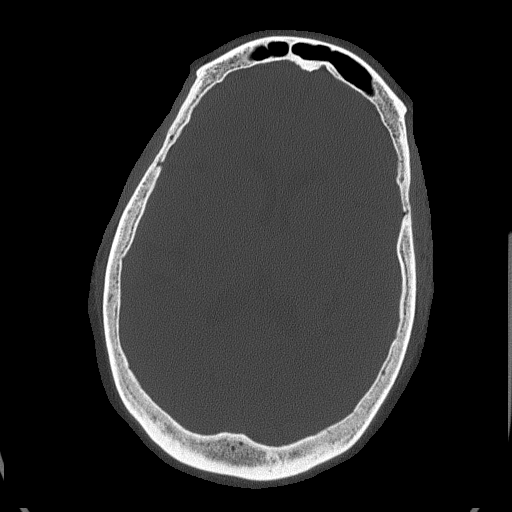
[im 51/79  brain]
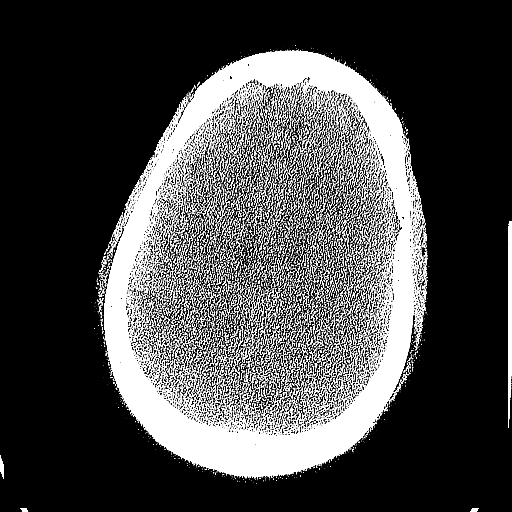
[im 55/79  brain]
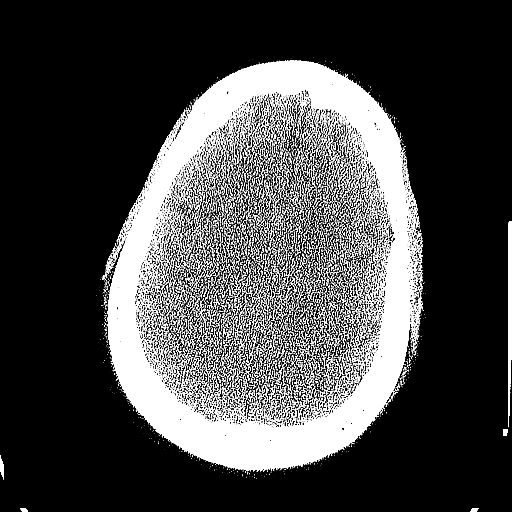
[im 59/79  brain]
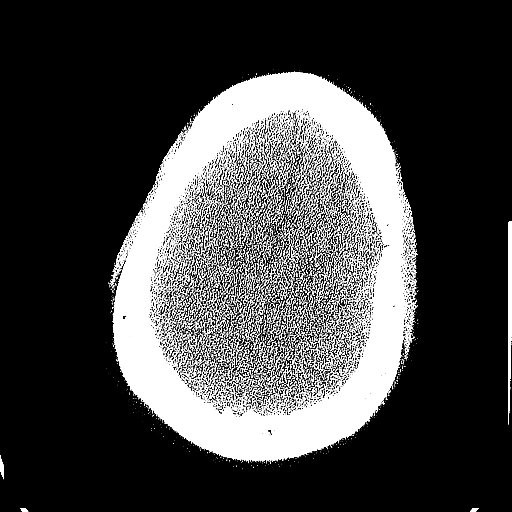
[im 63/79  brain]
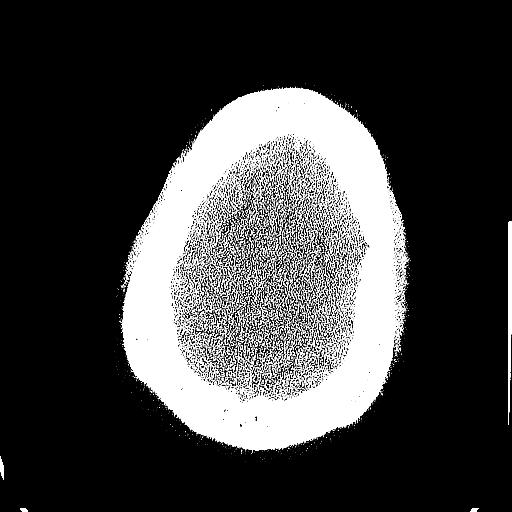
[im 63/79  bone]
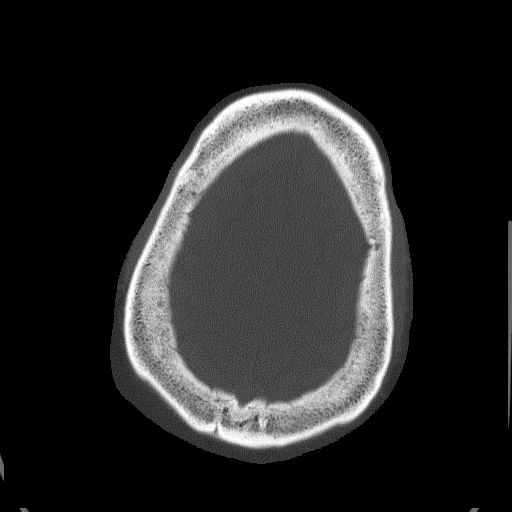
[im 71/79  brain]
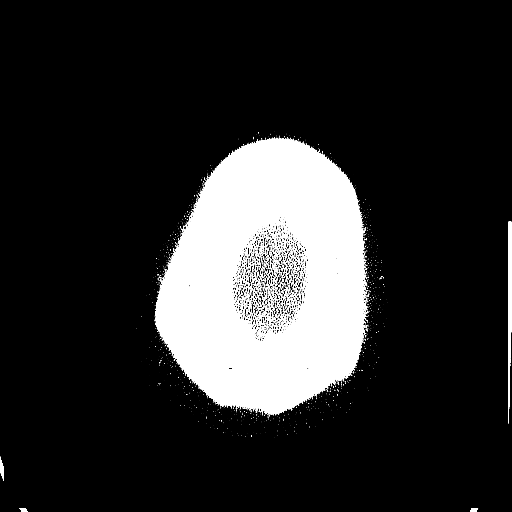
[im 75/79  brain]
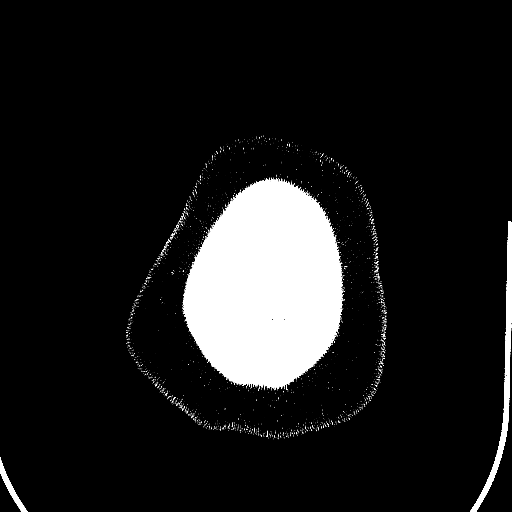

[15 of 30 positions shown; findings below may reference images not displayed]

FINDINGS: There is no evidence of acute infarction, mass lesion, or intra- or
extra-axial hemorrhage on CT.

Mild periventricular white matter change likely reflects small
vessel ischemic microangiopathy.

The posterior fossa, including the cerebellum, brainstem and fourth
ventricle, is within normal limits. The third and lateral
ventricles, and basal ganglia are unremarkable in appearance. The
cerebral hemispheres are symmetric in appearance, with normal
gray-white differentiation. No mass effect or midline shift is seen.

There is no evidence of fracture; visualized osseous structures are
unremarkable in appearance. Mild bilateral proptosis is noted. The
paranasal sinuses and mastoid air cells are well-aerated. No
significant soft tissue abnormalities are seen.
IMPRESSION: 1. No acute intracranial pathology seen on CT.
2. Mild small vessel ischemic microangiopathy.
3. Mild bilateral proptosis noted.

These results were called by telephone at the time of interpretation
on [DATE] at [DATE] to Dr. ERXLEBEN, who verbally acknowledged
these results.

## 2013-08-01 NOTE — ED Notes (Signed)
CT 2 ready

## 2013-08-01 NOTE — ED Notes (Signed)
Patient requesting to go home.  Dr. Sharol Given aware.

## 2013-08-01 NOTE — Discharge Instructions (Signed)
Please be careful when taking your medications!   Accidental Overdose A drug overdose occurs when a chemical substance (drug or medication) is used in amounts large enough to overcome a person. This may result in severe illness or death. This is a type of poisoning. Accidental overdoses of medications or other substances come from a variety of reasons. When this happens accidentally, it is often because the person taking the substance does not know enough about what they have taken. Drugs which commonly cause overdose deaths are alcohol, psychotropic medications (medications which affect the mind), pain medications, illegal drugs (street drugs) such as cocaine and heroin, and multiple drugs taken at the same time. It may result from careless behavior (such as over-indulging at a party). Other causes of overdose may include multiple drug use, a lapse in memory, or drug use after a period of no drug use.  Sometimes overdosing occurs because a person cannot remember if they have taken their medication.  A common unintentional overdose in young children involves multi-vitamins containing iron. Iron is a part of the hemoglobin molecule in blood. It is used to transport oxygen to living cells. When taken in small amounts, iron allows the body to restock hemoglobin. In large amounts, it causes problems in the body. If this overdose is not treated, it can lead to death. Never take medicines that show signs of tampering or do not seem quite right. Never take medicines in the dark or in poor lighting. Read the label and check each dose of medicine before you take it. When adults are poisoned, it happens most often through carelessness or lack of information. Taking medicines in the dark or taking medicine prescribed for someone else to treat the same type of problem is a dangerous practice. SYMPTOMS  Symptoms of overdose depend on the medication and amount taken. They can vary from over-activity with stimulant  over-dosage, to sleepiness from depressants such as alcohol, narcotics and tranquilizers. Confusion, dizziness, nausea and vomiting may be present. If problems are severe enough coma and death may result. DIAGNOSIS  Diagnosis and management are generally straightforward if the drug is known. Otherwise it is more difficult. At times, certain symptoms and signs exhibited by the patient, or blood tests, can reveal the drug in question.  TREATMENT  In an emergency department, most patients can be treated with supportive measures. Antidotes may be available if there has been an overdose of opioids or benzodiazepines. A rapid improvement will often occur if this is the cause of overdose. At home or away from medical care:  There may be no immediate problems or warning signs in children.  Not everything works well in all cases of poisoning.  Take immediate action. Poisons may act quickly.  If you think someone has swallowed medicine or a household product, and the person is unconscious, having seizures (convulsions), or is not breathing, immediately call for an ambulance. IF a person is conscious and appears to be doing OK but has swallowed a poison:  Do not wait to see what effect the poison will have. Immediately call a poison control center (listed in the white pages of your telephone book under "Poison Control" or inside the front cover with other emergency numbers). Some poison control centers have TTY capability for the deaf. Check with your local center if you or someone in your family requires this service.  Keep the container so you can read the label on the product for ingredients.  Describe what, when, and how much was taken  and the age and condition of the person poisoned. Inform them if the person is vomiting, choking, drowsy, shows a change in color or temperature of skin, is conscious or unconscious, or is convulsing.  Do not cause vomiting unless instructed by medical personnel. Do not  induce vomiting or force liquids into a person who is convulsing, unconscious, or very drowsy. Stay calm and in control.   Activated charcoal also is sometimes used in certain types of poisoning and you may wish to add a supply to your emergency medicines. It is available without a prescription. Call a poison control center before using this medication. PREVENTION  Thousands of children die every year from unintentional poisoning. This may be from household chemicals, poisoning from carbon monoxide in a car, taking their parent's medications, or simply taking a few iron pills or vitamins with iron. Poisoning comes from unexpected sources.  Store medicines out of the sight and reach of children, preferably in a locked cabinet. Do not keep medications in a food cabinet. Always store your medicines in a secure place. Get rid of expired medications.  If you have children living with you or have them as occasional guests, you should have child-resistant caps on your medicine containers. Keep everything out of reach. Child proof your home.  If you are called to the telephone or to answer the door while you are taking a medicine, take the container with you or put the medicine out of the reach of small children.  Do not take your medication in front of children. Do not tell your child how good a medication is and how good it is for them. They may get the idea it is more of a treat.  If you are an adult and have accidentally taken an overdose, you need to consider how this happened and what can be done to prevent it from happening again. If this was from a street drug or alcohol, determine if there is a problem that needs addressing. If you are not sure a problems exists, it is easy to talk to a professional and ask them if they think you have a problem. It is better to handle this problem in this way before it happens again and has a much worse consequence. Document Released: 03/15/2004 Document Revised:  03/24/2011 Document Reviewed: 08/21/2008 [facility] Patient Information 2015 [place], [place]. This information is not intended to replace advice given to you by your health care provider. Make sure you discuss any questions you have with your health care provider.

## 2013-08-01 NOTE — ED Notes (Signed)
Discharge instructions given  Voiced understanding.   

## 2013-08-01 NOTE — ED Notes (Addendum)
Pt in Trauma C, Dr. Sharol Given at [facility] assessing pt, lab at [facility], preparing to go to CT 2, pt alert, NAD, calm, interactive, speech clear, no dyspnea, airway intact.

## 2013-08-01 NOTE — ED Notes (Addendum)
Pt to ED via POV.  Family reports LKW 10pm.  C/o dizziness, "not walking straight", and facial droop.  Code Stroke activated and pt straight back.  Pt has a history of seizures.  Family also reports pt fell out of bed tonight and broke the nightstand.  Pt states she is fine and that she is just sleepy.

## 2013-08-01 NOTE — ED Notes (Signed)
Dr. Sharol Given discontinued the code stroke

## 2013-08-01 NOTE — Progress Notes (Addendum)
Code stroke  Called on 53 yo female. Hx of seizures. LSN at 10pm before pt and family went to bed. Per patient she took 2 extra phenobarbital pills before bed. Pt remembers waking up and falling on bedside table. Upon my arrival to CT scan pt lethargic but responsive. Pt states "Im fine just sleepy" Denies pain, follows commands, MAEW.  Dr. Doy Mince contacted per Dr.Otter. CBG 121. NIHSS 2, pt lethargic and has right sided facial droop. Per patient and family at bedside pt has always had "crooked smile to the right." Stroke canceled per Dr. Sharol Given at [phone]. Pt left resting in bed with ED Rn at bedside.

## 2013-08-01 NOTE — ED Provider Notes (Signed)
CSN: [id number]     Arrival date & time 08/01/13  0102 History   First MD Initiated Contact with Patient 08/01/13 0105     Chief Complaint  Patient presents with  . Code Stroke    An emergency department physician performed an initial assessment on this suspected stroke patient at [place]. (Consider location/radiation/quality/duration/timing/severity/associated sxs/prior Treatment) HPI 53 year old female presents to emergency department as a possible code stroke.  Patient presents from home accompanied by her daughter with complaint of falling out of bed tonight, dizziness after falling out of bed and difficulties walking.  Patient noted to have facial droop, she reports she has O. is had facial droop and family agrees.  Patient reports that she went to bed around 10:30.  She takes 3 tablets of phenobarbital for seizure disorder before going to sleep.  She reports that she accidentally took 5 tablets.  She denies trying to overdose or harm herself.  Patient denies any focal weakness or numbness.  She reports that she is having some difficulties with balance.  No fevers chills nausea vomiting or diarrhea. Past Medical History  Diagnosis Date  . High cholesterol   . Complex partial seizure disorder     last seizure 09/2012  . Hypertension     states BP is up and down; has been on med. x 2 mos.  . 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 Sleep apnea     uses CPAP nightly  . Seizures    Past Surgical History  Procedure Laterality Date  . Cholecystectomy    . Parathyroidectomy      partial  . Abdominal hysterectomy  12/13/2002    partial  . Breast lumpectomy with needle localization Left 11/24/2012    Procedure: LEFT BREAST LUMPECTOMY WITH NEEDLE LOCALIZATION;  Surgeon: Harl Bowie, MD;  Location: [facility];  Service: General;  Laterality: Left;   Family History  Problem Relation Age of Onset  . Heart disease     History  Substance Use Topics  . Smoking status: Never  Smoker   . Smokeless tobacco: Never Used  . Alcohol Use: No   OB History   Grav Para Term Preterm Abortions TAB SAB Ect Mult Living                 Review of Systems   See History of Present Illness; otherwise all other systems are reviewed and negative  Allergies  Iodinated diagnostic agents  Home Medications   Prior to Admission medications   Medication Sig Start Date End Date Taking? Authorizing Provider  amLODipine-valsartan (EXFORGE) 5-160 MG per tablet Take 1 tablet by mouth daily. 5-160-12.5 mg tabs   Yes Historical Provider, MD  aspirin 81 MG tablet Take 81 mg by mouth daily.   Yes Historical Provider, MD  DULoxetine (CYMBALTA) 60 MG capsule Take 60 mg by mouth daily.   Yes Historical Provider, MD  ezetimibe-simvastatin (VYTORIN) 10-20 MG per tablet Take 1 tablet by mouth at bedtime.   Yes Historical Provider, MD  lamoTRIgine (LAMICTAL) 200 MG tablet Take 200-400 mg by mouth See admin instructions. Take 1 tablet in the morning, 1 tablet in the afternoon and 2 tablets at bedtime   Yes Historical Provider, MD  PHENobarbital (LUMINAL) 64.8 MG tablet Take 194.4 mg by mouth at bedtime.   Yes Historical Provider, MD   BP 132/74  Pulse 64  Temp(Src) 99 F (37.2 C) (Oral)  Resp 15  Wt 184 lb 4.8 oz (83.598 kg)  SpO2 97%  Physical Exam  Nursing note and vitals reviewed. Constitutional: She is oriented to person, place, and time. She appears well-developed and well-nourished.  HENT:  Head: Normocephalic and atraumatic.  Right Ear: External ear normal.  Left Ear: External ear normal.  Nose: Nose normal.  Mouth/Throat: Oropharynx is clear and moist.  Eyes: Conjunctivae and EOM are normal. Pupils are equal, round, and reactive to light.  Neck: Normal range of motion. Neck supple. No JVD present. No tracheal deviation present. No thyromegaly present.  Cardiovascular: Normal rate, regular rhythm, normal heart sounds and intact distal pulses.  Exam reveals no gallop and no  friction rub.   No murmur heard. Pulmonary/Chest: Effort normal and breath sounds normal. No stridor. No respiratory distress. She has no wheezes. She has no rales. She exhibits no tenderness.  Abdominal: Soft. Bowel sounds are normal. She exhibits no distension and no mass. There is no tenderness. There is no rebound and no guarding.  Musculoskeletal: Normal range of motion. She exhibits no edema and no tenderness.  Lymphadenopathy:    She has no cervical adenopathy.  Neurological: She is alert and oriented to person, place, and time. She has normal reflexes. No cranial nerve deficit. She exhibits normal muscle tone. Coordination (patient has slightly abnormal finger-nose-finger and heel shin) abnormal.  Skin: Skin is warm and dry. No rash noted. No erythema. No pallor.  Psychiatric: She has a normal mood and affect. Her behavior is normal. Judgment and thought content normal.    ED Course  Procedures (including critical care time) Labs Review Labs Reviewed  DIFFERENTIAL - Abnormal; Notable for the following:    Neutrophils Relative % 29 (*)    Neutro Abs 1.4 (*)    Lymphocytes Relative 57 (*)    All other components within normal limits  COMPREHENSIVE METABOLIC PANEL - Abnormal; Notable for the following:    Potassium 3.4 (*)    Glucose, Bld 115 (*)    Alkaline Phosphatase 125 (*)    Total Bilirubin <0.2 (*)    All other components within normal limits  PHENOBARBITAL LEVEL - Abnormal; Notable for the following:    Phenobarbital 47.0 (*)    All other components within normal limits  URINE RAPID DRUG SCREEN (HOSP PERFORMED) - Abnormal; Notable for the following:    Barbiturates POSITIVE (*)    All other components within normal limits  CBG MONITORING, ED - Abnormal; Notable for the following:    Glucose-Capillary 121 (*)    All other components within normal limits  I-STAT CHEM 8, ED - Abnormal; Notable for the following:    Potassium 3.1 (*)    Glucose, Bld 122 (*)    All  other components within normal limits  PROTIME-INR  APTT  CBC  ETHANOL  URINALYSIS, ROUTINE W REFLEX MICROSCOPIC  I-STAT TROPOININ, ED    Imaging Review Ct Head (brain) Wo Contrast  08/01/2013   CLINICAL DATA:  Dizziness and facial droop. Code stroke. Status post fall out of bed.  EXAM: CT HEAD WITHOUT CONTRAST  TECHNIQUE: Contiguous axial images were obtained from the base of the skull through the vertex without intravenous contrast.  COMPARISON:  CT of the head performed 04/29/2010  FINDINGS: There is no evidence of acute infarction, mass lesion, or intra- or extra-axial hemorrhage on CT.  Mild periventricular white matter change likely reflects small vessel ischemic microangiopathy.  The posterior fossa, including the cerebellum, brainstem and fourth ventricle, is within normal limits. The third and lateral ventricles, and basal ganglia are unremarkable in  appearance. The cerebral hemispheres are symmetric in appearance, with normal gray-white differentiation. No mass effect or midline shift is seen.  There is no evidence of fracture; visualized osseous structures are unremarkable in appearance. Mild bilateral proptosis is noted. The paranasal sinuses and mastoid air cells are well-aerated. No significant soft tissue abnormalities are seen.  IMPRESSION: 1. No acute intracranial pathology seen on CT. 2. Mild small vessel ischemic microangiopathy. 3. Mild bilateral proptosis noted.  These results were called by telephone at the time of interpretation on 08/01/2013 at 1:30 am to Dr. Linton Flemings, who verbally acknowledged these results.   Electronically Signed   By: Garald Balding M.D.   On: 08/01/2013 01:30     EKG Interpretation   Date/Time:  Monday August 01 2013 01:14:21 EDT Ventricular Rate:  73 PR Interval:  224 QRS Duration: 116 QT Interval:  428 QTC Calculation: 472 R Axis:   24 Text Interpretation:  Sinus rhythm Prolonged PR interval Incomplete right  bundle branch block Anteroseptal  infarct, age indeterminate Confirmed by  Dyasia Firestine  MD, Mattheo Swindle ([id number]) on 08/01/2013 5:44:21 AM      MDM   Final diagnoses:  Phenobarbital overdose, accidental or unintentional, initial encounter    54 year old female initially called out as a code stroke.  Code stroke was canceled.  Patient with some ataxia after accidental overdose of phenobarbital.  Her workup here in the emergency room and has not shown any other cause for her symptoms.  She does have slightly elevated phenobarbital level.  During her evaluation, symptoms have improved somewhat.  She is stable for discharge home.   Kalman Drape, MD 08/01/13 [phone]

## 2013-08-01 NOTE — ED Notes (Signed)
Patient arrived via POV from home.  Family states she was not acting right, gait was unsteady, had fallen out of the bed and broke the night stand.  Family stated that she has a "droop" on the left side of her face which is normal for her.

## 2013-08-11 ENCOUNTER — Encounter: Payer: Self-pay | Admitting: Internal Medicine

## 2013-08-11 ENCOUNTER — Ambulatory Visit (INDEPENDENT_AMBULATORY_CARE_PROVIDER_SITE_OTHER): Admitting: Internal Medicine

## 2013-08-11 VITALS — BP 110/60 | HR 77 | Ht 63.0 in | Wt 202.8 lb

## 2013-08-11 DIAGNOSIS — G4710 Hypersomnia, unspecified: Secondary | ICD-10-CM

## 2013-08-11 DIAGNOSIS — G4733 Obstructive sleep apnea (adult) (pediatric): Secondary | ICD-10-CM

## 2013-08-11 NOTE — Progress Notes (Signed)
10/28/12- 61 yoF never smoker referred courtesy of Dr Valetta Fuller sleep study at [facility] clinic about 1.5 years ago.  Onset in last few years of loud snore, irresistible daytime sleepiness. Current CPAP/ [facility] not helping.  Home unattended sleep study-SNAP-04/16/11- WNL- AHI 0.2/ hr, weight 190 lbs. No opportunity to nap. Bedtime 11:30 PM, short sleep latency, waking once before up at 6:30 AM.. Hx complex partial seizures controlled long-term with chronic phenobarbital 30 mg and also on Lamictal x decades.  Hx parathyroid surgery. Still has tonsils.  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  Mother snores.  Gets flu vax from PCP.  12/10/12- 85 yoF never smoker followed for hypersomnia / OSA, complicated by seizure disorder FOLLOWS FOR:  Wearing CPAP 5 hours per night.  Still tired thourghout the day AutoPap does stop her snoring and she likes new mask better. She still complains of daytime sleepiness and we reviewed sleep hygiene. Reminded that her original sleep study in 2013 was non-diagnostic for OSA. Discussed trial of Nuvigil while we schedule formal NPSG with MSLT.  02/10/13- 47 yoF never smoker followed for hypersomnia / OSA, complicated by seizure disorder FOLLOWS FOR:  Discuss sleep study result done 01/12/13 & 01/18/13.  Wearing CPAP Auto/ [facility] 5-6 per night with no complaints Current CPAP stops most of her snoring. NPSG 01/09/13- AHI 4.8/ hr, weight 198 lbs, Sleep 359 minutes/ REM 13.9% MSLT 01/10/13- Mean latency severely short 3.8 minutes, indicating severe daytime sleepiness. However she had taken lamictal, phenobarb, duloxetine, so study is not specific for primary hypersomnia disorder like narcolepsy, although it doesn't rule that out. Never found Nuvigil helpful  04/11/13- 52 yoF never smoker followed for hypersomnia / ? OSA,   complicated by seizure disorder FOLLOWS FOR: wearing CPAP Auto/ [facility] every night for about 7 hours.  Stable daytime sleepiness especially in the  afternoon. Ritalin keeps her alert enough to work-1 is enough. Narcolepsy HLA DQB1 gene assay was negative.  08/11/13- 52 yoF never smoker followed for hypersomnia / ? OSA,   complicated by seizure disorder Follows [facility]:[id number] Cpap Auto 7-12/ [facility]  4-6 hrs/ night -  Off Ritalin due to seizures Good compliance and control on download. Now taking phenobarbital and Lamictal for epilepsy. Admits some daytime sleepiness but naps help since she isn't taking Ritalin  ROS-see HPI Constitutional:   No-   weight loss, night sweats, fevers, chills, +fatigue, lassitude. HEENT:   No-  headaches, difficulty swallowing, tooth/dental problems, sore throat,       No-  sneezing, itching, ear ache, nasal congestion, post nasal drip,  CV:  No-   chest pain, orthopnea, PND, swelling in lower extremities, anasarca, dizziness, palpitations Resp: No-   shortness of breath with exertion or at rest.              No-   productive cough,  No non-productive cough,  No- coughing up of blood.              No-   change in color of mucus.  No- wheezing.   Skin: No-   rash or lesions. GI:  No-   heartburn, indigestion, abdominal pain, nausea, vomiting,  GU:  MS:  No-   joint pain or swelling.   Neuro-     nothing unusual Psych:  No- change in mood or affect. No depression or anxiety.  No memory loss.  OBJ- Physical Exam General- Alert, Oriented, Affect-appropriate, Distress- none acute, +overweight Skin- rash-none, lesions- none, excoriation- none Lymphadenopathy- none Head- atraumatic  Eyes- Gross vision intact, PERRLA, conjunctivae and secretions clear            Ears- Hearing, canals-normal            Nose- Clear, no-Septal dev, mucus, polyps, erosion, perforation             Throat- Mallampati III-IV , mucosa clear , drainage- none, tonsils- atrophic Neck- Full, trachea midline, no stridor , thyroid nl, carotid no bruit Chest - symmetrical excursion , unlabored           Heart/CV- RRR , no murmur ,  no gallop  , no rub, nl s1 s2                           - JVD- none , edema- none, stasis changes- none, varices- none           Lung- clear to P&A, wheeze- none, cough- none , dullness-none, rub- none           Chest wall-  Abd-  Br/ Gen/ Rectal- Not done, not indicated Extrem- cyanosis- none, clubbing, none, atrophy- none, strength- nl Neuro- grossly intact to observation

## 2013-08-11 NOTE — Patient Instructions (Signed)
Order [facility] DME download CPAP for pressure compliance    Dx OSA

## 2013-11-20 NOTE — Assessment & Plan Note (Signed)
Mostly a medication effect. She uses. Unable to use stimulant medicines because of her epilepsy

## 2013-11-20 NOTE — Assessment & Plan Note (Signed)
Doing well with CPAP on autotitration. Plan-consider change to fixed pressure based on download.

## 2014-01-09 ENCOUNTER — Encounter: Payer: Self-pay | Admitting: Internal Medicine

## 2014-01-09 ENCOUNTER — Ambulatory Visit (INDEPENDENT_AMBULATORY_CARE_PROVIDER_SITE_OTHER): Admitting: Internal Medicine

## 2014-01-09 VITALS — BP 152/72 | HR 80 | Ht 63.0 in | Wt 213.2 lb

## 2014-01-09 DIAGNOSIS — G4710 Hypersomnia, unspecified: Secondary | ICD-10-CM

## 2014-01-09 DIAGNOSIS — G4733 Obstructive sleep apnea (adult) (pediatric): Secondary | ICD-10-CM

## 2014-01-09 DIAGNOSIS — G40209 Localization-related (focal) (partial) symptomatic epilepsy and epileptic syndromes with complex partial seizures, not intractable, without status epilepticus: Secondary | ICD-10-CM

## 2014-01-09 NOTE — Progress Notes (Signed)
10/28/12- 83 yoF never smoker referred courtesy of Dr Valetta Fuller sleep study at [facility] clinic about 1.5 years ago.  Onset in last few years of loud snore, irresistible daytime sleepiness. Current CPAP/ [facility] not helping.  Home unattended sleep study-SNAP-04/16/11- WNL- AHI 0.2/ hr, weight 190 lbs. No opportunity to nap. Bedtime 11:30 PM, short sleep latency, waking once before up at 6:30 AM.. Hx complex partial seizures controlled long-term with chronic phenobarbital 30 mg and also on Lamictal x decades.  Hx parathyroid surgery. Still has tonsils.  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  Mother snores.  Gets flu vax from PCP.  12/10/12- 57 yoF never smoker followed for hypersomnia / OSA, complicated by seizure disorder FOLLOWS FOR:  Wearing CPAP 5 hours per night.  Still tired thourghout the day AutoPap does stop her snoring and she likes new mask better. She still complains of daytime sleepiness and we reviewed sleep hygiene. Reminded that her original sleep study in 2013 was non-diagnostic for OSA. Discussed trial of Nuvigil while we schedule formal NPSG with MSLT.  02/10/13- 6 yoF never smoker followed for hypersomnia / OSA, complicated by seizure disorder FOLLOWS FOR:  Discuss sleep study result done 01/12/13 & 01/18/13.  Wearing CPAP Auto/ [facility] 5-6 per night with no complaints Current CPAP stops most of her snoring. NPSG 01/09/13- AHI 4.8/ hr, weight 198 lbs, Sleep 359 minutes/ REM 13.9% MSLT 01/10/13- Mean latency severely short 3.8 minutes, indicating severe daytime sleepiness. However she had taken lamictal, phenobarb, duloxetine, so study is not specific for primary hypersomnia disorder like narcolepsy, although it doesn't rule that out. Never found Nuvigil helpful  04/11/13- 52 yoF never smoker followed for hypersomnia / ? OSA,   complicated by seizure disorder FOLLOWS FOR: wearing CPAP Auto/ [facility] every night for about 7 hours.  Stable daytime sleepiness especially in the  afternoon. Ritalin keeps her alert enough to work-1 is enough. Narcolepsy HLA DQB1 gene assay was negative.  08/11/13- 52 yoF never smoker followed for hypersomnia / ? OSA,   complicated by seizure disorder Follows OAC:[id number] Cpap Auto 7-12/ [facility]  4-6 hrs/ night -  Off Ritalin due to seizures Good compliance and control on download. Now taking phenobarbital and Lamictal for epilepsy. Admits some daytime sleepiness but naps help since she isn't taking Ritalin  01/09/14- 52 yoF never smoker followed for hypersomnia / ? OSA,   complicated by seizure disorder FOLLOWS FOR: Wears CPAP auto 7-12/ [facility] every night for about 6 hours; has noticed the mask comes off at times and/or goes into her mouth. DME is [facility].  On phenobarb, lamictal. Drinks tea in the morning for caffeine. Feels very tired/sleepy around 1:30 PM and is feeling last 20 or 30 minutes. Activity helps. Worse if sitting. Does not usually nap. Stopped Ritalin because it interacted with her seizure med. Failed Nuvigil and declines trial of Adderall She owns her CPAP machine and continues to use it. Download shows excellent compliance and control.  ROS-see HPI Constitutional:   No-   weight loss, night sweats, fevers, chills, +fatigue, lassitude. HEENT:   No-  headaches, difficulty swallowing, tooth/dental problems, sore throat,       No-  sneezing, itching, ear ache, nasal congestion, post nasal drip,  CV:  No-   chest pain, orthopnea, PND, swelling in lower extremities, anasarca, dizziness, palpitations Resp: No-   shortness of breath with exertion or at rest.              No-   productive cough,  No non-productive  cough,  No- coughing up of blood.              No-   change in color of mucus.  No- wheezing.   Skin: No-   rash or lesions. GI:  No-   heartburn, indigestion, abdominal pain, nausea, vomiting,  GU:  MS:  No-   joint pain or swelling.   Neuro-     nothing unusual Psych:  No- change in mood or affect. No depression  or anxiety.  No memory loss.  OBJ- Physical Exam General- Alert, Oriented, Affect-appropriate, Distress- none acute, +overweight Skin- rash-none, lesions- none, excoriation- none Lymphadenopathy- none Head- atraumatic            Eyes- Gross vision intact, PERRLA, conjunctivae and secretions clear            Ears- Hearing, canals-normal            Nose- Clear, no-Septal dev, mucus, polyps, erosion, perforation             Throat- Mallampati III-IV , mucosa clear , drainage- none, tonsils- atrophic Neck- Full, trachea midline, no stridor , thyroid nl, carotid no bruit Chest - symmetrical excursion , unlabored           Heart/CV- RRR , no murmur , no gallop  , no rub, nl s1 s2                           - JVD- none , edema- none, stasis changes- none, varices- none           Lung- clear to P&A, wheeze- none, cough- none , dullness-none, rub- none           Chest wall-  Abd-  Br/ Gen/ Rectal- Not done, not indicated Extrem- cyanosis- none, clubbing, none, atrophy- none, strength- nl Neuro- grossly intact to observation

## 2014-01-09 NOTE — Patient Instructions (Signed)
Letter for employer written  Ok to continue as at present.  Please call as needed

## 2014-01-15 DIAGNOSIS — G40209 Localization-related (focal) (partial) symptomatic epilepsy and epileptic syndromes with complex partial seizures, not intractable, without status epilepticus: Secondary | ICD-10-CM | POA: Insufficient documentation

## 2014-01-15 NOTE — Assessment & Plan Note (Signed)
She feels controlled. We discussed impact of her related medications to sense of daytime sleepiness.

## 2014-01-15 NOTE — Assessment & Plan Note (Signed)
She was given CPAP although available documentation does not support diagnosis of obstructive sleep apnea. She continues to use CPAP with good compliance and control by download. The objective abnormality is significant daytime sleepiness, nonspecific because of her seizure meds. She asks for a letter to her employer about difficulty sitting quietly and maintaining alertness.

## 2014-01-15 NOTE — Assessment & Plan Note (Addendum)
Documentation wasn't specific for pattern because it was done while she was on potentially sedating medications. However daytime sleepiness was documented. Her responsibility to drive safely reinforced. Letter to her employer.

## 2014-04-26 ENCOUNTER — Encounter: Payer: Self-pay | Admitting: Internal Medicine

## 2014-07-11 ENCOUNTER — Ambulatory Visit (INDEPENDENT_AMBULATORY_CARE_PROVIDER_SITE_OTHER): Admitting: Internal Medicine

## 2014-07-11 ENCOUNTER — Encounter: Payer: Self-pay | Admitting: Internal Medicine

## 2014-07-11 VITALS — BP 132/64 | HR 70 | Ht 63.0 in | Wt 204.0 lb

## 2014-07-11 DIAGNOSIS — G4710 Hypersomnia, unspecified: Secondary | ICD-10-CM

## 2014-07-11 DIAGNOSIS — G4733 Obstructive sleep apnea (adult) (pediatric): Secondary | ICD-10-CM

## 2014-07-11 NOTE — Progress Notes (Signed)
10/28/12- 62 yoF never smoker referred courtesy of Dr Valetta Fuller sleep study at [facility] clinic about 1.5 years ago.  Onset in last few years of loud snore, irresistible daytime sleepiness. Current CPAP/ [facility] not helping.  Home unattended sleep study-SNAP-04/16/11- WNL- AHI 0.2/ hr, weight 190 lbs. No opportunity to nap. Bedtime 11:30 PM, short sleep latency, waking once before up at 6:30 AM.. Hx complex partial seizures controlled long-term with chronic phenobarbital 30 mg and also on Lamictal x decades.  Hx parathyroid surgery. Still has tonsils.  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  Mother snores.  Gets flu vax from PCP.  12/10/12- 72 yoF never smoker followed for hypersomnia / OSA, complicated by seizure disorder FOLLOWS FOR:  Wearing CPAP 5 hours per night.  Still tired thourghout the day AutoPap does stop her snoring and she likes new mask better. She still complains of daytime sleepiness and we reviewed sleep hygiene. Reminded that her original sleep study in 2013 was non-diagnostic for OSA. Discussed trial of Nuvigil while we schedule formal NPSG with MSLT.  02/10/13- 27 yoF never smoker followed for hypersomnia / OSA, complicated by seizure disorder FOLLOWS FOR:  Discuss sleep study result done 01/12/13 & 01/18/13.  Wearing CPAP Auto/ [facility] 5-6 per night with no complaints Current CPAP stops most of her snoring. NPSG 01/09/13- AHI 4.8/ hr, weight 198 lbs, Sleep 359 minutes/ REM 13.9% MSLT 01/10/13- Mean latency severely short 3.8 minutes, indicating severe daytime sleepiness. However she had taken lamictal, phenobarb, duloxetine, so study is not specific for primary hypersomnia disorder like narcolepsy, although it doesn't rule that out. Never found Nuvigil helpful  04/11/13- 52 yoF never smoker followed for hypersomnia / ? OSA,   complicated by seizure disorder FOLLOWS FOR: wearing CPAP Auto/ [facility] every night for about 7 hours.  Stable daytime sleepiness especially in the  afternoon. Ritalin keeps her alert enough to work-1 is enough. Narcolepsy HLA DQB1 gene assay was negative.  08/11/13- 52 yoF never smoker followed for hypersomnia / ? OSA,   complicated by seizure disorder Follows [id number] Cpap Auto 7-12/ [facility]  4-6 hrs/ night -  Off Ritalin due to seizures Good compliance and control on download. Now taking phenobarbital and Lamictal for epilepsy. Admits some daytime sleepiness but naps help since she isn't taking Ritalin  01/09/14- 52 yoF never smoker followed for hypersomnia / ? OSA,   complicated by seizure disorder FOLLOWS FOR: Wears CPAP auto 7-12/ [facility] every night for about 6 hours; has noticed the mask comes off at times and/or goes into her mouth. DME is [facility].  On phenobarb, lamictal. Drinks tea in the morning for caffeine. Feels very tired/sleepy around 1:30 PM and is feeling last 20 or 30 minutes. Activity helps. Worse if sitting. Does not usually nap. Stopped Ritalin because it interacted with her seizure med. Failed Nuvigil and declines trial of Adderall She owns her CPAP machine and continues to use it. Download shows excellent compliance and control.  07/11/14- 80 yoF never smoker followed for primary hypersomnia / ? OSA,   complicated by seizure disorder FOLLOWS FOR: Pt states she is wearing her CPAP auto 7-12 nightly for about 5 hours. Pt denies issues with mask, pressure or machine. Pt states she feels the B12 has helped with energy throughout the day, pt requesting CY's input.  CPAP download confirms excellent compliance and control. 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 She  feels that CPAP helps her-loud snore without it and tireder without it, so we will continue as long as she already has it. Nuvigil was no help and Ritalin interacted with her seizure meds. She can nap when needed.   ROS-see  HPI Constitutional:   No-   weight loss, night sweats, fevers, chills, +fatigue, lassitude. HEENT:   No-  headaches, difficulty swallowing, tooth/dental problems, sore throat,       No-  sneezing, itching, ear ache, nasal congestion, post nasal drip,  CV:  No-   chest pain, orthopnea, PND, swelling in lower extremities, anasarca, dizziness, palpitations Resp: No-   shortness of breath with exertion or at rest.              No-   productive cough,  No non-productive cough,  No- coughing up of blood.              No-   change in color of mucus.  No- wheezing.   Skin: No-   rash or lesions. GI:  No-   heartburn, indigestion, abdominal pain, nausea, vomiting,  GU:  MS:  No-   joint pain or swelling.   Neuro-     nothing unusual Psych:  No- change in mood or affect. No depression or anxiety.  No memory loss.  OBJ- Physical Exam General- Alert, Oriented, Affect-appropriate, Distress- none acute, +overweight Skin- rash-none, lesions- none, excoriation- none Lymphadenopathy- none Head- atraumatic            Eyes- Gross vision intact, PERRLA, conjunctivae and secretions clear            Ears- Hearing, canals-normal            Nose- Clear, no-Septal dev, mucus, polyps, erosion, perforation             Throat- Mallampati III-IV , mucosa clear , drainage- none, tonsils- atrophic Neck- Full, trachea midline, no stridor , thyroid nl, carotid no bruit Chest - symmetrical excursion , unlabored           Heart/CV- RRR , no murmur , no gallop  , no rub, nl s1 s2                           - JVD- none , edema- none, stasis changes- none, varices- none           Lung- clear to P&A, wheeze- none, cough- none , dullness-none, rub- none           Chest wall-  Abd-  Br/ Gen/ Rectal- Not done, not indicated Extrem- cyanosis- none, clubbing, none, atrophy- none, strength- nl Neuro- grossly intact to observation

## 2014-07-11 NOTE — Patient Instructions (Signed)
We can continue CPAP auto 7-12/ [facility] since you find it helps.  Consider trying otc caffeine tablets, like NoDoz or Vivarin (store brand is fine). 1/2 or 1 occasionally, once or twice daily if needed  If you need an antihistamine for allergy, try a non-sedating one like claritin or allegra

## 2014-07-16 NOTE — Assessment & Plan Note (Signed)
Idiopathic hypersomnia, possibly some sedation from her seizure meds, possibly some sleep disturbance from variable obstructive sleep apnea Plan-naps are good. She can try caffeine tablet occasionally as discussed. Medication limitations discussed above.

## 2014-07-16 NOTE — Assessment & Plan Note (Signed)
Without CPAP she is tireder and snores loudly so it is helping obstructive component even though we weren't able to document with a PSG yet.

## 2014-07-28 ENCOUNTER — Encounter: Payer: Self-pay | Admitting: Internal Medicine

## 2015-03-02 ENCOUNTER — Telehealth: Payer: Self-pay | Admitting: Internal Medicine

## 2015-03-02 NOTE — Telephone Encounter (Signed)
Spoke with pt, states that X1 wk pt has noticed increased fatigue- fell asleep after parking her car in a parking lot.  Pt states she's been under a lot of stress with school work, but wonders if something more is going on.  Pt has also been taking OMNI caffeinated energy drink instead of caffeine tabs.   Pt unsure if she needs to be seen before her next rov- 07/11/15, or if CY has any recommendations for her.    CY please advise.  Thanks!   Allergies  Allergen Reactions  . Iodinated Diagnostic Agents Hypertension  . Ritalin [Methylphenidate Hcl]     Seizure   Current Outpatient Prescriptions on File Prior to Visit  Medication Sig Dispense Refill  . amLODipine (NORVASC) 5 MG tablet Take 5 mg by mouth daily.    Marland Kitchen amLODipine-valsartan (EXFORGE) 5-160 MG per tablet Take 1 tablet by mouth daily. 5-160-12.5 mg tabs    . aspirin 81 MG tablet Take 81 mg by mouth daily.    . Cyanocobalamin 1000 MCG CAPS Take 2,000 mg by mouth daily.    . DULoxetine (CYMBALTA) 60 MG capsule Take 60 mg by mouth daily.    Marland Kitchen ezetimibe-simvastatin (VYTORIN) 10-20 MG per tablet Take 1 tablet by mouth at bedtime.    . lamoTRIgine (LAMICTAL) 200 MG tablet Take 200-400 mg by mouth See admin instructions. Take 1 tablet in the morning, 1 tablet in the afternoon and 2 tablets at bedtime    . linagliptin (TRADJENTA) 5 MG TABS tablet Take 5 mg by mouth daily.    Marland Kitchen PHENobarbital (LUMINAL) 64.8 MG tablet Take 194.4 mg by mouth at bedtime.     No current facility-administered medications on file prior to visit.

## 2015-03-05 NOTE — Telephone Encounter (Signed)
Spoke with patient-states she remembers the day of falling asleep she was speaking about-she did not take her caffeine as usual nor her Vit B 12 tablet. 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   Per CY-he recommends for patient to take a dose of caffeine, nap as able(break time,lunch time,etc during the day), and try to de-stress as able.   Call sooner if needed.

## 2015-03-05 NOTE — Telephone Encounter (Signed)
Pt is aware and nothing more needed at this time.  

## 2015-07-11 ENCOUNTER — Encounter: Payer: Self-pay | Admitting: Internal Medicine

## 2015-07-11 ENCOUNTER — Ambulatory Visit (INDEPENDENT_AMBULATORY_CARE_PROVIDER_SITE_OTHER): Admitting: Internal Medicine

## 2015-07-11 VITALS — BP 122/70 | HR 70 | Ht 64.0 in | Wt 198.6 lb

## 2015-07-11 DIAGNOSIS — G4733 Obstructive sleep apnea (adult) (pediatric): Secondary | ICD-10-CM | POA: Diagnosis not present

## 2015-07-11 DIAGNOSIS — G4710 Hypersomnia, unspecified: Secondary | ICD-10-CM

## 2015-07-11 NOTE — Patient Instructions (Signed)
Order- DME [facility]- please provide pressure compliance download   Dx OSA                                     Please evaluate eligibility for replacement of old CPAP machine, continue mask of choice, humidifier, supplies, AirView

## 2015-07-11 NOTE — Progress Notes (Signed)
10/28/12- 55 yoF never smoker referred courtesy of Dr Valetta Fuller sleep study at [facility] clinic about 1.5 years ago.  Onset in last few years of loud snore, irresistible daytime sleepiness. Current CPAP/ [facility] not helping.  Home unattended sleep study-SNAP-04/16/11- WNL- AHI 0.2/ hr, weight 190 lbs. No opportunity to nap. Bedtime 11:30 PM, short sleep latency, waking once before up at 6:30 AM.. Hx complex partial seizures controlled long-term with chronic phenobarbital 30 mg and also on Lamictal x decades.  Hx parathyroid surgery. Still has tonsils.  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  Mother snores.  Gets flu vax from PCP.  12/10/12- 55 yoF never smoker followed for hypersomnia / OSA, complicated by seizure disorder FOLLOWS FOR:  Wearing CPAP 5 hours per night.  Still tired thourghout the day AutoPap does stop her snoring and she likes new mask better. She still complains of daytime sleepiness and we reviewed sleep hygiene. Reminded that her original sleep study in 2013 was non-diagnostic for OSA. Discussed trial of Nuvigil while we schedule formal NPSG with [facility].  02/10/13- 55 yoF never smoker followed for hypersomnia / OSA, complicated by seizure disorder FOLLOWS FOR:  Discuss sleep study result done 01/12/13 & 01/18/13.  Wearing CPAP Auto/ [facility] 5-6 per night with no complaints Current CPAP stops most of her snoring. NPSG 01/09/13- AHI 4.8/ hr, weight 198 lbs, Sleep 359 minutes/ REM 13.9% MSLT 01/10/13- Mean latency severely short 3.8 minutes, indicating severe daytime sleepiness. However she had taken lamictal, phenobarb, duloxetine, so study is not specific for primary hypersomnia disorder like narcolepsy, although it doesn't rule that out. Never found Nuvigil helpful  04/11/13- 52 yoF never smoker followed for hypersomnia / ? OSA,   complicated by seizure disorder FOLLOWS FOR: wearing CPAP Auto/ [employer] every night for about 7 hours.  Stable daytime sleepiness especially in the  afternoon. Ritalin keeps her alert enough to work-1 is enough. Narcolepsy HLA DQB1 gene assay was negative.  08/11/13- 52 yoF never smoker followed for hypersomnia / ? OSA,   complicated by seizure disorder Follows [id number] Cpap Auto 7-12/ [facility]  4-6 hrs/ night -  Off Ritalin due to seizures Good compliance and control on download. Now taking phenobarbital and Lamictal for epilepsy. Admits some daytime sleepiness but naps help since she isn't taking Ritalin  01/09/14- 55 yoF never smoker followed for hypersomnia / ? OSA,   complicated by seizure disorder FOLLOWS FOR: Wears CPAP auto 7-12/ [facility] every night for about 6 hours; has noticed the mask comes off at times and/or goes into her mouth. DME is [employer].  On phenobarb, lamictal. Drinks tea in the morning for caffeine. Feels very tired/sleepy around 1:30 PM and is feeling last 20 or 30 minutes. Activity helps. Worse if sitting. Does not usually nap. Stopped Ritalin because it interacted with her seizure med. Failed Nuvigil and declines trial of Adderall She owns her CPAP machine and continues to use it. Download shows excellent compliance and control.  07/11/14- 55 yoF never smoker followed for primary hypersomnia / ? OSA,   complicated by seizure disorder FOLLOWS FOR: Pt states she is wearing her CPAP auto 7-12 nightly for about 5 hours. Pt denies issues with mask, pressure or machine. Pt states she feels the B12 has helped with energy throughout the day, pt requesting CY's input.  CPAP download confirms excellent compliance and control. 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 She  feels that CPAP helps her-loud snore without it and tireder without it, so we will continue as long as she already has it. Nuvigil was no help and Ritalin interacted with her seizure meds. She can nap when  needed.  07/11/2015-55 year old female never smoker followed for primary hypersomnia/? OSA, complicated by seizure disorder CPAP auto 7-12/[facility] FOLLOW FOR: Patient states that she uses CPAP machine every night, no download in [place], pt states [employer] was supposed to send download.  discuss energy drink that she has been drinking, make sure ok to drink. Last available download had shown great compliance and control. Now snoring through her CPAP auto 7-12 with fullface mask. Machine is getting older. We don't have current download this time. Control and compliance have been quite good but she has been feeling a little tired recently. Uses an energy drink with 160 mg of caffeine, 1 daily  ROS-see HPI Constitutional:   No-   weight loss, night sweats, fevers, chills, +fatigue, lassitude. HEENT:   No-  headaches, difficulty swallowing, tooth/dental problems, sore throat,       No-  sneezing, itching, ear ache, nasal congestion, post nasal drip,  CV:  No-   chest pain, orthopnea, PND, swelling in lower extremities, anasarca, dizziness, palpitations Resp: No-   shortness of breath with exertion or at rest.              No-   productive cough,  No non-productive cough,  No- coughing up of blood.              No-   change in color of mucus.  No- wheezing.   Skin: No-   rash or lesions. GI:  No-   heartburn, indigestion, abdominal pain, nausea, vomiting,  GU:  MS:  No-   joint pain or swelling.   Neuro-     nothing unusual Psych:  No- change in mood or affect. No depression or anxiety.  No memory loss.  OBJ- Physical Exam General- Alert, Oriented, Affect-appropriate, Distress- none acute, +overweight Skin- rash-none, lesions- none, excoriation- none Lymphadenopathy- none Head- atraumatic            Eyes- Gross vision intact, PERRLA, conjunctivae and secretions clear            Ears- Hearing, canals-normal            Nose- Clear, no-Septal dev, mucus, polyps, erosion, perforation              Throat- Mallampati III-IV , mucosa clear , drainage- none, tonsils- atrophic Neck- Full, trachea midline, no stridor , thyroid nl, carotid no bruit Chest - symmetrical excursion , unlabored           Heart/CV- RRR , no murmur , no gallop  , no rub, nl s1 s2                           - JVD- none , edema- none, stasis changes- none, varices- none           Lung- clear to P&A, wheeze- none, cough- none , dullness-none, rub- none           Chest wall-  Abd-  Br/ Gen/ Rectal- Not done, not indicated Extrem- cyanosis- none, clubbing, none, atrophy- none, strength- nl Neuro- grossly intact to observation

## 2015-07-26 ENCOUNTER — Encounter: Payer: Self-pay | Admitting: Internal Medicine

## 2015-07-31 NOTE — Assessment & Plan Note (Signed)
Continues describe excessive daytime sleepiness despite good control with CPAP. Plan-request download. Again discussed good sleep habits and appropriate use of naps.

## 2015-07-31 NOTE — Assessment & Plan Note (Signed)
Control and compliance had been good. Long-standing history of excessive daytime sleepiness has been evaluated in the past. Plan-evaluate for replacement of CPAP machine and mask. Request download

## 2015-10-01 ENCOUNTER — Ambulatory Visit (INDEPENDENT_AMBULATORY_CARE_PROVIDER_SITE_OTHER): Admitting: Neurology

## 2015-10-01 ENCOUNTER — Encounter: Payer: Self-pay | Admitting: Neurology

## 2015-10-01 VITALS — BP 128/77 | HR 69 | Ht 64.0 in | Wt 192.2 lb

## 2015-10-01 DIAGNOSIS — G40209 Localization-related (focal) (partial) symptomatic epilepsy and epileptic syndromes with complex partial seizures, not intractable, without status epilepticus: Secondary | ICD-10-CM | POA: Diagnosis not present

## 2015-10-01 DIAGNOSIS — M5442 Lumbago with sciatica, left side: Secondary | ICD-10-CM

## 2015-10-01 MED ORDER — DULOXETINE HCL 60 MG PO CPEP: 60.0000 mg | ORAL_CAPSULE | Freq: Every day | ORAL | 1 refills | Status: DC

## 2015-10-01 MED ORDER — LAMOTRIGINE 200 MG PO TABS
200.0000 mg | ORAL_TABLET | ORAL | 4 refills | Status: DC
Start: 2015-10-01 — End: 2015-10-01

## 2015-10-01 MED ORDER — LAMOTRIGINE 200 MG PO TABS: ORAL_TABLET | ORAL | 1 refills | Status: DC

## 2015-10-01 MED ORDER — PHENOBARBITAL 64.8 MG PO TABS: 194.4000 mg | ORAL_TABLET | Freq: Every day | ORAL | 4 refills | Status: DC

## 2015-10-01 MED ORDER — LAMOTRIGINE 200 MG PO TABS: ORAL_TABLET | ORAL | 4 refills | Status: DC

## 2015-10-01 MED ORDER — PHENOBARBITAL 64.8 MG PO TABS: 194.4000 mg | ORAL_TABLET | Freq: Every day | ORAL | 1 refills | Status: DC

## 2015-10-01 MED ORDER — DULOXETINE HCL 60 MG PO CPEP: 60.0000 mg | ORAL_CAPSULE | Freq: Every day | ORAL | 4 refills | Status: DC
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
## 2015-10-19 ENCOUNTER — Ambulatory Visit
Admission: RE | Admit: 2015-10-19 | Discharge: 2015-10-19 | Disposition: A | Discharge status: Home / Self Care | Source: Ambulatory Visit | Attending: Neurology | Admitting: Neurology

## 2015-10-19 DIAGNOSIS — G40209 Localization-related (focal) (partial) symptomatic epilepsy and epileptic syndromes with complex partial seizures, not intractable, without status epilepticus: Secondary | ICD-10-CM | POA: Diagnosis not present

## 2015-10-19 MED ORDER — GADOBENATE DIMEGLUMINE 529 MG/ML IV SOLN
18.0000 mL | Freq: Once | INTRAVENOUS | Status: AC | PRN
  Administered 2015-10-19: 18 mL via INTRAVENOUS

## 2015-10-25 ENCOUNTER — Ambulatory Visit (INDEPENDENT_AMBULATORY_CARE_PROVIDER_SITE_OTHER): Admitting: Neurology

## 2015-10-25 DIAGNOSIS — G40209 Localization-related (focal) (partial) symptomatic epilepsy and epileptic syndromes with complex partial seizures, not intractable, without status epilepticus: Secondary | ICD-10-CM | POA: Diagnosis not present

## 2015-10-29 NOTE — Procedures (Signed)
   HISTORY: 55 years old female, is history of epilepsy since age 55, on polypharmacy treatment lamotrigine, phenobarbital. TECHNIQUE:  16 channel EEG was performed based on standard 10-16 international system. One channel was dedicated to EKG, which has demonstrates normal sinus rhythm of beats per minutes.  Upon awakening, the posterior background activity was well-developed, in alpha range, with amplitude of microvoltage, reactive to eye opening and closure.  There was no evidence of epileptiform discharge.  Photic stimulation was performed, which induced a symmetric photic driving.  Hyperventilation was performed, there was no abnormality elicit.  Stage II sleep was achieved.  CONCLUSION: This is a normal awake and asleep EEG.  There is no electrodiagnostic evidence of epileptiform discharge.  Marcial Pacas, M.D. Ph.D.  [facility] [place], [place] [address] Phone: [phone] Fax:      [phone]

## 2015-11-29 ENCOUNTER — Encounter: Payer: Self-pay | Admitting: Neurology

## 2015-11-29 ENCOUNTER — Ambulatory Visit (INDEPENDENT_AMBULATORY_CARE_PROVIDER_SITE_OTHER): Admitting: Neurology

## 2015-11-29 VITALS — BP 131/66 | HR 81 | Ht 64.0 in | Wt 191.0 lb

## 2015-11-29 DIAGNOSIS — G4733 Obstructive sleep apnea (adult) (pediatric): Secondary | ICD-10-CM

## 2015-11-29 DIAGNOSIS — G40209 Localization-related (focal) (partial) symptomatic epilepsy and epileptic syndromes with complex partial seizures, not intractable, without status epilepticus: Secondary | ICD-10-CM | POA: Diagnosis not present

## 2015-11-29 MED ORDER — PHENOBARBITAL 16.2 MG PO TABS: 16.2000 mg | ORAL_TABLET | Freq: Every day | ORAL | 0 refills | Status: DC

## 2015-11-29 MED ORDER — DIVALPROEX SODIUM ER 500 MG PO TB24: 1000.0000 mg | ORAL_TABLET | Freq: Every day | ORAL | 11 refills | Status: DC
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
## 2015-12-03 ENCOUNTER — Telehealth: Payer: Self-pay | Admitting: Neurology

## 2015-12-03 LAB — COMPREHENSIVE METABOLIC PANEL
A/G RATIO: 1.7 (ref 1.2–2.2)
ALK PHOS: 97 IU/L (ref 39–117)
ALT: 21 IU/L (ref 0–32)
AST: 22 IU/L (ref 0–40)
Albumin: 4.7 g/dL (ref 3.5–5.5)
BUN / CREAT RATIO: 17 (ref 9–23)
BUN: 13 mg/dL (ref 6–24)
CHLORIDE: 100 mmol/L (ref 96–106)
CO2: 24 mmol/L (ref 18–29)
Calcium: 9.9 mg/dL (ref 8.7–10.2)
Creatinine, Ser: 0.75 mg/dL (ref 0.57–1.00)
GFR calc non Af Amer: 91 mL/min/{1.73_m2} (ref >59)
GFR, EST AFRICAN AMERICAN: 105 mL/min/{1.73_m2} (ref >59)
Globulin, Total: 2.7 g/dL (ref 1.5–4.5)
Glucose: 100 mg/dL — ABNORMAL HIGH (ref 65–99)
POTASSIUM: 4.1 mmol/L (ref 3.5–5.2)
Sodium: 142 mmol/L (ref 134–144)
TOTAL PROTEIN: 7.4 g/dL (ref 6.0–8.5)

## 2015-12-03 LAB — CBC
Hematocrit: 35.9 % (ref 34.0–46.6)
Hemoglobin: 12.4 g/dL (ref 11.1–15.9)
MCH: 31.5 pg (ref 26.6–33.0)
MCHC: 34.5 g/dL (ref 31.5–35.7)
MCV: 91 fL (ref 79–97)
PLATELETS: 243 10*3/uL (ref 150–379)
RBC: 3.94 x10E6/uL (ref 3.77–5.28)
RDW: 13.9 % (ref 12.3–15.4)
WBC: 3 10*3/uL — ABNORMAL LOW (ref 3.4–10.8)

## 2015-12-03 LAB — LAMOTRIGINE LEVEL: Lamotrigine Lvl: 18.2 ug/mL (ref 2.0–20.0)

## 2015-12-03 LAB — PHENOBARBITAL LEVEL: Phenobarbital, Serum: 48 ug/mL (ref 15–40)

## 2015-12-03 NOTE — Telephone Encounter (Signed)
Spoke to Dr. Krista Blue - patient does not have active bleeding now.  She has recently been placed on Depakote and Lamictal (11/29/15) which will hopefully improve her mood (she will keep [place] posted).  It is safe for her to fly.  She verbalized understanding.

## 2015-12-03 NOTE — Telephone Encounter (Signed)
Patient called to advise, family members are concerned with what Dr. Krista Blue said at last visit November 16th, has one daughter who is a [employer] scientist, concerned about bleeding on the brain, daughter states patient is moody (this was one of the questions Dr. Krista Blue asked), what does she mean by chronic?, is it safe to fly, "I'm getting ready to go to [place]".

## 2015-12-04 ENCOUNTER — Telehealth: Payer: Self-pay | Admitting: Neurology

## 2015-12-04 NOTE — Telephone Encounter (Signed)
Spoke to pt's daughter, Verdene Lennert (on HIPAA) - her mother's symptoms of feeling off balance and dizzy started this morning.  Denies confusion.  She has confirmed the following medications taken:  1) Last pm: Phenobarbital 64.8mg , 2 tablets                     Depakote ER 500mg , 1 tablet 2) This am:  Phenobarbital 64.8mg , 2 tablets                     Lamictal 200mg , 1.5 tablets  She was instructed at her last appt, on 11/16, to start tapering off phenobarbital and she has confused her dosage schedule.  States she is scheduled to fly to [place] this afternoon and is concerned about her trip.

## 2015-12-04 NOTE — Telephone Encounter (Signed)
Pt's administrator called for her, I spoke with her, she said she is having difficulty walking straight. She thinks it could be due to weaning off a medication and starting another. She is going to wait in his office for a return call,she can be reached at [phone]

## 2015-12-04 NOTE — Telephone Encounter (Signed)
I called daughter back. She is aware of recommendation below and read back to me correctly. Daughter reports that she will be with the patient all day. She will call back if any more questions and is aware to go to call 911 if patient becomes unresponsive.

## 2015-12-04 NOTE — Telephone Encounter (Signed)
Pt's daughter called back to speak with Sharyn Lull. Does the pt need to go to ER or home?  Verdene Lennert [phone]

## 2015-12-04 NOTE — Telephone Encounter (Signed)
She can resume the original tapering schedule for phenobarbital starting tomorrow evening.  She should delay flying to [facility] until Sx improve or fly with a family member.

## 2015-12-04 NOTE — Telephone Encounter (Signed)
Returned call to daughter, Verdene Lennert.  She would like a call back on her mobile number [phone].  The patient has reported the following medication schedule discussed at her last visit on 11/29/15:   1)Phenobarbital 64.8mg , 2 tabs qhs and Depakote ER       500mg , 1 tab qhs x 2 weeks.  2) Then Phenobarbital 64.8mg , 1 tab qhs x 2 weeks and      Depakote ER 500mg . 2 tabs qhs thereafter.   3) Then Phenobarbital 16.2mg , 2 tabs, qhs x 2 weeks      then stop.    She is also taking Lamictal 200mg , 1.5 tablets in am, 1 tablet in afternoon, 2 tablets at bedtime.  She is taking her Depakote ER and Lamictal doses correctly.  She mixed up the tapering schedule for her phenobarbital and took 2 tablet of the 64.8 last night and 2 additional tablets this morning.    I spoke to the patient directly.  She has the tapering instructions down and read them to me.  She is with her daughter now.  The patient is not confused but just off balance and dizzy.  They just want to confirm that it is okay to skip the evening dose of phenobarbital tonight (since she took 2 this am) and resume the ordered tapering schedule tomorrow.  She is suppose to fly to [place] this afternoon alone but her daughter said the flight can be delayed.

## 2015-12-05 NOTE — Telephone Encounter (Signed)
Patient's daughter is calling back and would like a returned call. She states the patient cannot walk, feels loopy and dizzy. She is coherent but cannot understand when talking to her.

## 2015-12-05 NOTE — Telephone Encounter (Signed)
Spoke to patient - she is in [place] with her family.  She denies confusion and is answering all my questions in an appropriate manner.  She has resumed her tapering schedule, as instructed but is still experiencing some midday dizziness.  If symptoms progress, she will go to the ED while in [place] for further evaluation.  She has an appt on 12/10/15 to be seen here by Hoyle Sauer.  Her daughter, Tammy Rodgers, will accompany her mother to this appt.

## 2015-12-10 ENCOUNTER — Ambulatory Visit (INDEPENDENT_AMBULATORY_CARE_PROVIDER_SITE_OTHER): Admitting: Nurse Practitioner

## 2015-12-10 ENCOUNTER — Encounter: Payer: Self-pay | Admitting: Nurse Practitioner

## 2015-12-10 ENCOUNTER — Encounter: Payer: Self-pay | Admitting: *Deleted

## 2015-12-10 VITALS — BP 130/79 | HR 68 | Ht 64.0 in | Wt 190.0 lb

## 2015-12-10 DIAGNOSIS — F05 Delirium due to known physiological condition: Secondary | ICD-10-CM | POA: Diagnosis not present

## 2015-12-10 DIAGNOSIS — Z5181 Encounter for therapeutic drug level monitoring: Secondary | ICD-10-CM | POA: Diagnosis not present

## 2015-12-10 DIAGNOSIS — R531 Weakness: Secondary | ICD-10-CM

## 2015-12-10 DIAGNOSIS — G40209 Localization-related (focal) (partial) symptomatic epilepsy and epileptic syndromes with complex partial seizures, not intractable, without status epilepticus: Secondary | ICD-10-CM | POA: Diagnosis not present

## 2015-12-10 DIAGNOSIS — W19XXXA Unspecified fall, initial encounter: Secondary | ICD-10-CM

## 2015-12-10 NOTE — Patient Instructions (Addendum)
Decrease Lamictal to 200 mg 2 tabs twice dailyBRAND Continue tapering schedule of phenobarbital Continue Depakote 500mg  (2) at bedtime Follow-up in one month
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
## 2015-12-11 ENCOUNTER — Telehealth: Payer: Self-pay | Admitting: Nurse Practitioner

## 2015-12-11 LAB — LAMOTRIGINE LEVEL: Lamotrigine Lvl: 29.8 ug/mL (ref 2.0–20.0)

## 2015-12-11 LAB — VALPROIC ACID LEVEL: VALPROIC ACID LVL: 44 ug/mL — AB (ref 50–100)

## 2015-12-11 LAB — PHENOBARBITAL LEVEL: Phenobarbital, Serum: 37 ug/mL (ref 15–40)

## 2015-12-11 NOTE — Telephone Encounter (Signed)
Telephone call to daughter. Made her aware of high Lamictal level will hold dose tonight and I will talk to Dr. Krista Blue first thing in the morning. She is agreeable to that

## 2015-12-11 NOTE — Telephone Encounter (Signed)
Called  Daughter, Verdene Lennert made her aware of phenobarbital and  Depakote levels. Continue medication titration as discussed yesterday

## 2015-12-11 NOTE — Progress Notes (Signed)
I have reviewed and agreed above plan. 

## 2015-12-12 NOTE — Telephone Encounter (Signed)
TC to daughter had to leave a voice message.

## 2015-12-12 NOTE — Telephone Encounter (Signed)
Telephone call to daughter Verdene Lennert. Patient is still having some dizziness and difficulty with her ambulation however it is better than several days ago on office visit. Made her aware I discussed her high Lamictal level with Dr. Krista Blue and she can decrease the morning dose of Lamictal to 1 tablet but  continue 2 tablets at bedtime. Our priority is to get her off phenobarbital altogether. She is to decrease to a lower dose tomorrow of the 16.2 mg 2 tabs at night for 2 weeks then decrease to 1 tablet at night for 2 weeks then discontinue. Daughter verbalizes understanding. I asked the daughter to call me back on Friday and let me know how her mom is doing

## 2015-12-16 ENCOUNTER — Encounter: Payer: Self-pay | Admitting: Nurse Practitioner

## 2015-12-16 ENCOUNTER — Encounter: Payer: Self-pay | Admitting: Neurology

## 2016-01-03 ENCOUNTER — Other Ambulatory Visit: Payer: Self-pay | Admitting: *Deleted

## 2016-01-03 MED ORDER — LAMOTRIGINE 200 MG PO TABS: ORAL_TABLET | ORAL | 3 refills | Status: DC

## 2016-01-10 ENCOUNTER — Encounter: Payer: Self-pay | Admitting: Nurse Practitioner

## 2016-01-10 ENCOUNTER — Ambulatory Visit (INDEPENDENT_AMBULATORY_CARE_PROVIDER_SITE_OTHER): Admitting: Nurse Practitioner

## 2016-01-10 VITALS — BP 116/61 | HR 71 | Ht 64.0 in | Wt 193.6 lb

## 2016-01-10 DIAGNOSIS — G40209 Localization-related (focal) (partial) symptomatic epilepsy and epileptic syndromes with complex partial seizures, not intractable, without status epilepticus: Secondary | ICD-10-CM | POA: Diagnosis not present

## 2016-01-10 DIAGNOSIS — Z5181 Encounter for therapeutic drug level monitoring: Secondary | ICD-10-CM

## 2016-01-10 NOTE — Progress Notes (Signed)
I have read the note, and I agree with the clinical assessment and plan.  Tammy Rodgers KEITH   
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
## 2016-01-10 NOTE — Patient Instructions (Signed)
Continue Lamictal 200 mg in the morning and 400 at night Continue Depakote 500mg  2 tabs daily Continue to titrate phenobarbital Follow-up in 3 months

## 2016-01-16 LAB — LAMOTRIGINE LEVEL: Lamotrigine Lvl: 22.8 ug/mL (ref 2.0–20.0)

## 2016-01-16 LAB — VALPROIC ACID LEVEL: Valproic Acid Lvl: 71 ug/mL (ref 50–100)

## 2016-01-17 ENCOUNTER — Telehealth: Payer: Self-pay | Admitting: *Deleted

## 2016-01-17 NOTE — Telephone Encounter (Signed)
Attempted to reach patient re: lab results. Phone was answered but no one spoke; call was ended. Will try to reach later.

## 2016-01-18 NOTE — Telephone Encounter (Signed)
Per Daun Peacock, NP, LVM informing patient that Hoyle Sauer discussed her labs with Dr. Krista Blue. Hoyle Sauer advises she continue with the  same dose of divalproex and lamictal, and call for seizure activity. Informed her the office is now closed, but there is a dr on call for the weekend. Gave her office number.

## 2016-01-21 ENCOUNTER — Telehealth: Payer: Self-pay | Admitting: Nurse Practitioner

## 2016-01-21 NOTE — Telephone Encounter (Signed)
I called and spoke to the daughter. Her mom was seen on 1227/17  and was walking fine that day and phenobarbital last dose was 01/11/16. Lamotrigine level on 01/10/16  was 22.8 and valproic acid was 71. Discussed with Dr. Krista Blue will cut back on Depakote to 500 at night. Daughter made aware and asked to call me within the next week to let me know how this has affected her mom. Mom is back to baseline. She is agreeable

## 2016-01-21 NOTE — Addendum Note (Signed)
Addended by: Evlyn Courier on: 01/21/2016 01:20 PM   Modules accepted: Orders

## 2016-01-21 NOTE — Telephone Encounter (Signed)
I called and spoke to daughter, veronica.  She is with pt now.  Husband went and got pt.  (she is a Pharmacist, hospital).  [place] prior to getting into the school.  Has a busted lip.  Husband took, Bp and she has had blood sugar done this am, per daughter ok.  She states that pt stopped phemobarb12/29/2017 and taking lamictal and depakote as ordered.  She continues to have shakiness all the time, transient disorientation and gait issues (walking like she is drunk).  These sx are from before thanksgiving.  Wondering if having toxice drug levels?  Please advise.

## 2016-01-21 NOTE — Telephone Encounter (Signed)
Patient's daughter is calling stating the patient just fell at work. Since she has been on divalproex (DEPAKOTE ER) 500 MG 24 hr tablet she has been shaking, forgetting, clumsy, disoriented and has fallen multiple times.. Please call and discuss.

## 2016-01-23 ENCOUNTER — Encounter: Payer: Self-pay | Admitting: *Deleted

## 2016-01-23 NOTE — Telephone Encounter (Signed)
I spoke to pt .  She got up normally from bed. Took her shower, ate breakfast and then after this, the offbalanceness come on.  She is like she is drunk, weaves.  Husband placed in chair with phone and close to BR.  Neighbors available for her.  Husband left to go to work.  She is not having any other sx other she stated a headache.  No numbness, tingling, weakness, slurred speech, vision problems.  Taking depakote 500mg  po qhs as noted from last phone call with CM/NP. Please advise.   Spoke to pt at her home #.

## 2016-01-23 NOTE — Telephone Encounter (Signed)
Per vo by Dr. Krista Blue, ok to stop VPA and continue Lamictal.

## 2016-01-23 NOTE — Telephone Encounter (Signed)
Returned call - spoke to her daughter, Verdene Lennert.  She will stop have the patient stop the VPA and continue Lamictal her current dose.  She verbalized understanding and will call back with any concerns.

## 2016-01-23 NOTE — Telephone Encounter (Signed)
Selinda Eon I talked to Dr. Krista Blue about Tammy Rodgers 2 days ago. We cut back on VPA. She is still having balance issues and did not go to work today. Please ask Dr. Krista Blue if I need to stop VPA altogether Thanks

## 2016-01-23 NOTE — Telephone Encounter (Signed)
Pt called said she is not steady on her feet today. She stayed home out of work today. She says she is not any better since Monday. Pt expressed it was an "emergency", she did not have any confusion, no stumbling of words or numbness, tingling.  Please call asap

## 2016-01-24 NOTE — Telephone Encounter (Signed)
Patient is calling stating she is dizzy today and wants a returned call.

## 2016-01-24 NOTE — Telephone Encounter (Signed)
Returned call to patient - she has spoken to her daughter and she has discontinued her evening dose of VPA, in hopes it will improve her dizziness.  She will call back, if her symptoms do not improve.

## 2016-01-24 NOTE — Telephone Encounter (Signed)
Pt called said to please disregard msg she sent today. Her daughter advised her to wait until the labs are in to discuss any issues.  FYI

## 2016-03-05 ENCOUNTER — Ambulatory Visit: Admitting: Neurology

## 2016-04-10 ENCOUNTER — Ambulatory Visit: Admitting: Nurse Practitioner

## 2016-04-17 ENCOUNTER — Encounter: Payer: Self-pay | Admitting: Nurse Practitioner

## 2016-04-17 ENCOUNTER — Ambulatory Visit (INDEPENDENT_AMBULATORY_CARE_PROVIDER_SITE_OTHER): Admitting: Nurse Practitioner

## 2016-04-17 VITALS — BP 112/62 | HR 77 | Wt 187.2 lb

## 2016-04-17 DIAGNOSIS — Z5181 Encounter for therapeutic drug level monitoring: Secondary | ICD-10-CM | POA: Insufficient documentation

## 2016-04-17 DIAGNOSIS — G40209 Localization-related (focal) (partial) symptomatic epilepsy and epileptic syndromes with complex partial seizures, not intractable, without status epilepticus: Secondary | ICD-10-CM
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
## 2016-04-17 NOTE — Progress Notes (Signed)
I have reviewed and agreed above plan. 

## 2016-04-17 NOTE — Patient Instructions (Signed)
Continue Lamictal 200 mg in the morning and 400 at night Will check level of drug To make sure patient is therapeutic Patient discontinued Depakote due to dizziness  She is now off phenobarbital completely  Follow-up in 6 months

## 2016-04-21 ENCOUNTER — Telehealth: Payer: Self-pay | Admitting: *Deleted

## 2016-04-21 LAB — LAMOTRIGINE LEVEL: Lamotrigine Lvl: 23.7 ug/mL (ref 2.0–20.0)

## 2016-04-21 NOTE — Telephone Encounter (Signed)
Per Daun Peacock, NP, LVM informing patient her lamictal level is a little high, but Hoyle Sauer will leave her medication dose the same. Repeated the instructions and left number for any questions.

## 2016-06-27 ENCOUNTER — Ambulatory Visit (INDEPENDENT_AMBULATORY_CARE_PROVIDER_SITE_OTHER): Admitting: Internal Medicine

## 2016-06-27 DIAGNOSIS — Z789 Other specified health status: Secondary | ICD-10-CM | POA: Diagnosis not present

## 2016-06-27 DIAGNOSIS — Z9189 Other specified personal risk factors, not elsewhere classified: Secondary | ICD-10-CM | POA: Diagnosis not present

## 2016-06-27 DIAGNOSIS — Z7189 Other specified counseling: Secondary | ICD-10-CM

## 2016-06-27 DIAGNOSIS — Z23 Encounter for immunization: Secondary | ICD-10-CM | POA: Diagnosis not present

## 2016-06-27 DIAGNOSIS — Z7184 Encounter for health counseling related to travel: Secondary | ICD-10-CM

## 2016-06-27 MED ORDER — AZITHROMYCIN 500 MG PO TABS: 500.0000 mg | ORAL_TABLET | Freq: Every day | ORAL | 0 refills | Status: DC

## 2016-06-27 MED ORDER — ATOVAQUONE-PROGUANIL HCL 250-100 MG PO TABS: 1.0000 | ORAL_TABLET | Freq: Every day | ORAL | 0 refills | Status: DC

## 2016-06-27 NOTE — Progress Notes (Signed)
Subjective:   SOLARA GOODCHILD is a 56 y.o. female who presents to the Infectious Disease clinic for travel consultation. Planned departure date: end of september      Planned return date: 7 days later Countries of travel: [place] Areas in country: urban mainly - to [place], [place], and [place] Accommodations: hotel Purpose of travel: vacation Prior travel out of [place]: yes, [place] in 1995     Objective:   Medications:lamictal, eforge, janumet, vytorin, dulexotine, amlodipine All: iodine, and ritalin  Pmhx: seizure, htn, dm    Assessment:   No contraindications to travel. none   Plan:   Pre travel counseling provided  Mosquito borne illness = gave precautions. Unsure if her itinerary will go to malaria area. Will give rx for malarone if needed  Pre travel vaccine = hep a and typhoid. Will recommend to also get tdap through pcp  Traveler's diarrhea =will give rx for azithromycin to use if needed. Gave precautions

## 2016-06-27 NOTE — Patient Instructions (Signed)
[facility]                [address], [place]                   [place], [place] [address]                      Phone: [phone]                        Fax: [phone]   Planned departure date: end of sept   Planned return date: 7 days Countries of travel: [place]  Guidelines for the Prevention & Treatment of Traveler's Diarrhea  Prevention: "Boil it, Peel it, Lacinda Axon it, or Forget it"   the fewer chances -> lower risk: try to stick to food & water precautions as much as possible"   If it's "piping hot"; it is probably okay, if not, it may not be   Treatment   1) You should always take care to drink lots of fluids in order to avoid dehydration   2) You should bring medications with you in case you come down with a case of diarrhea   3) OTC = bring pepto-bismol - can take with initial abdominal symptoms;                    Imodium - can help slow down your intestinal tract, can help relief cramps                    and diarrhea, can take if no bloody diarrhea  Use azithromycin if needed for traveler's diarrhea  Guidelines for the Prevention of Malaria  Avoidance:  -fewer mosquito bites = lower risk. Mosquitos can bite at night as well as daytime  -cover up (long sleeve clothing), mosquito nets, screens  -Insect repellent for your skin ( DEET containing lotion > 20%): for clothes ( permethrin spray)   If you go to safari in malaria area (look at map enclosed), start malarone 2 days prior to going to the malaria area, take daily until 7days after you are out of the area.  for malaria prevention.   Immunizations received today: hep A and injectable typhoid Future immunizations, if indicated - 2nd hepatitis A in 6 months for lifetime protectoin  Prior to travel:  1) Be sure to pick up appropriate prescriptions, including medicine you take daily. Do not expect to be able to fill your prescriptions abroad.  2) Strongly  consider obtaining traveler's insurance, including emergency evacuation insurance. Most plans in the [place] do not cover participants abroad. (see below for resources)  3) Register at the appropriate [place] embassy or consulate with travel dates so they are aware of your presence in-country and for helpful advice during travel using the [employer] (STEP, GreenNylon.com.cy).  4) Leave contact information with a relative or friend.  5) Keep a Research officer, political party, credit cards in case they become lost or stolen  6) Inform your credit card company that you will be travelling abroad   During travel:  1) If you become ill and need medical advice, the [place] [employer]	[place] of the country you are traveling in general provides a list of [place] speaking doctors.  We are also available on MyChart for remote consultation if you register prior to travel. 2) Avoid motorcycles or scooters when at all possible.  Traffic laws in many countries are lax and accidents occur frequently.  3) Do not take any unnecessary risks that you wouldn't do at home.   Resources:  -Country specific information: BlindResource.ca or GreenNylon.com.cy  -Press photographer (DEET, mosquito nets): [employer], [employer] store, [employer], [place] insurance options: gatewayplans.com; http://clayton-rivera.info/; travelguard.com or [employer], gninsurance.com or info@gninsurance .com, [id number].   Post Travel:  If you return from your trip ill, call your primary care doctor or our travel clinic @ [phone].   Enjoy your trip and know that with proper pre-travel preparation, most people have an enjoyable and uninterrupted trip!

## 2016-07-15 ENCOUNTER — Ambulatory Visit

## 2016-07-22 ENCOUNTER — Ambulatory Visit

## 2016-07-29 ENCOUNTER — Ambulatory Visit

## 2016-10-17 ENCOUNTER — Ambulatory Visit: Admitting: Nurse Practitioner

## 2016-10-21 ENCOUNTER — Ambulatory Visit: Admitting: Nurse Practitioner

## 2017-01-05 ENCOUNTER — Telehealth: Payer: Self-pay | Admitting: Neurology

## 2017-01-05 MED ORDER — LAMOTRIGINE 200 MG PO TABS: ORAL_TABLET | ORAL | 3 refills | Status: DC

## 2017-01-05 NOTE — Telephone Encounter (Signed)
I called the patient.  The patient needs a prescription for her Lamictal, she is on brand name medication.  Apparently this medication needs a prior authorization, there is no one in our office today, this will need to be done on 07 January 2017 when the office reopens.  I will send in the prescription for the Lamictal now to [facility].

## 2017-01-06 ENCOUNTER — Other Ambulatory Visit: Payer: Self-pay | Admitting: Neurology

## 2017-01-28 ENCOUNTER — Ambulatory Visit (INDEPENDENT_AMBULATORY_CARE_PROVIDER_SITE_OTHER): Admitting: Neurology

## 2017-01-28 ENCOUNTER — Encounter: Payer: Self-pay | Admitting: Neurology

## 2017-01-28 VITALS — BP 114/63 | HR 87 | Ht 64.0 in | Wt 178.0 lb

## 2017-01-28 DIAGNOSIS — R202 Paresthesia of skin: Secondary | ICD-10-CM | POA: Diagnosis not present

## 2017-01-28 DIAGNOSIS — G40209 Localization-related (focal) (partial) symptomatic epilepsy and epileptic syndromes with complex partial seizures, not intractable, without status epilepticus: Secondary | ICD-10-CM | POA: Diagnosis not present

## 2017-01-28 MED ORDER — DULOXETINE HCL 60 MG PO CPEP: 60.0000 mg | ORAL_CAPSULE | Freq: Every day | ORAL | 4 refills | Status: DC
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
## 2017-02-20 ENCOUNTER — Encounter (INDEPENDENT_AMBULATORY_CARE_PROVIDER_SITE_OTHER)

## 2017-02-20 ENCOUNTER — Ambulatory Visit (INDEPENDENT_AMBULATORY_CARE_PROVIDER_SITE_OTHER): Admitting: Neurology

## 2017-02-20 ENCOUNTER — Ambulatory Visit
Admission: RE | Admit: 2017-02-20 | Discharge: 2017-02-20 | Disposition: A | Discharge status: Home / Self Care | Source: Ambulatory Visit | Attending: Neurology | Admitting: Neurology

## 2017-02-20 DIAGNOSIS — R202 Paresthesia of skin: Secondary | ICD-10-CM | POA: Diagnosis not present

## 2017-02-20 DIAGNOSIS — G40209 Localization-related (focal) (partial) symptomatic epilepsy and epileptic syndromes with complex partial seizures, not intractable, without status epilepticus: Secondary | ICD-10-CM

## 2017-02-20 DIAGNOSIS — Z0289 Encounter for other administrative examinations: Secondary | ICD-10-CM

## 2017-02-20 NOTE — Procedures (Signed)
Full Name: Tammy Rodgers Gender: Female MRN #: [id number] Date of Birth: Feb 11, 2060    Visit Date: 02/20/17 08:22 Age: 57 Years 2 Months Old Examining Physician: Marcial Pacas, MD  Referring Physician: Krista Blue, MD History: 57 year old female, complains of bilateral hand and feet paresthesia  Summary of the tests: Nerve conduction study: Bilateral sural, superficial peroneal sensory responses were normal.  Bilateral peroneal to EDB, tibial motor responses were normal.  Bilateral ulnar sensory responses were normal.  Right ulnar motor responses were normal.  Bilateral medial motor responses were normal.  Bilateral medial sensory responses showed mildly prolonged peak latency with mildly decreased snap amplitude.  Electromyography: Selective needle examinations of right upper, lower extremity muscles; right cervical; right lumbosacral paraspinal muscles showed no significant abnormality.   Conclusion: This is a mild abnormal study.  There is electrodiagnostic evidence of bilateral median neuropathy across the wrist, consistent with mild bilateral carpal tunnel syndromes.  There is no evidence of large fiber peripheral neuropathy, right cervical or lumbar sacral radiculopathy.    ------------------------------- Marcial Pacas, M.D.  [facility] [place], [place] [address] Tel: [phone] Fax: [phone]        [facility]    Nerve / Sites Muscle Latency Ref. Amplitude Ref. Rel Amp Segments Distance Velocity Ref. Area    ms ms mV mV %  cm m/s m/s mVms  R Median - APB     Wrist APB 4.1 ?4.4 7.0 ?4.0 100 Wrist - APB 7   18.7     Upper arm APB 8.3  6.0  85.7 Upper arm - Wrist 21 50 ?49 21.2  L Median - APB     Wrist APB 4.1 ?4.4 5.0 ?4.0 100 Wrist - APB 7   9.0     Upper arm APB 8.3  5.5  111 Upper arm - Wrist 21 50 ?49 12.1  R Ulnar - ADM     Wrist ADM 2.6 ?3.3 7.4 ?6.0 100 Wrist - ADM 7   21.4     B.Elbow ADM 6.4  6.6  89.7 B.Elbow - Wrist 20 52 ?49  20.6     A.Elbow ADM 8.2  6.5  98.8 A.Elbow - B.Elbow 10 56 ?49 20.1         A.Elbow - Wrist      R Peroneal - EDB     Ankle EDB 4.0 ?6.5 5.2 ?2.0 100 Ankle - EDB 9   13.8     Fib head EDB 10.4  5.3  103 Fib head - Ankle 32 50 ?44 15.4     Pop fossa EDB 12.3  5.2  97.4 Pop fossa - Fib head 10 52 ?44 16.4         Pop fossa - Ankle      L Peroneal - EDB     Ankle EDB 3.7 ?6.5 5.2 ?2.0 100 Ankle - EDB 9   14.9     Fib head EDB 10.3  4.9  93 Fib head - Ankle 32 49 ?44 13.9     Pop fossa EDB 12.2  4.9  102 Pop fossa - Fib head 10 51 ?44 14.3         Pop fossa - Ankle      R Tibial - AH     Ankle AH 4.2 ?5.8 14.0 ?4.0 100 Ankle - AH 9   24.2     Pop fossa AH 11.7  8.2  58.9 Pop fossa - Ankle 36  48 ?41 19.8  L Tibial - AH     Ankle AH 4.0 ?5.8 11.8 ?4.0 100 Ankle - AH 9   24.0     Pop fossa AH 12.1  5.9  49.5 Pop fossa - Ankle 36 45 ?41 18.6                     SNC    Nerve / Sites Rec. Site Peak Lat Ref.  Amp Ref. Segments Distance    ms ms V V  cm  R Sural - Ankle (Calf)     Calf Ankle 3.4 ?4.4 10 ?6 Calf - Ankle 14  L Sural - Ankle (Calf)     Calf Ankle 3.5 ?4.4 8 ?6 Calf - Ankle 14  L Superficial peroneal - Ankle     Lat leg Ankle 3.7 ?4.4 12 ?6 Lat leg - Ankle 14  R Superficial peroneal - Ankle     Lat leg Ankle 3.9 ?4.4 11 ?6 Lat leg - Ankle 14  R Median - Orthodromic (Dig II, Mid palm)     Dig II Wrist 4.2 ?3.4 7 ?10 Dig II - Wrist 13  L Median - Orthodromic (Dig II, Mid palm)     Dig II Wrist 4.0 ?3.4 4 ?10 Dig II - Wrist 13  R Ulnar - Orthodromic, (Dig V, Mid palm)     Dig V Wrist 2.7 ?3.1 5 ?5 Dig V - Wrist 11  L Ulnar - Orthodromic, (Dig V, Mid palm)     Dig V Wrist 2.4 ?3.1 6 ?5 Dig V - Wrist 1                     F  Wave    Nerve F Lat Ref.   ms ms  R Tibial - AH 49.9 ?56.0  L Tibial - AH 50.7 ?56.0  R Ulnar - ADM 28.6 ?32.0           EMG full       EMG Summary Table    Spontaneous MUAP Recruitment  Muscle IA Fib PSW Fasc Other Amp Dur. Poly Pattern    R. Tibialis anterior Normal None None None _______ Normal Normal Normal Normal  R. Tibialis posterior Normal None None None _______ Normal Normal Normal Normal  R. Gastrocnemius (Medial head) Normal None None None _______ Normal Normal Normal Normal  R. Peroneus longus Normal None None None _______ Normal Normal Normal Normal  R. Vastus lateralis Normal None None None _______ Normal Normal Normal Normal  R. First dorsal interosseous Normal None None None _______ Normal Normal Normal Normal  R. Abductor pollicis brevis Normal None None None _______ Normal Normal Normal Normal  R. Pronator teres Normal None None None _______ Normal Normal Normal Normal  R. Triceps brachii Normal None None None _______ Normal Normal Normal Normal  R. Deltoid Normal None None None _______ Normal Normal Normal Normal  R. Biceps brachii Normal None None None _______ Normal Normal Normal Normal  R. Biceps femoris (long head) Normal None None None _______ Normal Normal Normal Normal  R. Lumbar paraspinals (mid) Normal None None None _______ Normal Normal Normal Normal  R. Lumbar paraspinals (low) Normal None None None _______ Normal Normal Normal Normal  R. Cervical paraspinals Normal None None None _______ Normal Normal Normal Normal

## 2017-12-31 ENCOUNTER — Other Ambulatory Visit: Payer: Self-pay | Admitting: Neurology

## 2018-04-04 ENCOUNTER — Other Ambulatory Visit: Payer: Self-pay | Admitting: Neurology

## 2018-04-28 ENCOUNTER — Other Ambulatory Visit: Payer: Self-pay | Admitting: *Deleted

## 2018-04-28 ENCOUNTER — Other Ambulatory Visit: Payer: Self-pay

## 2018-04-28 ENCOUNTER — Encounter: Payer: Self-pay | Admitting: *Deleted

## 2018-04-28 ENCOUNTER — Encounter: Payer: Self-pay | Admitting: Neurology

## 2018-04-28 ENCOUNTER — Ambulatory Visit (INDEPENDENT_AMBULATORY_CARE_PROVIDER_SITE_OTHER): Admitting: Neurology

## 2018-04-28 DIAGNOSIS — G40209 Localization-related (focal) (partial) symptomatic epilepsy and epileptic syndromes with complex partial seizures, not intractable, without status epilepticus: Secondary | ICD-10-CM | POA: Diagnosis not present

## 2018-04-28 DIAGNOSIS — G5603 Carpal tunnel syndrome, bilateral upper limbs: Secondary | ICD-10-CM | POA: Diagnosis not present

## 2018-04-28 MED ORDER — LAMICTAL 200 MG PO TABS: ORAL_TABLET | ORAL | 0 refills | Status: DC

## 2018-04-28 MED ORDER — LAMICTAL 200 MG PO TABS: ORAL_TABLET | ORAL | 3 refills | Status: DC

## 2018-04-28 MED ORDER — LAMOTRIGINE 200 MG PO TABS: ORAL_TABLET | ORAL | 0 refills | Status: DC
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
## 2018-05-26 ENCOUNTER — Telehealth: Payer: Self-pay

## 2018-05-26 NOTE — Telephone Encounter (Signed)
Unable to get in contact with the patient to schedule her yearly follow up with Sarah per Dr. Krista Blue. I left a voicemail asking her to return my call. Office number was provided.  If patient calls schedule a yearly follow up with Judson Roch.

## 2018-06-03 NOTE — Telephone Encounter (Signed)
Done

## 2018-07-04 ENCOUNTER — Other Ambulatory Visit: Payer: Self-pay | Admitting: Neurology

## 2019-02-21 ENCOUNTER — Telehealth: Payer: Self-pay | Admitting: Neurology

## 2019-02-21 NOTE — Telephone Encounter (Signed)
Patient called in and stated she has began having numbness in her left leg and foot again and wants to know if she should come in for a revisit before her scheduled date. Please advise

## 2019-02-21 NOTE — Telephone Encounter (Signed)
Tammy Rodgers had an opening become available on 2/9/ at 2:45pm. The patient accepted the appt and will arrived for check-in at 2:15pm.

## 2019-02-22 ENCOUNTER — Other Ambulatory Visit: Payer: Self-pay

## 2019-02-22 ENCOUNTER — Encounter: Payer: Self-pay | Admitting: Neurology

## 2019-02-22 ENCOUNTER — Ambulatory Visit (INDEPENDENT_AMBULATORY_CARE_PROVIDER_SITE_OTHER): Admitting: Neurology

## 2019-02-22 VITALS — BP 116/65 | HR 72 | Temp 97.7°F | Ht 63.0 in | Wt 169.2 lb

## 2019-02-22 DIAGNOSIS — R202 Paresthesia of skin: Secondary | ICD-10-CM | POA: Diagnosis not present

## 2019-02-22 DIAGNOSIS — G40209 Localization-related (focal) (partial) symptomatic epilepsy and epileptic syndromes with complex partial seizures, not intractable, without status epilepticus: Secondary | ICD-10-CM | POA: Diagnosis not present

## 2019-02-22 NOTE — Patient Instructions (Signed)
Let's check MRI of the brain, will get Lamictal level today. See you back in 3-4 months
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
## 2019-02-23 LAB — LAMOTRIGINE LEVEL: Lamotrigine Lvl: 22.4 ug/mL (ref 2.0–20.0)

## 2019-02-24 ENCOUNTER — Telehealth: Payer: Self-pay | Admitting: *Deleted

## 2019-02-24 NOTE — Telephone Encounter (Signed)
-----   Message from Suzzanne Cloud, NP sent at 02/23/2019  8:15 PM EST ----- Please call the patient. Lamictal level was slightly high 22.4. It has been elevated prior. Make sure no signs of toxicity, none were reported in exam. I will see her back in May and can recheck. If she wants we can get a trough level to recheck now that is okay too.

## 2019-02-24 NOTE — Telephone Encounter (Signed)
lmcm for pt to return call.

## 2019-02-25 NOTE — Telephone Encounter (Signed)
Pt has called Lovey Newcomer, RN back.  Pt asking for a call back on Monday

## 2019-02-28 ENCOUNTER — Other Ambulatory Visit: Payer: Self-pay | Admitting: *Deleted

## 2019-02-28 DIAGNOSIS — G40209 Localization-related (focal) (partial) symptomatic epilepsy and epileptic syndromes with complex partial seizures, not intractable, without status epilepticus: Secondary | ICD-10-CM

## 2019-02-28 NOTE — Telephone Encounter (Signed)
Spoke to pt and relayed lab results.  She will come back 03-09-19 and get trough level.  I placed order.  She will call GI for scheduling MRI.

## 2019-02-28 NOTE — Telephone Encounter (Signed)
-----   Message from Suzzanne Cloud, NP sent at 02/23/2019  8:15 PM EST ----- Please call the patient. Lamictal level was slightly high 22.4. It has been elevated prior. Make sure no signs of toxicity, none were reported in exam. I will see her back in May and can recheck. If she wants we can get a trough level to recheck now that is okay too.

## 2019-02-28 NOTE — Telephone Encounter (Signed)
I spoke to pt and relayed the results of her labs.  She stated that she will come in and get lamictal level 03-09-19 (trough level).  I also gave her # to GI for scheduling her MRI.  LO:[id number].  She appreciated call.

## 2019-03-16 NOTE — Progress Notes (Signed)
I have reviewed and agreed above plan. 

## 2019-03-23 ENCOUNTER — Ambulatory Visit
Admission: RE | Admit: 2019-03-23 | Discharge: 2019-03-23 | Disposition: A | Discharge status: Home / Self Care | Source: Ambulatory Visit | Attending: Neurology | Admitting: Neurology

## 2019-03-23 DIAGNOSIS — R202 Paresthesia of skin: Secondary | ICD-10-CM

## 2019-03-28 ENCOUNTER — Telehealth: Payer: Self-pay | Admitting: *Deleted

## 2019-03-28 NOTE — Telephone Encounter (Signed)
LMVM for pt to return call for MRI results.  

## 2019-03-28 NOTE — Telephone Encounter (Signed)
-----   Message from Suzzanne Cloud, NP sent at 03/28/2019 12:42 PM EDT ----- Please call patient. Dr. Krista Blue reviewed this MRI from previous, no significant change. If episodes continue please let us know, she is at risk for TIA with history of lacunar infarct.   MRI brain (without) demonstrating: - Mild periventricular and subcortical chronic small vessel ischemic disease. Right frontal subcortical ischemic infarction. Right parietal chronic cerebral microhemorrhage.  - No acute findings.

## 2019-03-29 ENCOUNTER — Telehealth: Payer: Self-pay | Admitting: *Deleted

## 2019-03-29 NOTE — Telephone Encounter (Signed)
I spoke to pt and she has questions relating to her previous MRI results.

## 2019-03-29 NOTE — Telephone Encounter (Signed)
-----   Message from Suzzanne Cloud, NP sent at 03/28/2019 12:42 PM EDT ----- Please call patient. Dr. Krista Blue reviewed this MRI from previous, no significant change. If episodes continue please let us know, she is at risk for TIA with history of lacunar infarct.   MRI brain (without) demonstrating: - Mild periventricular and subcortical chronic small vessel ischemic disease. Right frontal subcortical ischemic infarction. Right parietal chronic cerebral microhemorrhage.  - No acute findings.

## 2019-03-29 NOTE — Telephone Encounter (Signed)
Pt has called Sandy,RN back, she is a Education officer, [employer]. Please call anytime after 4:30

## 2019-03-29 NOTE — Telephone Encounter (Signed)
I called pt and relayed her MRI results.  NO change from previous.  I relayed that she is at risk for TIA with history of lacunar infart..  She stated she was not aware of this, never told.  Please call her back.  Thanks. (I see last MRI 2017 and notes).

## 2019-03-30 NOTE — Telephone Encounter (Signed)
I tried to call the patient, no answer, left a message. She should be taking aspirin 81 mg daily if no contraindication for history of previous stroke.

## 2019-03-31 NOTE — Telephone Encounter (Signed)
I called the patient.  We reviewed MRI of the brain.  She will start taking aspirin 81 mg daily.  I will see her for follow-up in May.  MRI of the brain showed no significant change from 2017.  We discussed the importance of managing vascular risk factors with PCP.

## 2019-04-28 ENCOUNTER — Ambulatory Visit: Payer: Self-pay | Admitting: Neurology

## 2019-05-07 ENCOUNTER — Emergency Department (HOSPITAL_COMMUNITY)

## 2019-05-07 ENCOUNTER — Emergency Department (HOSPITAL_COMMUNITY)
Admission: EM | Admit: 2019-05-07 | Discharge: 2019-05-07 | Disposition: A | Discharge status: Home / Self Care | Attending: Emergency Medicine | Admitting: Emergency Medicine

## 2019-05-07 ENCOUNTER — Other Ambulatory Visit: Payer: Self-pay

## 2019-05-07 DIAGNOSIS — R251 Tremor, unspecified: Secondary | ICD-10-CM | POA: Diagnosis not present

## 2019-05-07 DIAGNOSIS — R4781 Slurred speech: Secondary | ICD-10-CM

## 2019-05-07 DIAGNOSIS — I10 Essential (primary) hypertension: Secondary | ICD-10-CM | POA: Diagnosis not present

## 2019-05-07 DIAGNOSIS — R791 Abnormal coagulation profile: Secondary | ICD-10-CM | POA: Insufficient documentation

## 2019-05-07 DIAGNOSIS — R410 Disorientation, unspecified: Secondary | ICD-10-CM | POA: Insufficient documentation

## 2019-05-07 DIAGNOSIS — Z79899 Other long term (current) drug therapy: Secondary | ICD-10-CM | POA: Diagnosis not present

## 2019-05-07 LAB — I-STAT CHEM 8, ED
BUN: 13 mg/dL (ref 6–20)
Calcium, Ion: 1.17 mmol/L (ref 1.15–1.40)
Chloride: 102 mmol/L (ref 98–111)
Creatinine, Ser: 0.7 mg/dL (ref 0.44–1.00)
Glucose, Bld: 82 mg/dL (ref 70–99)
HCT: 40 % (ref 36.0–46.0)
Hemoglobin: 13.6 g/dL (ref 12.0–15.0)
Potassium: 3.1 mmol/L — ABNORMAL LOW (ref 3.5–5.1)
Sodium: 141 mmol/L (ref 135–145)
TCO2: 27 mmol/L (ref 22–32)

## 2019-05-07 LAB — DIFFERENTIAL
Abs Immature Granulocytes: 0.01 10*3/uL (ref 0.00–0.07)
Basophils Absolute: 0 10*3/uL (ref 0.0–0.1)
Basophils Relative: 1 %
Eosinophils Absolute: 0.1 10*3/uL (ref 0.0–0.5)
Eosinophils Relative: 2 %
Immature Granulocytes: 0 %
Lymphocytes Relative: 54 %
Lymphs Abs: 2.9 10*3/uL (ref 0.7–4.0)
Monocytes Absolute: 0.6 10*3/uL (ref 0.1–1.0)
Monocytes Relative: 12 %
Neutro Abs: 1.6 10*3/uL — ABNORMAL LOW (ref 1.7–7.7)
Neutrophils Relative %: 31 %

## 2019-05-07 LAB — COMPREHENSIVE METABOLIC PANEL
ALT: 29 U/L (ref 0–44)
AST: 31 U/L (ref 15–41)
Albumin: 5.1 g/dL — ABNORMAL HIGH (ref 3.5–5.0)
Alkaline Phosphatase: 76 U/L (ref 38–126)
Anion gap: 14 (ref 5–15)
BUN: 12 mg/dL (ref 6–20)
CO2: 25 mmol/L (ref 22–32)
Calcium: 11 mg/dL — ABNORMAL HIGH (ref 8.9–10.3)
Chloride: 102 mmol/L (ref 98–111)
Creatinine, Ser: 0.7 mg/dL (ref 0.44–1.00)
GFR calc Af Amer: >60 mL/min (ref >60)
GFR calc non Af Amer: >60 mL/min (ref >60)
Glucose, Bld: 92 mg/dL (ref 70–99)
Potassium: 3.4 mmol/L — ABNORMAL LOW (ref 3.5–5.1)
Sodium: 141 mmol/L (ref 135–145)
Total Bilirubin: 0.5 mg/dL (ref 0.3–1.2)
Total Protein: 8.4 g/dL — ABNORMAL HIGH (ref 6.5–8.1)

## 2019-05-07 LAB — CBC
HCT: 40 % (ref 36.0–46.0)
Hemoglobin: 12.8 g/dL (ref 12.0–15.0)
MCH: 29.5 pg (ref 26.0–34.0)
MCHC: 32 g/dL (ref 30.0–36.0)
MCV: 92.2 fL (ref 80.0–100.0)
Platelets: 294 10*3/uL (ref 150–400)
RBC: 4.34 MIL/uL (ref 3.87–5.11)
RDW: 13.7 % (ref 11.5–15.5)
WBC: 5.3 10*3/uL (ref 4.0–10.5)
nRBC: 0 % (ref 0.0–0.2)

## 2019-05-07 LAB — URINALYSIS, ROUTINE W REFLEX MICROSCOPIC
Bilirubin Urine: NEGATIVE
Glucose, UA: NEGATIVE mg/dL
Hgb urine dipstick: NEGATIVE
Ketones, ur: NEGATIVE mg/dL
Leukocytes,Ua: NEGATIVE
Nitrite: NEGATIVE
Protein, ur: NEGATIVE mg/dL
Specific Gravity, Urine: 1.011 (ref 1.005–1.030)
pH: 9 — ABNORMAL HIGH (ref 5.0–8.0)

## 2019-05-07 LAB — CBG MONITORING, ED: Glucose-Capillary: 91 mg/dL (ref 70–99)

## 2019-05-07 LAB — I-STAT BETA HCG BLOOD, ED (MC, WL, AP ONLY): I-stat hCG, quantitative: <5 m[IU]/mL (ref <5)

## 2019-05-07 LAB — PROTIME-INR
INR: 0.9 (ref 0.8–1.2)
Prothrombin Time: 12.4 seconds (ref 11.4–15.2)

## 2019-05-07 LAB — APTT: aPTT: 26 seconds (ref 24–36)

## 2019-05-07 IMAGING — CT CT HEAD CODE STROKE
3 series · 15 of 47 positions shown, 18 images · non-contrast
Comparison: Head CT [DATE]. Brain MRI [DATE].

CLINICAL DATA: Code stroke. 58-year-old female with aphasia last
seen normal [[place]] hours.

EXAM:
CT HEAD WITHOUT CONTRAST
TECHNIQUE: Contiguous axial images were obtained from the base of the skull
through the vertex without intravenous contrast.

[Series 4: head 5.0 st · axial · 0.46mm/px · z∈[-139,-9]mm · 9 of 32 slices shown, 12 images]
[im 3/32  brain]
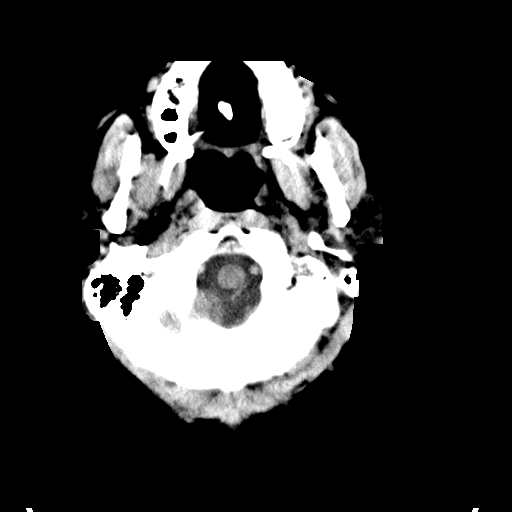
[im 3/32  bone]
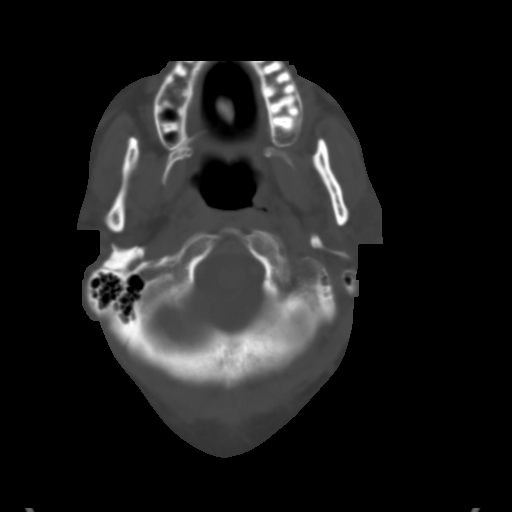
[im 6/32  brain]
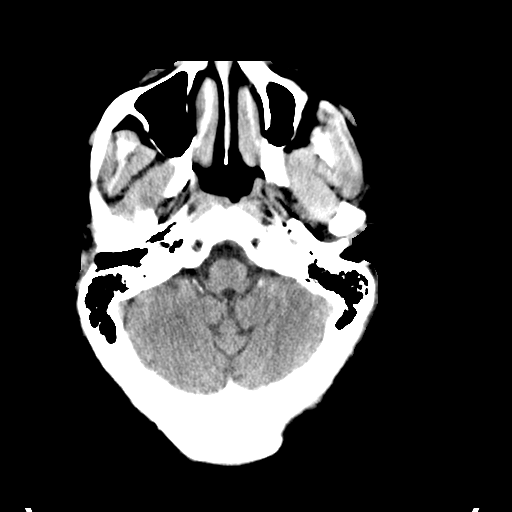
[im 9/32  brain]
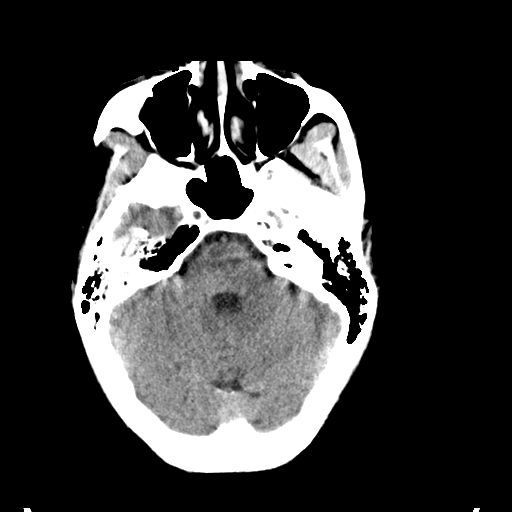
[im 12/32  brain]
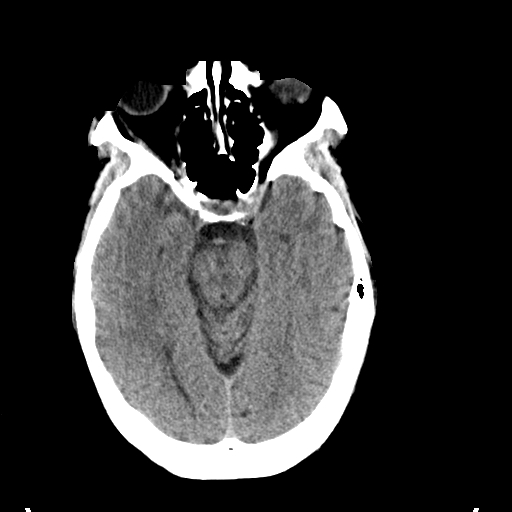
[im 17/32  brain]
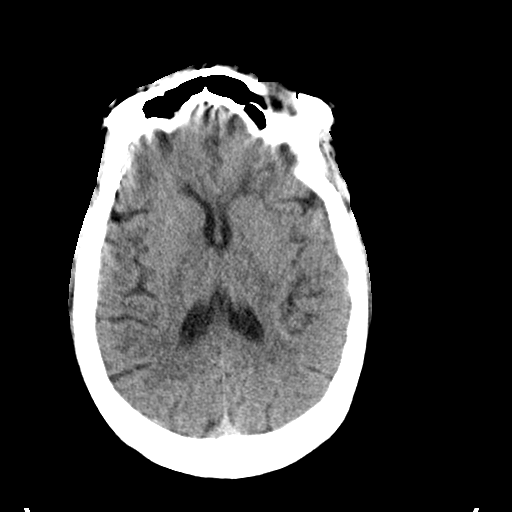
[im 17/32  bone]
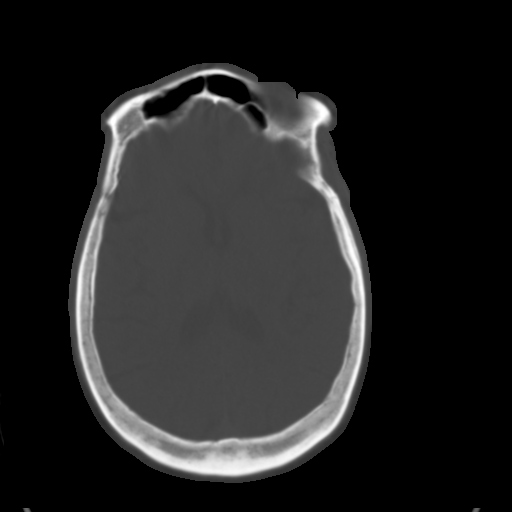
[im 20/32  brain]
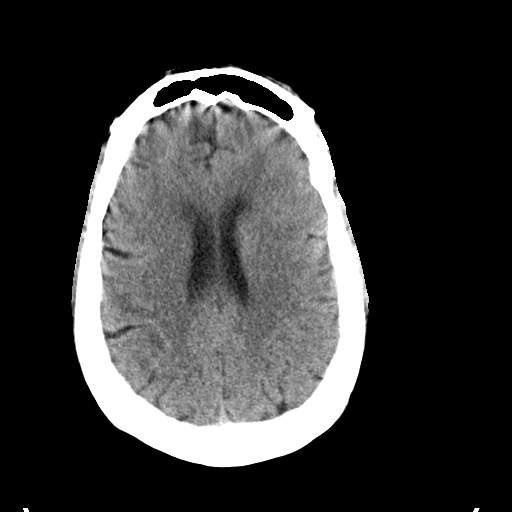
[im 23/32  brain]
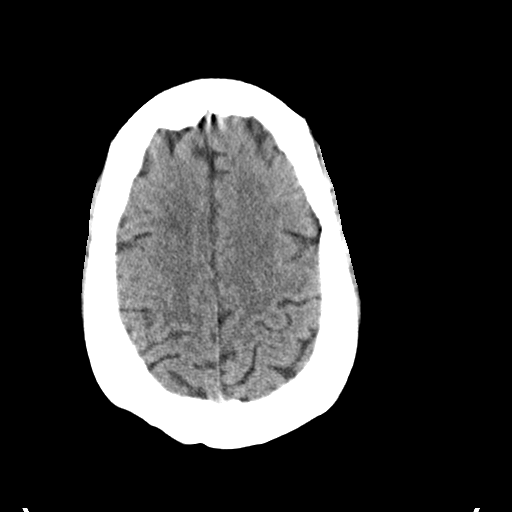
[im 26/32  brain]
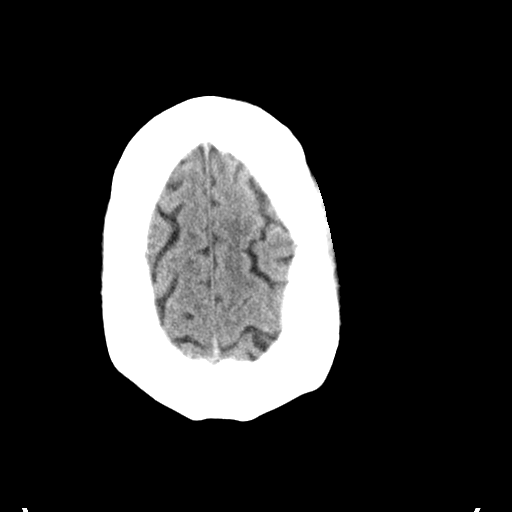
[im 29/32  brain]
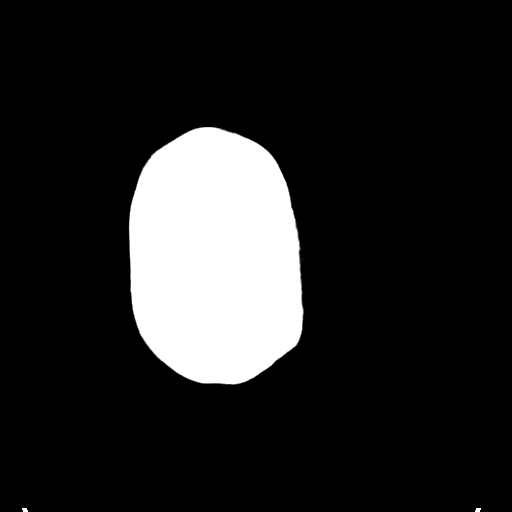
[im 29/32  bone]
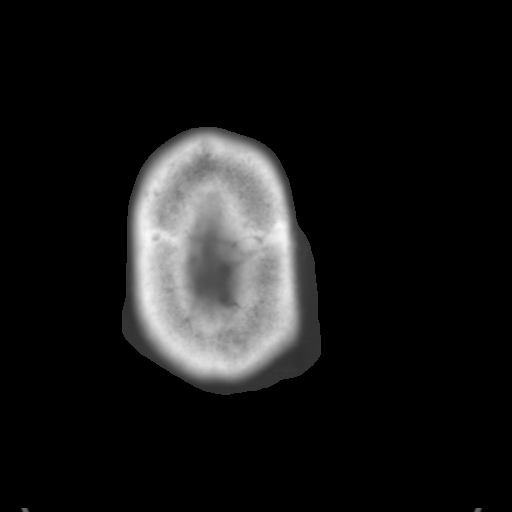

[Series 5: head 3.0 cor st · coronal · 0.31mm/px · 3 of 70 slices shown]
[im 24/70  brain]
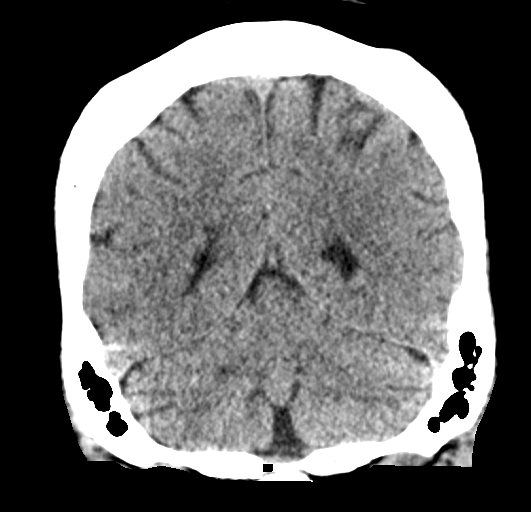
[im 31/70  brain]
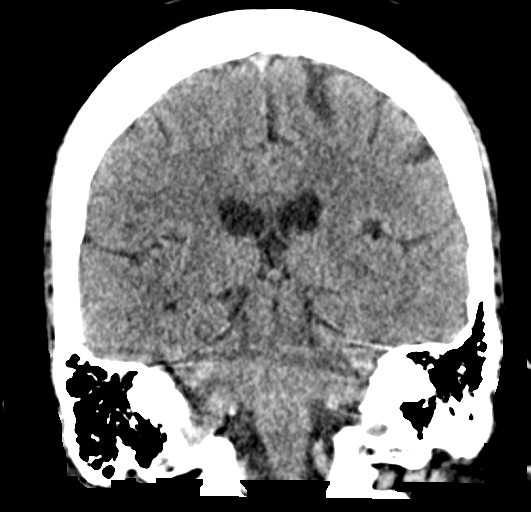
[im 39/70  brain]
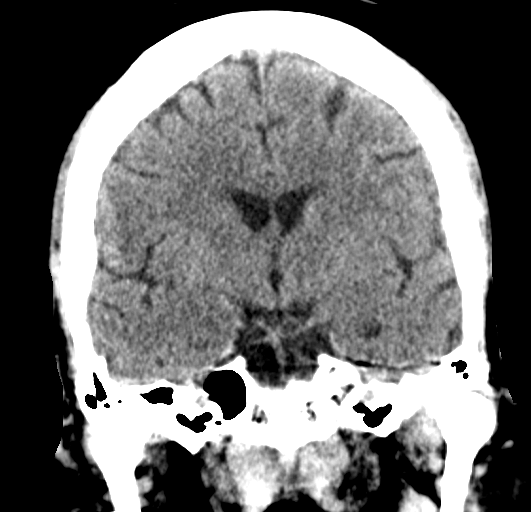

[Series 6: head 3.0 sag st · sagittal · 0.31mm/px · 3 of 67 slices shown]
[im 23/67  brain]
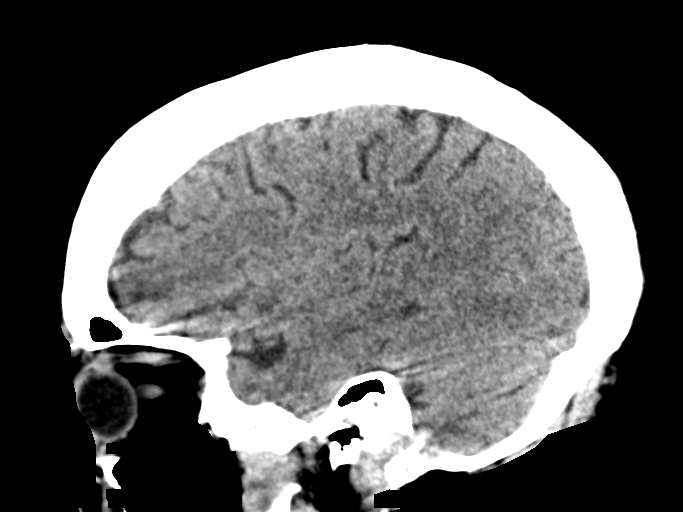
[im 34/67  brain]
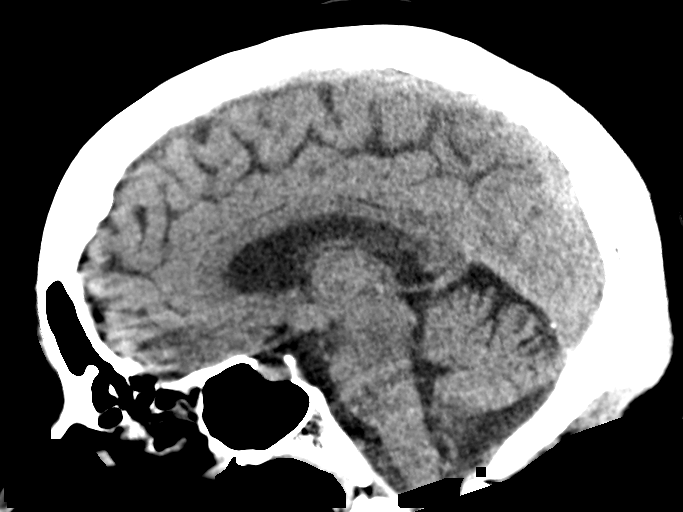
[im 45/67  brain]
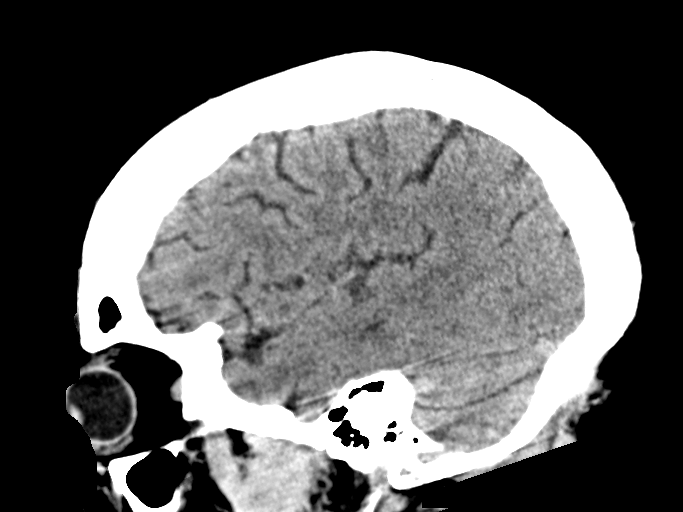

[15 of 47 positions shown; findings below may reference images not displayed]

FINDINGS: Brain: Stable and normal cerebral volume. Mild motion artifact.
Chronic hypodensity anterior to the right frontal horn is stable
since [[place]]. Mild contralateral left periventricular white matter
hypodensity. No midline shift, ventriculomegaly, mass effect,
evidence of mass lesion, intracranial hemorrhage or evidence of
cortically based acute infarction. No cortical encephalomalacia
identified.

Vascular: No suspicious intracranial vascular hyperdensity, the left

Skull: Stable, negative.

Sinuses/Orbits: Visualized paranasal sinuses and mastoids are stable
and well pneumatized.

Other: No acute orbit or scalp soft tissue finding.

ASPECTS (Alberta Stroke Program Early CT Score)

Total score (0-10 with 10 being normal): 10
IMPRESSION: 1. Stable non contrast CT appearance of the brain since [[place]], with
mild chronic periventricular white matter changes.
2. ASPECTS 10.
3. These results were communicated to Dr. GREEN at [DATE] pmon
[DATE]by text page via the AMION messaging system.

## 2019-05-07 IMAGING — CT CT ANGIO NECK
2 of 7 series · 8 of 33 positions shown · IV contrast (OMNI 350)
Comparison: Plain head CT [4C] hours today.

CLINICAL DATA: 58-year-old female code stroke presentation.

EXAM:
CT ANGIOGRAPHY HEAD AND NECK
TECHNIQUE: Multidetector CT imaging of the head and neck was performed using
the standard protocol during bolus administration of intravenous
contrast. Multiplanar CT image reconstructions and MIPs were
obtained to evaluate the vascular anatomy. Carotid stenosis
measurements (when applicable) are obtained utilizing NASCET
criteria, using the distal internal carotid diameter as the
denominator.
CONTRAST:  75mL OMNIPAQUE IOHEXOL 350 MG/ML SOLN

[Series 5: cta neck · axial · 0.50mm/px · z∈[-221,-103]mm · 2 of 177 slices shown]
[im 59/177  soft-tissue]
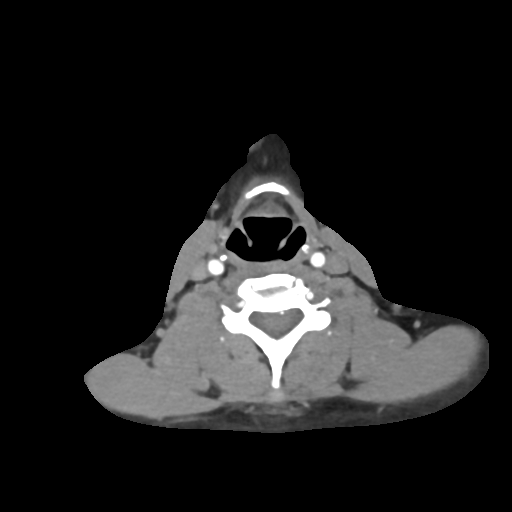
[im 118/177  soft-tissue]
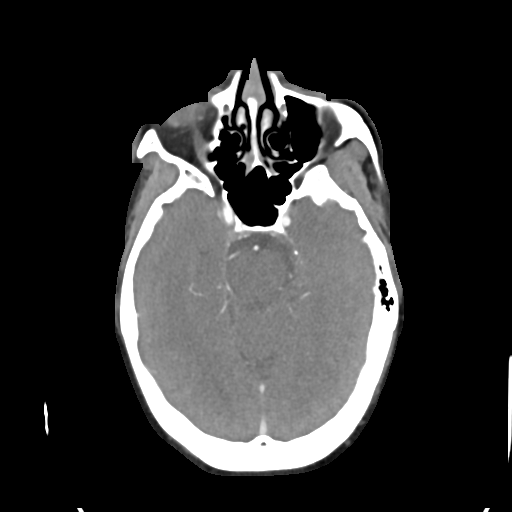

[Series 7: cta neck axial · axial · 0.39mm/px · z∈[-287,-38]mm · 6 of 351 slices shown]
[im 51/351  soft-tissue]
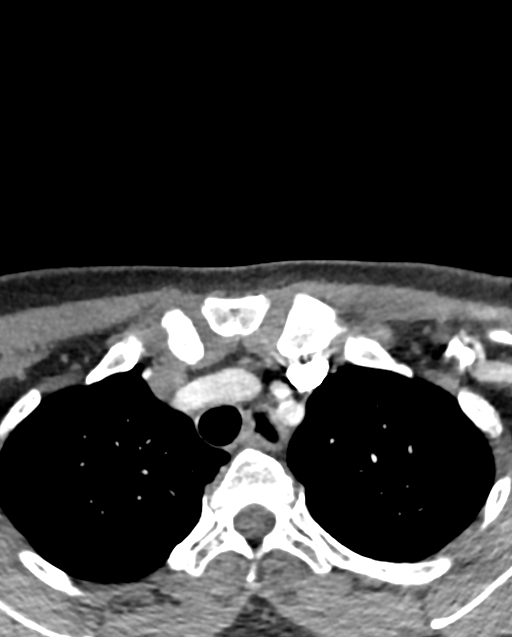
[im 101/351  bone]
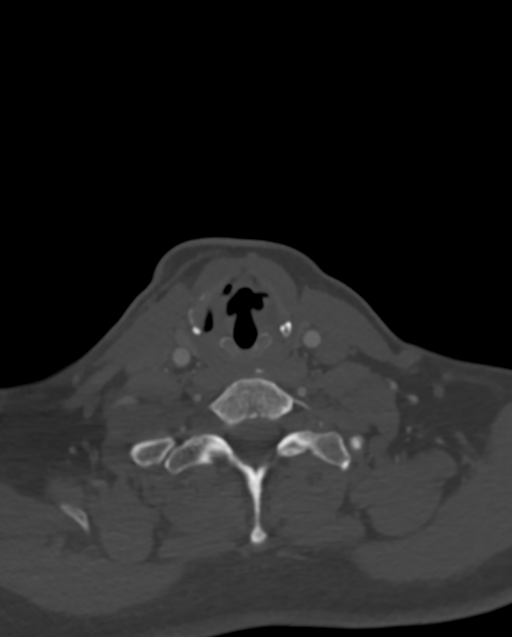
[im 151/351  soft-tissue]
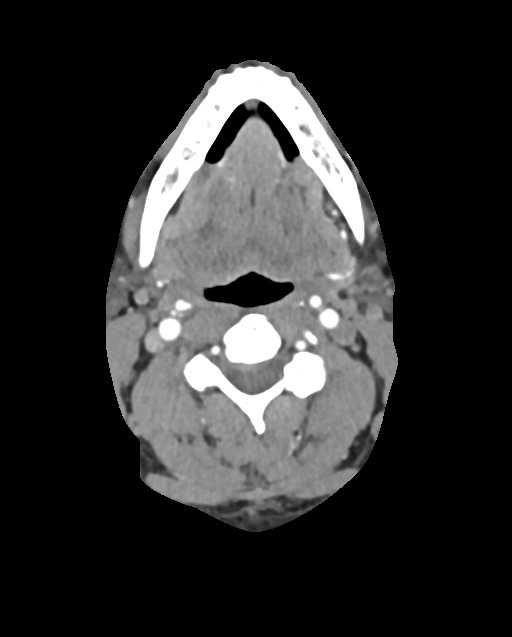
[im 201/351  bone]
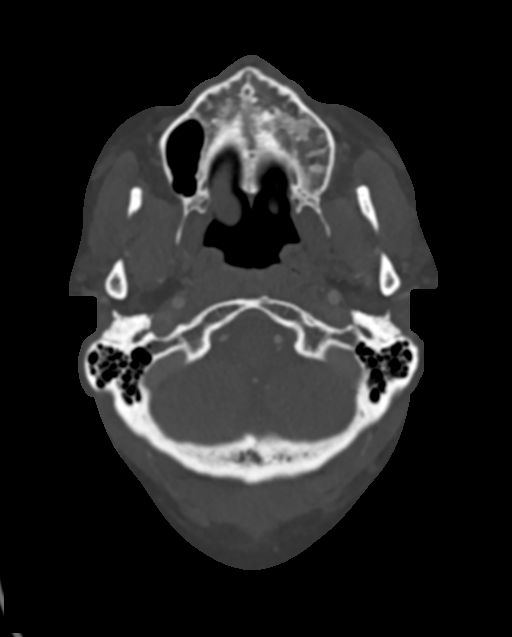
[im 251/351  soft-tissue]
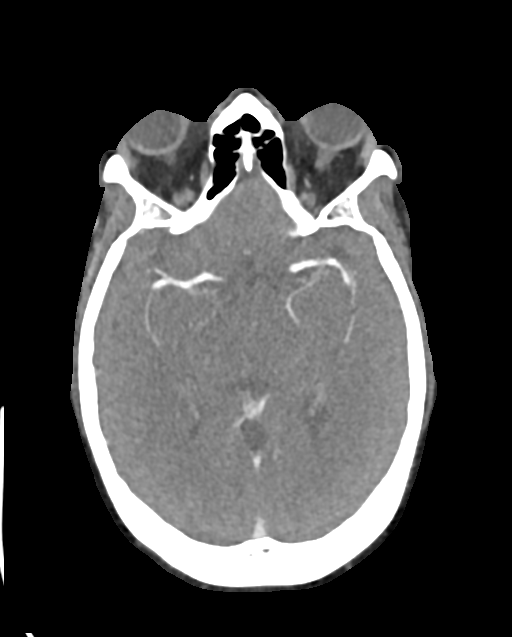
[im 301/351  bone]
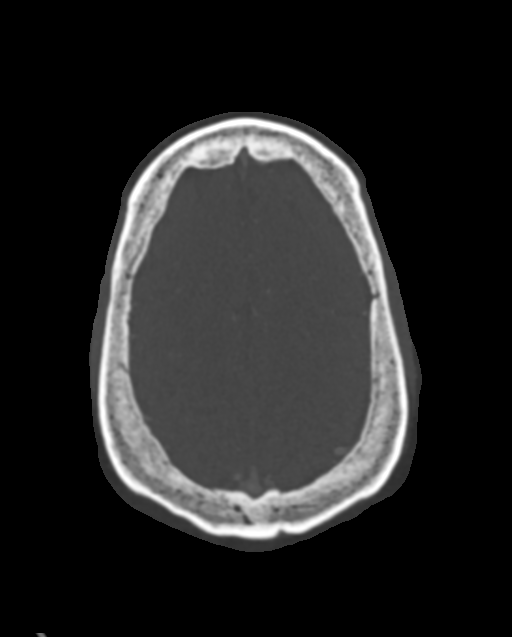

[8 of 33 positions shown; findings below may reference images not displayed]

FINDINGS: CTA NECK

Skeleton: No acute osseous abnormality identified.

Upper chest: Negative.

Other neck: Negative.

Aortic arch: 3 vessel arch configuration. Minimal arch
atherosclerosis.

Right carotid system: Negative.

Left carotid system: Negative.

Vertebral arteries:
Normal proximal subclavian arteries and vertebral artery origins.
Both vertebral arteries are tortuous to the skull base, but there is
no plaque or stenosis. The left vertebral appears mildly dominant.

CTA HEAD

Posterior circulation: Mildly dominant left V4. No distal vertebral
plaque or stenosis. Normal PICA origins and vertebrobasilar
junction. Patent basilar artery without stenosis. Normal SCA and
fetal type bilateral PCA origins. Bilateral PCAs are within normal
limits.

Anterior circulation: Both ICA siphons are patent. The right siphon
is patent without plaque or stenosis. Normal right posterior
communicating artery origin.

The left siphon is patent without plaque or stenosis, but there is a
saccular aneurysm arising at the origin of the left posterior
communicating artery, fetal type left PCA. See series 8, image 108,
series 7, image 113 and series 9, image 103. This measures up to 5
mm diameter. The left posterior communicating seems to arise from a
medial wall of the aneurysm.

Patent carotid termini. Normal MCA and ACA origins. Diminutive or
absent anterior communicating artery. Bilateral ACA branches are
within normal limits. Right MCA M1 segment and bifurcation are
patent without stenosis. Right MCA branches are within normal
limits. Left MCA M1 segment and trifurcation are patent without
stenosis. And no left MCA branch occlusion is identified. Left MCA
branches appear within normal limits.

Venous sinuses: Early contrast timing, but the major dural venous
sinuses appear patent.

Anatomic variants: Fetal type bilateral PCA origins and mildly
dominant left vertebral artery.

Review of the MIP images confirms the above findings
IMPRESSION: 1. Negative for large vessel occlusion, but positive for a 5 mm
Saccular Aneurysm arising at the origin of the Left Posterior
Communicating Artery, which constitutes a fetal type Left PCA.

2. No superimposed atherosclerosis or stenosis identified in the
head or neck.

These results were communicated to Dr. DEVER at [DATE] pmon
[DATE]by text page via the AMION messaging system.

## 2019-05-07 MED ORDER — LORAZEPAM 2 MG/ML IJ SOLN
1.0000 mg | Freq: Once | INTRAMUSCULAR | Status: AC
  Administered 2019-05-07: 14:00:00 1 mg via INTRAVENOUS
  Filled 2019-05-07: qty 1

## 2019-05-07 MED ORDER — IOHEXOL 350 MG/ML SOLN
75.0000 mL | Freq: Once | INTRAVENOUS | Status: AC | PRN
  Administered 2019-05-07: 75 mL via INTRAVENOUS

## 2019-05-07 MED ORDER — POTASSIUM CHLORIDE CRYS ER 20 MEQ PO TBCR
40.0000 meq | EXTENDED_RELEASE_TABLET | Freq: Once | ORAL | Status: AC
  Administered 2019-05-07: 40 meq via ORAL
  Filled 2019-05-07: qty 2

## 2019-05-07 MED ORDER — LACTATED RINGERS IV BOLUS
1000.0000 mL | Freq: Once | INTRAVENOUS | Status: AC
  Administered 2019-05-07: 16:00:00 1000 mL via INTRAVENOUS

## 2019-05-07 MED ORDER — SODIUM CHLORIDE 0.9% FLUSH
3.0000 mL | Freq: Once | INTRAVENOUS | Status: AC
  Administered 2019-05-07: 3 mL via INTRAVENOUS

## 2019-05-07 NOTE — Discharge Instructions (Addendum)
Follow-up closely as discussed with your local doctors.  Return for new or worsening symptoms especially stroke signs such as change in speech, weakness on one side, confusion, other.

## 2019-05-07 NOTE — ED Triage Notes (Signed)
Pt here from home as Code Stroke-at [id number] pt became confused, had difficulty speaking, and was unable to walk without assistance, witnessed by husband. Pt nauseas, 4 mg zofran given PTA. Pt tremulous on arrival, denies pain.

## 2019-05-07 NOTE — Consult Note (Addendum)
Neurology Consultation  Reason for Consult: Code stroke   Difficulty talking, generalized tremulousness Referring physician: Dr. Theotis Burrow, EDP  CC: Difficulty talking, generalized tremulousness  History is obtained from: Chart, patient's daughter and husband at bedside, EMS  HPI: Tammy Rodgers is a 59 y.o. 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  She has some baseline tremor and anxiety at baseline but this tremor was very prominent. She was able to cooperate with the EMS but not able to speak correctly. Due to her witnessed onset and focal neurological deficits a code stroke was activated and she was brought in to the hospital for further evaluation. She denies any current stressors She denies any similar episodes in the past She is a patient of Dr. Krista Blue at [facility]. Her last EEG from [place] [facility]-10/25/2015-normal. Care everywhere chart review reveals she was a patient of Dr. Baltazar Najjar prior to Dr. Narda Amber. She reports being a patient for Dr. Loletha Grayer, who is now retired, who had done many EEGs before.  Unable to review any notes from him. Denies fevers chills.  Denies anxiety.  Denies nausea vomiting.  Denies chest pain shortness of breath. Reports compliance to medications-Lamictal 200 every morning and 400 every afternoon. Last Lamictal level last month was mildly.  LKW: 1:05 PM today tpa given?: no, nonfocal inconsistent exam Premorbid modified Rankin scale (mRS): 0  ROS: Review of systems performed and negative except noted in HPI  Past Medical History:  Diagnosis Date  . Complex partial seizure disorder ([place])    last seizure  09/2012  . High cholesterol   . Hypertension    states BP is up and down; has been on med. x 2 mos.  . 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 Pre-diabetes   . Seizures ([place])   . Sleep apnea    uses CPAP nightly    Family History  Problem Relation Age of Onset  . Stroke Mother   . Heart attack Mother   . Diabetes Mother   . COPD Father   . Heart disease Unknown     Social History:   reports that she has never smoked. She has never used smokeless tobacco. She reports that she does not drink alcohol or use drugs.  Medications  Current Facility-Administered Medications:  .  sodium chloride flush (NS) 0.9 % injection 3 mL, 3 mL, Intravenous, Once, Little, Wenda Overland, MD  Current Outpatient Medications:  .  amLODipine (NORVASC) 5 MG tablet, Take 10 mg by mouth daily. , Disp: , Rfl:  .  ezetimibe-simvastatin (VYTORIN) 10-20 MG per tablet, Take 1 tablet by mouth at bedtime., Disp: , Rfl:  .  LAMICTAL 200 MG tablet, TAKE 1 TABLET IN THE MORNING AND 2 TABLETS IN THE EVENING, Disp: 270 tablet, Rfl: 3 .  LOSARTAN POTASSIUM-HCTZ PO, Take by mouth., Disp: , Rfl:  .  sitaGLIPtin-metformin (JANUMET) 50-500 MG tablet, Take 1 tablet by mouth 2 (two) times daily with a meal. Patient taking half, Disp: , Rfl:    Exam: Current vital signs: BP (!) 143/79   Pulse 91   Temp 97.8 F (36.6 C) (Temporal)   Resp (!) 34   SpO2 96%  Vital signs in last 24 hours: Temp:  [97.8 F (  36.6 C)] 97.8 F (36.6 C) (04/24 1411) Pulse Rate:  [85-91] 91 (04/24 1415) Resp:  [20-34] 34 (04/24 1415) BP: (143-157)/(68-79) 143/79 (04/24 1415) SpO2:  [96 %-100 %] 96 % (04/24 1415)  General: Awake alert, crying HEENT: Normocephalic atraumatic Lungs clear Cardiovascular: Regular rate rhythm Abdomen soft nondistended nontender Extremities warm well perfused Neurological exam She is awake alert oriented x3 She appears very worried and is crying.  Upon asking if anything is bothering her she was unable to  pinpoint a cause of what is making her uncomfortable, Her speech is stuttering There is no aphasia Cranial nerves: Pupils equal round react light, extraocular movements intact, visual field full, facial sensation intact, she appears to be drawing the left face a little bit while trying to talk but on smiling she has no facial asymmetry.  The husband says some left facial asymmetry is baseline for her.  Tongue is midline.  Palate is midline. Motor exam: She is antigravity in all fours.  She has this large amplitude volitional looking tremor which is easily suppressible by distraction. Sensory: Intact to touch all over Coordination: Upon trying finger-nose-finger test the tremor again appears pronounced. Gait testing deferred at this time NIH stroke scale 1a Level of Conscious.: 0 1b LOC Questions: 0 1c LOC Commands: 0 2 Best Gaze: 0 3 Visual: 0 4 Facial Palsy: 0 5a Motor Arm - left: 0 5b Motor Arm - Right: 0 6a Motor Leg - Left: 0 6b Motor Leg - Right: 0 7 Limb Ataxia: 1 8 Sensory: 0 9 Best Language: 0 10 Dysarthria: 1 11 Extinct. and Inatten.: 0 TOTAL: 2  Labs I have reviewed labs in epic and the results pertinent to this consultation are: CBC    Component Value Date/Time   WBC 3.0 (L) 11/29/2015 0854   WBC 4.9 08/01/2013 0113   RBC 3.94 11/29/2015 0854   RBC 4.05 08/01/2013 0113   HGB 13.6 05/07/2019 1351   HGB 12.4 11/29/2015 0854   HCT 40.0 05/07/2019 1351   HCT 35.9 11/29/2015 0854   PLT 243 11/29/2015 0854   MCV 91 11/29/2015 0854   MCH 31.5 11/29/2015 0854   MCH 31.4 08/01/2013 0113   MCHC 34.5 11/29/2015 0854   MCHC 33.1 08/01/2013 0113   RDW 13.9 11/29/2015 0854   LYMPHSABS 2.8 08/01/2013 0113   MONOABS 0.5 08/01/2013 0113   EOSABS 0.2 08/01/2013 0113   BASOSABS 0.0 08/01/2013 0113    CMP     Component Value Date/Time   NA 141 05/07/2019 1351   NA 142 11/29/2015 0854   K 3.1 (L) 05/07/2019 1351   CL 102 05/07/2019 1351   CO2 24 11/29/2015 0854    GLUCOSE 82 05/07/2019 1351   BUN 13 05/07/2019 1351   BUN 13 11/29/2015 0854   CREATININE 0.70 05/07/2019 1351   CALCIUM 9.9 11/29/2015 0854   PROT 7.4 11/29/2015 0854   ALBUMIN 4.7 11/29/2015 0854   AST 22 11/29/2015 0854   ALT 21 11/29/2015 0854   ALKPHOS 97 11/29/2015 0854   BILITOT <0.2 11/29/2015 0854   GFRNONAA 91 11/29/2015 0854   GFRAA 105 11/29/2015 0854   Imaging I have reviewed the images obtained: CT-scan of the brain-nothing acute CTA head and neck-no LVO.  Incidental finding made of 5 mm left P-comm aneurysm.   Assessment:  59 year old past history as above presenting for word finding difficulty and inability to standing from chair. Examination with stuttering speech and nonorganic-looking tremulous movements. Symptoms likely related to anxiety. Noncontrast  head CT unremarkable for acute process CT head and neck unremarkable for any LVO-incidental finding of a 5 mm left P-comm aneurysm. She was given 1 mg of Ativan IV in the ER with considerable resolution of symptoms. I think this presentation does not represent a seizure or a stroke but rather an anxiety attack/panic attack.  Impression: Strokelike symptoms-unlikely that this is a stroke Possible anxiety  Recommendations: I would observe her for a few hours in the emergency room. Check BMP CBC. Check urinalysis Send out a Lamictal level She had taken her Lamictal dose this morning so no need to give her another dose till she is due for her night dose. Continue to maintain seizure precautions I would consider doing an MRI and an EEG if her symptoms recur or do not improve in the next couple of hours after hydration and basic supportive measures in the emergency room. I had a detailed discussion about this with her daughter, who is also a CNM at the hospital and her husband at bedside. Discussed my plan in detail with Dr. Rex Kras in the emergency room as well.  -- Amie Portland, MD [facility] Pager: [phone] If 7pm to 7am, please call on call as listed on AMION.   Addendum Briefly spoke with the patient again. Feels much better. No tremors or tremulousness at this time. Basic labs unremarkable. UA unremarkable  No further neurological interventions at this time  Follow-up at outpatient clinic as planned  Follow-up 5 mm P-comm aneurysm - [facility] to advise outpatient f/u with [facility] - Dr. Estanislado Pandy or Dr. Ladean Raya ([place])  Plan discussed with ED provider   -- Amie Portland, MD [facility] Pager: [phone] If 7pm to 7am, please call on call as listed on AMION.

## 2019-05-07 NOTE — ED Provider Notes (Signed)
Patient was reassessed and feels much improved with fluids.  Blood work reassuring, urinalysis no signs of infection.  Vital signs normal.  Discussed with neurology recommends close outpatient follow-up.  Golda Acre, MD 05/07/19 [phone]

## 2019-05-07 NOTE — ED Notes (Signed)
Husband and daughter in consultation A

## 2019-05-07 NOTE — ED Provider Notes (Signed)
[place] EMERGENCY DEPARTMENT Provider Note   CSN: [id number] Arrival date & time: 05/07/19  1339  An emergency department physician performed an initial assessment on this suspected stroke patient at 1340.  History Chief Complaint  Patient presents with  . Code Stroke    Tammy Rodgers is a 59 y.o. female.  59yo F w/ PMH including complex partial seizure d/o, HTN, HLD, OSA who p/w speech difficulty. At 1:05pm today, pt became confused, shaky, and was having difficulty getting words out. She was unable to walk unassisted. Code stroke was called by EMS, gave 70m zofran in route for nausea. Pt has had no recent illness, no recent changes to medications.  Level 5 caveat due to ams  The history is provided by the patient and the EMS personnel.       Past Medical History:  Diagnosis Date  . [place] partial [place] disorder ([place]    last seizure 09/2012  . High cholesterol   . Hypertension    states BP is up and down; has been on med. x 2 mos.  . 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 Pre-diabetes   . Seizures ([facility]   . Sleep apnea    uses CPAP nightly    Patient Active Problem List   Diagnosis Date Noted  . Bilateral carpal tunnel syndrome 04/28/2018  . Paresthesia 01/28/2017  . Encounter for therapeutic drug monitoring 04/17/2016  . Falls 12/10/2015  . Acute confusional state 12/10/2015  . Weakness 12/10/2015  . Seizure disorder, complex partial ([place] 01/15/2014  . Hypersomnia, persistent 03/06/2013  . Obstructive sleep apnea 11/14/2012  . Bloody discharge from left nipple 09/27/2012    Past Surgical History:  Procedure Laterality Date  . ABDOMINAL HYSTERECTOMY  12/13/2002   partial  . BREAST LUMPECTOMY WITH NEEDLE LOCALIZATION Left 11/24/2012   Procedure: LEFT BREAST LUMPECTOMY WITH NEEDLE LOCALIZATION;  Surgeon: DHarl Bowie MD;  [facility]  Service: General;  Laterality: Left;  . CHOLECYSTECTOMY    .  PARATHYROIDECTOMY     partial     OB History   No obstetric history on file.     Family History  Problem Relation Age of Onset  . Stroke Mother   . Heart attack Mother   . Diabetes Mother   . COPD Father   . Heart disease Unknown     Social History   Tobacco Use  . Smoking status: Never Smoker  . Smokeless tobacco: Never Used  Substance Use Topics  . Alcohol use: No  . Drug use: No    Home Medications Prior to Admission medications   Medication Sig Start Date End Date Taking? Authorizing Provider  amLODipine (NORVASC) 5 MG tablet Take 10 mg by mouth daily.     [provider]  ezetimibe-simvastatin (VYTORIN) 10-20 MG per tablet Take 1 tablet by mouth at bedtime.    [provider]  LAMICTAL 200 MG tablet TAKE 1 TABLET IN THE MORNING AND 2 TABLETS IN THE EVENING 07/05/18   YMarcial Pacas MD  LOSARTAN POTASSIUM-HCTZ PO Take by mouth.    [provider]  sitaGLIPtin-metformin (JANUMET) 50-500 MG tablet Take 1 tablet by mouth 2 (two) times daily with a meal. Patient taking half    [provider]    Allergies    Iodinated diagnostic agents and Ritalin [methylphenidate hcl]  Review of Systems   Review of Systems  Unable to perform ROS: Other  speech problems  Physical Exam Updated Vital  Signs BP (!) 143/79   Pulse 91   Temp 97.8 F (36.6 C) (Temporal)   Resp (!) 34   SpO2 96%   Physical Exam Vitals and nursing note reviewed.  Constitutional:      Appearance: She is well-developed.     Comments: Awake, alert, anxious  HENT:     Head: Normocephalic and atraumatic.  Eyes:     Extraocular Movements: Extraocular movements intact.     Conjunctiva/sclera: Conjunctivae normal.     Pupils: Pupils are equal, round, and reactive to light.  Cardiovascular:     Rate and Rhythm: Normal rate and regular rhythm.     Heart sounds: Normal heart sounds. No murmur.  Pulmonary:     Effort: Pulmonary effort is normal. No respiratory distress.      Breath sounds: Normal breath sounds.  Abdominal:     General: Bowel sounds are normal. There is no distension.     Palpations: Abdomen is soft.     Tenderness: There is no abdominal tenderness.  Musculoskeletal:     Cervical back: Neck supple.  Skin:    General: Skin is warm and dry.  Neurological:     Mental Status: She is alert.     Cranial Nerves: No cranial nerve deficit.     Motor: No abnormal muscle tone.     Deep Tendon Reflexes: Reflexes are normal and symmetric.     Comments: Alert, difficulty w/ speech w/ stuttering speech but no dysarthria; facial asymmetry at mouth that corrects w/ smiling; tremor b/l with finger-to-nose testing, no clonus 5/5 strength and normal sensation x all 4 extremities  Psychiatric:     Comments: Anxious, tremulous     ED Results / Procedures / Treatments   Labs (all labs ordered are listed, but only abnormal results are displayed) Labs Reviewed  DIFFERENTIAL - Abnormal; Notable for the following components:      Result Value   Neutro Abs 1.6 (*)    All other components within normal limits  COMPREHENSIVE METABOLIC PANEL - Abnormal; Notable for the following components:   Potassium 3.4 (*)    Calcium 11.0 (*)    Total Protein 8.4 (*)    Albumin 5.1 (*)    All other components within normal limits  I-STAT CHEM 8, ED - Abnormal; Notable for the following components:   Potassium 3.1 (*)    All other components within normal limits  PROTIME-INR  APTT  CBC  URINALYSIS, ROUTINE W REFLEX MICROSCOPIC  LAMOTRIGINE LEVEL  CBG MONITORING, ED  I-STAT BETA HCG BLOOD, ED ([facility], [facility], [facility] ONLY)    EKG None  Radiology CT Code Stroke CTA Head W/WO contrast  Result Date: 05/07/2019 CLINICAL DATA:  59 year old female code stroke presentation. EXAM: CT ANGIOGRAPHY HEAD AND NECK TECHNIQUE: Multidetector CT imaging of the head and neck was performed using the standard protocol during bolus administration of intravenous contrast. Multiplanar CT image  reconstructions and MIPs were obtained to evaluate the vascular anatomy. Carotid stenosis measurements (when applicable) are obtained utilizing NASCET criteria, using the distal internal carotid diameter as the denominator. CONTRAST:  95m OMNIPAQUE IOHEXOL 350 MG/ML SOLN COMPARISON:  Plain head CT 1348 hours today. FINDINGS: CTA NECK Skeleton: No acute osseous abnormality identified. Upper chest: Negative. Other neck: Negative. Aortic arch: 3 vessel arch configuration. Minimal arch atherosclerosis. Right carotid system: Negative. Left carotid system: Negative. Vertebral arteries: Normal proximal subclavian arteries and vertebral artery origins. Both vertebral arteries are tortuous to the skull base, but there is no  plaque or stenosis. The left vertebral appears mildly dominant. CTA HEAD Posterior circulation: Mildly dominant left V4. No distal vertebral plaque or stenosis. Normal PICA origins and vertebrobasilar junction. Patent basilar artery without stenosis. Normal SCA and fetal type bilateral PCA origins. Bilateral PCAs are within normal limits. Anterior circulation: Both ICA siphons are patent. The right siphon is patent without plaque or stenosis. Normal right posterior communicating artery origin. 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 See series 8, image 108, series 7, image 113 and series 9, image 103. This measures up to 5 mm diameter. The left posterior communicating seems to arise from a medial wall of the aneurysm. Patent carotid termini. Normal MCA and ACA origins. Diminutive or absent anterior communicating artery. Bilateral ACA branches are within normal limits. Right MCA M1 segment and bifurcation are patent without stenosis. Right MCA branches are within normal limits. Left MCA M1 segment and trifurcation are patent without stenosis. And no left MCA branch  occlusion is identified. Left MCA branches appear within normal limits. Venous sinuses: Early contrast timing, but the major dural venous sinuses appear patent. Anatomic variants: Fetal type bilateral PCA origins and mildly dominant left vertebral artery. Review of the MIP images confirms the above findings IMPRESSION: 1. 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 2. No superimposed atherosclerosis or stenosis identified in the head or neck. These results were communicated to Dr. Rory Percy at 2:14 pmon 4/24/2021by text page via the [facility] messaging system. Electronically Signed   By: Genevie Ann M.D.   On: 05/07/2019 14:15   CT Code Stroke CTA Neck W/WO contrast  Result Date: 05/07/2019 CLINICAL DATA:  59 year old female code stroke presentation. EXAM: CT ANGIOGRAPHY HEAD AND NECK TECHNIQUE: Multidetector CT imaging of the head and neck was performed using the standard protocol during bolus administration of intravenous contrast. Multiplanar CT image reconstructions and MIPs were obtained to evaluate the vascular anatomy. Carotid stenosis measurements (when applicable) are obtained utilizing NASCET criteria, using the distal internal carotid diameter as the denominator. CONTRAST:  80m OMNIPAQUE IOHEXOL 350 MG/ML SOLN COMPARISON:  Plain head CT 1348 hours today. FINDINGS: CTA NECK Skeleton: No acute osseous abnormality identified. Upper chest: Negative. Other neck: Negative. Aortic arch: 3 vessel arch configuration. Minimal arch atherosclerosis. Right carotid system: Negative. Left carotid system: Negative. Vertebral arteries: Normal proximal subclavian arteries and vertebral artery origins. Both vertebral arteries are tortuous to the skull base, but there is no plaque or stenosis. The left vertebral appears mildly dominant. CTA HEAD Posterior circulation: Mildly dominant left V4.  No distal vertebral plaque or stenosis. Normal PICA origins and vertebrobasilar junction. Patent basilar artery without stenosis. Normal SCA and fetal type bilateral PCA origins. Bilateral PCAs are within normal limits. Anterior circulation: Both ICA siphons are patent. The right siphon is patent without plaque or stenosis. Normal right posterior communicating artery origin. 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 See series 8, image 108, series 7, image 113 and series 9, image 103. This measures up to 5 mm diameter. The left posterior communicating seems to arise from a medial wall of the aneurysm. Patent carotid termini. Normal MCA  and ACA origins. Diminutive or absent anterior communicating artery. Bilateral ACA branches are within normal limits. Right MCA M1 segment and bifurcation are patent without stenosis. Right MCA branches are within normal limits. Left MCA M1 segment and trifurcation are patent without stenosis. And no left MCA branch occlusion is identified. Left MCA branches appear within normal limits. Venous sinuses: Early contrast timing, but the major dural venous sinuses appear patent. Anatomic variants: Fetal type bilateral PCA origins and mildly dominant left vertebral artery. Review of the MIP images confirms the above findings IMPRESSION: 1. 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 2. No superimposed atherosclerosis or stenosis identified in the head or neck. These results were communicated to Dr. Rory Percy at 2:14 pmon 4/24/2021by text page via the [facility] messaging system. Electronically Signed   By: Genevie Ann M.D.   On: 05/07/2019 14:15   CT HEAD CODE STROKE WO CONTRAST  Result Date: 05/07/2019 CLINICAL DATA:  Code  stroke. 59 year old female with aphasia last seen normal 1305 hours. EXAM: CT HEAD WITHOUT CONTRAST TECHNIQUE: Contiguous axial images were obtained from the base of the skull through the vertex without intravenous contrast. COMPARISON:  Head CT 08/01/2013. Brain MRI 03/23/2019. FINDINGS: Brain: Stable and normal cerebral volume. Mild motion artifact. Chronic hypodensity anterior to the right frontal horn is stable since 2015. Mild contralateral left periventricular white matter hypodensity. No midline shift, ventriculomegaly, mass effect, evidence of mass lesion, intracranial hemorrhage or evidence of cortically based acute infarction. No cortical encephalomalacia identified. Vascular: No suspicious intracranial vascular hyperdensity, the left M1 appears stable by CT since 2015. Skull: Stable, negative. Sinuses/Orbits: Visualized paranasal sinuses and mastoids are stable and well pneumatized. Other: No acute orbit or scalp soft tissue finding. ASPECTS [facility] Stroke Program Early CT Score) Total score (0-10 with 10 being normal): 10 IMPRESSION: 1. Stable non contrast CT appearance of the brain since 2015, with mild chronic periventricular white matter changes. 2. ASPECTS 10. 3. These results were communicated to Dr. Rory Percy at [place] 4/24/2021by text page via the [facility] messaging system. Electronically Signed   By: Genevie Ann M.D.   On: 05/07/2019 13:56    Procedures Procedures (including critical care time) CRITICAL CARE Performed by: Wenda Overland Laneka Mcgrory   Total critical care time: 30 minutes  Critical care time was exclusive of separately billable procedures and treating other patients.  Critical care was necessary to treat or prevent imminent or life-threatening deterioration.  Critical care was time spent personally by me on the following activities: development of treatment plan with patient and/or surrogate as well as nursing, discussions with consultants, evaluation of patient's response to  treatment, examination of patient, obtaining history from patient or surrogate, ordering and performing treatments and interventions, ordering and review of laboratory studies, ordering and review of radiographic studies, pulse oximetry and re-evaluation of patient's condition.  Medications Ordered in ED Medications  sodium chloride flush (NS) 0.9 % injection 3 mL (has no administration in time range)  lactated ringers bolus 1,000 mL (has no administration in time range)  potassium chloride SA (KLOR-CON) CR tablet 40 mEq (has no administration in time range)  iohexol (OMNIPAQUE) 350 MG/ML injection 75 mL (75 mLs Intravenous Contrast Given 05/07/19 1355)  LORazepam (ATIVAN) injection 1 mg (1 mg Intravenous Given 05/07/19 1421)    ED Course  I have reviewed the triage vital signs and the nursing notes.  Pertinent labs & imaging results that were available during my care of the patient were reviewed by me and considered in my medical decision making (see chart for details).    MDM Rules/Calculators/A&P                      Pt arrived as code stroke, airway intact, VS stable, taken to CT scanner where she was met by Dr. Rory Percy.  Head CT negative acute.  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  No evidence of bleeding. Code stroke canceled and no indication for tPA based on exam.   Patient has been compliant with her medications, her symptoms currently do not appear to be consistent with partial seizures.  She did improve after Ativan and we suspect there may be a component of anxiety.  Patient has been reassessed by Dr. Rory Percy who has recommended supportive fluids and completion of screening labs.   We will obtain UA to r/o infection and ambulate patient. If she continues to do well, will likely d/c home.  Final Clinical Impression(s) / ED Diagnoses Final diagnoses:  None    Rx / DC  Orders ED Discharge Orders    None       Bettyanne Dittman, Wenda Overland, MD 05/07/19 [phone]

## 2019-05-07 NOTE — ED Notes (Signed)
Pt ambulated to and from bathroom with little to no assistance.

## 2019-05-08 ENCOUNTER — Encounter: Payer: Self-pay | Admitting: Neurology

## 2019-05-09 ENCOUNTER — Telehealth (HOSPITAL_COMMUNITY): Payer: Self-pay

## 2019-05-09 ENCOUNTER — Ambulatory Visit (INDEPENDENT_AMBULATORY_CARE_PROVIDER_SITE_OTHER): Admitting: Neurology

## 2019-05-09 ENCOUNTER — Encounter: Payer: Self-pay | Admitting: Neurology

## 2019-05-09 ENCOUNTER — Other Ambulatory Visit: Payer: Self-pay

## 2019-05-09 VITALS — BP 116/64 | HR 92 | Temp 97.8°F | Ht 63.0 in | Wt 160.0 lb

## 2019-05-09 DIAGNOSIS — I671 Cerebral aneurysm, nonruptured: Secondary | ICD-10-CM | POA: Insufficient documentation

## 2019-05-09 DIAGNOSIS — G40209 Localization-related (focal) (partial) symptomatic epilepsy and epileptic syndromes with complex partial seizures, not intractable, without status epilepticus: Secondary | ICD-10-CM | POA: Diagnosis not present

## 2019-05-09 LAB — LAMOTRIGINE LEVEL: Lamotrigine Lvl: 30 ug/mL — ABNORMAL HIGH (ref 2.0–20.0)

## 2019-05-09 MED ORDER — CLOBAZAM 10 MG PO TABS: 10.0000 mg | ORAL_TABLET | Freq: Every day | ORAL | 3 refills | Status: DC

## 2019-05-09 NOTE — Telephone Encounter (Signed)
New referral from ED for aneurysm. Called to schedule consult, no answer, left vm. AW
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
## 2019-05-09 NOTE — Patient Instructions (Signed)
Continue Lamictal  Check EEG  Start Onfi 10 mg at bedtime  Refer you to Interventional radiology with Dr. Estanislado Pandy  No driving until seizure free for 6 months  See you back in 3 months

## 2019-05-10 ENCOUNTER — Encounter: Payer: Self-pay | Admitting: Neurology

## 2019-05-10 ENCOUNTER — Ambulatory Visit (INDEPENDENT_AMBULATORY_CARE_PROVIDER_SITE_OTHER): Admitting: Neurology

## 2019-05-10 ENCOUNTER — Telehealth (HOSPITAL_COMMUNITY): Payer: Self-pay

## 2019-05-10 VITALS — BP 136/72 | HR 76 | Ht 63.0 in | Wt 160.0 lb

## 2019-05-10 DIAGNOSIS — G40209 Localization-related (focal) (partial) symptomatic epilepsy and epileptic syndromes with complex partial seizures, not intractable, without status epilepticus: Secondary | ICD-10-CM | POA: Insufficient documentation

## 2019-05-10 DIAGNOSIS — R269 Unspecified abnormalities of gait and mobility: Secondary | ICD-10-CM

## 2019-05-10 DIAGNOSIS — I671 Cerebral aneurysm, nonruptured: Secondary | ICD-10-CM | POA: Diagnosis not present
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
## 2019-05-10 NOTE — Telephone Encounter (Signed)
Returned pt's call, no answer, left vm. AW  

## 2019-05-13 NOTE — Procedures (Signed)
   HISTORY: 59 year old female, presented with episode of sudden onset lower extremity weakness, TECHNIQUE:  This is a routine 16 channel EEG recording with one channel devoted to a limited EKG recording.  It was performed during wakefulness, drowsiness and asleep.  Hyperventilation and photic stimulation were performed as activating procedures.  There are minimum muscle and movement artifact noted.  Upon maximum arousal, posterior dominant waking rhythm consistent of rhythmic alpha range activity. Activities are symmetric over the bilateral posterior derivations and attenuated with eye opening.  Hyperventilation produced mild/moderate buildup with higher amplitude and the slower activities noted.  Photic stimulation did not alter the tracing.  During EEG recording, patient developed drowsiness and entered sleep, sleep EEG demonstrated architecture, there were frontal centrally dominant vertex waves and symmetric sleep spindles noted.  During EEG recording, there was no epileptiform discharge noted.  EKG demonstrate sinus rhythm, with heart rate of 80 bpm  CONCLUSION: This is a  normal wake and sleep EEG.  There is no electrodiagnostic evidence of epileptiform discharge.  Marcial Pacas, M.D. Ph.D.  [facility] [place], [place] [address] Phone: [phone] Fax:      [phone]

## 2019-05-16 ENCOUNTER — Telehealth: Payer: Self-pay | Admitting: Neurology

## 2019-05-16 NOTE — Telephone Encounter (Signed)
Pt stated that someone left her a voicemail re: results.  Pt stated the person did not state their name or the results.  Pt is asking for a call back and said its ok to leave the results on her voicemail

## 2019-05-16 NOTE — Telephone Encounter (Signed)
Tammy Rodgers, CMA had called the patient to tell her the EEG was normal. The patient verbalized understanding of the results.

## 2019-05-19 ENCOUNTER — Other Ambulatory Visit: Payer: Self-pay

## 2019-05-19 ENCOUNTER — Ambulatory Visit (HOSPITAL_COMMUNITY)
Admission: RE | Admit: 2019-05-19 | Discharge: 2019-05-19 | Disposition: A | Discharge status: Home / Self Care | Source: Ambulatory Visit | Attending: Neurology | Admitting: Neurology

## 2019-05-19 DIAGNOSIS — I671 Cerebral aneurysm, nonruptured: Secondary | ICD-10-CM

## 2019-05-19 NOTE — H&P (Signed)
Chief Complaint: Patient was seen in consultation today for L PCA aneurysm  Referring Physician(s): Yan,Yijun  Supervising Physician: Luanne Bras  Patient Status: [facility] - Out-pt  History of Present Illness: Tammy Rodgers is a 59 y.o. female with past medical history of complex partial seizure d/o, HTN, DM, HLD who presented to [facility] ED with stroke-like symptoms 05/07/19. On presentation she was having word finding difficulty and was unable to walk without assistance. 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 Her symptoms quickly improved and she was discharged home in stable condition with Neurology follow-up.  She presents to [facility] Radiology today after referral from Dr. Rhea Belton office for evaluation and management of her aneurysm.   Tammy Rodgers presents today in her usual state of health.  She confirms the above history and denies any recurrence of stroke-like symptoms.  She continues to take her Lamictal, metformin, amlodipine, and losartan. She is interested in pursuing treatment for her aneurysm.  Her daughter, Tammy Rodgers, was included in the conservation with Dr. Estanislado Pandy via speaker phone.   Past Medical History:  Diagnosis Date  . Complex partial seizure disorder ([place])    last seizure 09/2012  . High cholesterol   . Hypertension    states BP is up and down; has been on med. x 2 mos.  . 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 Pre-diabetes   . Seizures ([place])   . Sleep apnea    uses CPAP nightly    Past Surgical History:  Procedure Laterality Date  . ABDOMINAL HYSTERECTOMY  12/13/2002   partial  . BREAST LUMPECTOMY WITH NEEDLE LOCALIZATION Left 11/24/2012   Procedure: LEFT BREAST LUMPECTOMY WITH NEEDLE LOCALIZATION;  Surgeon: Harl Bowie, MD;  Location: [place];  Service: General;  Laterality: Left;  .  CHOLECYSTECTOMY    . PARATHYROIDECTOMY     partial    Allergies: Iodinated diagnostic agents and Ritalin [methylphenidate hcl]  Medications: Prior to Admission medications   Medication Sig Start Date End Date Taking? Authorizing Provider  amLODipine (NORVASC) 5 MG tablet Take 10 mg by mouth daily.     [provider]  cloBAZam (ONFI) 10 MG tablet Take 1 tablet (10 mg total) by mouth at bedtime. 05/09/19   Suzzanne Cloud, NP  ezetimibe-simvastatin (VYTORIN) 10-20 MG per tablet Take 1 tablet by mouth at bedtime.    [provider]  LAMICTAL 200 MG tablet TAKE 1 TABLET IN THE MORNING AND 2 TABLETS IN THE EVENING 07/05/18   Marcial Pacas, MD  LOSARTAN POTASSIUM-HCTZ PO Take by mouth.    [provider]  METFORMIN HCL PO Take 0.5 tablets by mouth daily. She is unsure of mg.    [provider]     Family History  Problem Relation Age of Onset  . Stroke Mother   . Heart attack Mother   . Diabetes Mother   . COPD Father   . Heart disease Unknown     Social History   Socioeconomic History  . Marital status: Married    Spouse name: Not on file  . Number of children: 2  . Years of education: Bachelors  . Highest education level: Not on file  Occupational History  . Occupation: math Product manager: [employer]  Tobacco Use  . Smoking status: Never Smoker  . Smokeless tobacco: Never  Used  Substance and Sexual Activity  . Alcohol use: No  . Drug use: No  . Sexual activity: Not on file  Other Topics Concern  . Not on file  Social History Narrative   Lives at home with husband.   Right-handed.   No caffeine use.   Social Determinants of Health   Financial Resource Strain:   . Difficulty of Paying Living Expenses:   Food Insecurity:   . Worried About Charity fundraiser in the Last Year:   . Arboriculturist in the Last Year:   Transportation Needs:   . Film/video editor (Medical):   Marland Kitchen Lack of Transportation  (Non-Medical):   Physical Activity:   . Days of Exercise per Week:   . Minutes of Exercise per Session:   Stress:   . Feeling of Stress :   Social Connections:   . Frequency of Communication with Friends and Family:   . Frequency of Social Gatherings with Friends and Family:   . Attends Religious Services:   . Active Member of Clubs or Organizations:   . Attends Archivist Meetings:   Marland Kitchen Marital Status:      Review of Systems: A 12 point ROS discussed and pertinent positives are indicated in the HPI above.  All other systems are negative.  Review of Systems  Constitutional: Negative for fatigue and fever.  Respiratory: Negative for cough and shortness of breath.   Neurological: Negative for dizziness, facial asymmetry, light-headedness and headaches.  Psychiatric/Behavioral: Negative for behavioral problems and confusion.    Vital Signs: There were no vitals taken for this visit.  Physical Exam Vitals and nursing note reviewed.  Constitutional:      General: She is not in acute distress.    Appearance: Normal appearance.  HENT:     Mouth/Throat:     Mouth: Mucous membranes are moist.     Pharynx: Oropharynx is clear.  Eyes:     Extraocular Movements: Extraocular movements intact.  Musculoskeletal:     Cervical back: Normal range of motion.  Skin:    General: Skin is warm and dry.  Neurological:     General: No focal deficit present.     Mental Status: She is alert. Mental status is at baseline.  Psychiatric:        Mood and Affect: Mood normal.        Behavior: Behavior normal.        Thought Content: Thought content normal.        Judgment: Judgment normal.     Imaging: CT Code Stroke CTA Head W/WO contrast  Result Date: 05/07/2019 CLINICAL DATA:  59 year old female code stroke presentation. EXAM: CT ANGIOGRAPHY HEAD AND NECK TECHNIQUE: Multidetector CT imaging of the head and neck was performed using the standard protocol during bolus administration  of intravenous contrast. Multiplanar CT image reconstructions and MIPs were obtained to evaluate the vascular anatomy. Carotid stenosis measurements (when applicable) are obtained utilizing NASCET criteria, using the distal internal carotid diameter as the denominator. CONTRAST:  36mL OMNIPAQUE IOHEXOL 350 MG/ML SOLN COMPARISON:  Plain head CT 1348 hours today. FINDINGS: CTA NECK Skeleton: No acute osseous abnormality identified. Upper chest: Negative. Other neck: Negative. Aortic arch: 3 vessel arch configuration. Minimal arch atherosclerosis. Right carotid system: Negative. Left carotid system: Negative. Vertebral arteries: Normal proximal subclavian arteries and vertebral artery origins. Both vertebral arteries are tortuous to the skull base, but there is no plaque or stenosis. The left vertebral appears mildly  dominant. CTA HEAD Posterior circulation: Mildly dominant left V4. No distal vertebral plaque or stenosis. Normal PICA origins and vertebrobasilar junction. Patent basilar artery without stenosis. Normal SCA and fetal type bilateral PCA origins. Bilateral PCAs are within normal limits. Anterior circulation: Both ICA siphons are patent. The right siphon is patent without plaque or stenosis. Normal right posterior communicating artery origin. 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 See series 8, image 108, series 7, image 113 and series 9, image 103. This measures up to 5 mm diameter. The left posterior communicating seems to arise from a medial wall of the aneurysm. Patent carotid termini. Normal MCA and ACA origins. Diminutive or absent anterior communicating artery. Bilateral ACA branches are within normal limits. Right MCA M1 segment and bifurcation are patent without stenosis. Right MCA branches are within normal limits. Left MCA M1 segment and trifurcation are patent  without stenosis. And no left MCA branch occlusion is identified. Left MCA branches appear within normal limits. Venous sinuses: Early contrast timing, but the major dural venous sinuses appear patent. Anatomic variants: Fetal type bilateral PCA origins and mildly dominant left vertebral artery. Review of the MIP images confirms the above findings IMPRESSION: 1. 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 2. No superimposed atherosclerosis or stenosis identified in the head or neck. These results were communicated to Dr. Rory Percy at 2:14 pmon 4/24/2021by text page via the [facility] messaging system. Electronically Signed   By: Genevie Ann M.D.   On: 05/07/2019 14:15   CT Code Stroke CTA Neck W/WO contrast  Result Date: 05/07/2019 CLINICAL DATA:  59 year old female code stroke presentation. EXAM: CT ANGIOGRAPHY HEAD AND NECK TECHNIQUE: Multidetector CT imaging of the head and neck was performed using the standard protocol during bolus administration of intravenous contrast. Multiplanar CT image reconstructions and MIPs were obtained to evaluate the vascular anatomy. Carotid stenosis measurements (when applicable) are obtained utilizing NASCET criteria, using the distal internal carotid diameter as the denominator. CONTRAST:  53mL OMNIPAQUE IOHEXOL 350 MG/ML SOLN COMPARISON:  Plain head CT 1348 hours today. FINDINGS: CTA NECK Skeleton: No acute osseous abnormality identified. Upper chest: Negative. Other neck: Negative. Aortic arch: 3 vessel arch configuration. Minimal arch atherosclerosis. Right carotid system: Negative. Left carotid system: Negative. Vertebral arteries: Normal proximal subclavian arteries and vertebral artery origins. Both vertebral arteries are tortuous to the skull base, but there is no plaque or stenosis. The left vertebral appears mildly dominant. CTA HEAD  Posterior circulation: Mildly dominant left V4. No distal vertebral plaque or stenosis. Normal PICA origins and vertebrobasilar junction. Patent basilar artery without stenosis. Normal SCA and fetal type bilateral PCA origins. Bilateral PCAs are within normal limits. Anterior circulation: Both ICA siphons are patent. The right siphon is patent without plaque or stenosis. Normal right posterior communicating artery origin. 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 See series 8, image 108, series 7, image 113 and series 9, image 103. This measures up to 5 mm diameter. The left posterior communicating seems to arise from a medial wall of the aneurysm. Patent carotid termini. Normal MCA and ACA origins. Diminutive or absent anterior communicating  artery. Bilateral ACA branches are within normal limits. Right MCA M1 segment and bifurcation are patent without stenosis. Right MCA branches are within normal limits. Left MCA M1 segment and trifurcation are patent without stenosis. And no left MCA branch occlusion is identified. Left MCA branches appear within normal limits. Venous sinuses: Early contrast timing, but the major dural venous sinuses appear patent. Anatomic variants: Fetal type bilateral PCA origins and mildly dominant left vertebral artery. Review of the MIP images confirms the above findings IMPRESSION: 1. 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 2. No superimposed atherosclerosis or stenosis identified in the head or neck. These results were communicated to Dr. Rory Percy at 2:14 pmon 4/24/2021by text page via the [facility] messaging system. Electronically Signed   By: Genevie Ann M.D.   On: 05/07/2019 14:15   EEG adult  Result Date:  05/10/2019 Marcial Pacas, MD     05/13/2019 11:12 AM HISTORY: 59 year old female, presented with episode of sudden onset lower extremity weakness, TECHNIQUE: This is a routine 16 channel EEG recording with one channel devoted to a limited EKG recording.  It was performed during wakefulness, drowsiness and asleep.  Hyperventilation and photic stimulation were performed as activating procedures.  There are minimum muscle and movement artifact noted. Upon maximum arousal, posterior dominant waking rhythm consistent of rhythmic alpha range activity. Activities are symmetric over the bilateral posterior derivations and attenuated with eye opening. Hyperventilation produced mild/moderate buildup with higher amplitude and the slower activities noted. Photic stimulation did not alter the tracing. During EEG recording, patient developed drowsiness and entered sleep, sleep EEG demonstrated architecture, there were frontal centrally dominant vertex waves and symmetric sleep spindles noted. During EEG recording, there was no epileptiform discharge noted. EKG demonstrate sinus rhythm, with heart rate of 80 bpm CONCLUSION: This is a  normal wake and sleep EEG.  There is no electrodiagnostic evidence of epileptiform discharge. Marcial Pacas, M.D. Ph.D. [facility] [place], [place] [address] Phone: [phone] Fax:      [phone]  CT HEAD CODE STROKE WO CONTRAST  Result Date: 05/07/2019 CLINICAL DATA:  Code stroke. 59 year old female with aphasia last seen normal 1305 hours. EXAM: CT HEAD WITHOUT CONTRAST TECHNIQUE: Contiguous axial images were obtained from the base of the skull through the vertex without intravenous contrast. COMPARISON:  Head CT 08/01/2013. Brain MRI 03/23/2019. FINDINGS: Brain: Stable and normal cerebral volume. Mild motion artifact. Chronic hypodensity anterior to the right frontal horn is stable since 2015. Mild contralateral left periventricular white matter hypodensity. No  midline shift, ventriculomegaly, mass effect, evidence of mass lesion, intracranial hemorrhage or evidence of cortically based acute infarction. No cortical encephalomalacia identified. Vascular: No suspicious intracranial vascular hyperdensity, the left M1 appears stable by CT since 2015. Skull: Stable, negative. Sinuses/Orbits: Visualized paranasal sinuses and mastoids are stable and well pneumatized. Other: No acute orbit or scalp soft tissue finding. ASPECTS [facility] Stroke Program Early CT Score) Total score (0-10 with 10 being normal): 10 IMPRESSION: 1. Stable non contrast CT appearance of the brain since 2015, with mild chronic periventricular white matter changes. 2. ASPECTS 10. 3. These results were communicated to Dr. Rory Percy at [place] 4/24/2021by text page via the [facility] messaging system. Electronically Signed   By: Genevie Ann M.D.   On: 05/07/2019 13:56    Labs:  CBC: Recent Labs    05/07/19 1344 05/07/19 1351  WBC 5.3  --   HGB 12.8 13.6  HCT 40.0 40.0  PLT 294  --     COAGS: Recent Labs    05/07/19 1344  INR 0.9  APTT 26    BMP: Recent Labs    05/07/19 1344 05/07/19 1351  NA 141 141  K 3.4* 3.1*  CL 102 102  CO2 25  --   GLUCOSE 92 82  BUN 12 13  CALCIUM 11.0*  --   CREATININE 0.70 0.70  GFRNONAA >60  --   GFRAA >60  --     LIVER FUNCTION TESTS: Recent Labs    05/07/19 1344  BILITOT 0.5  AST 31  ALT 29  ALKPHOS 76  PROT 8.4*  ALBUMIN 5.1*    TUMOR MARKERS: No results for input(s): AFPTM, CEA, CA199, CHROMGRNA in the last 8760 hours.  Assessment and Plan: Left PCA 80mm saccular aneurysm Tammy Rodgers is a 59 year old female with pertinent past medical history of complex partial seizure disorder stable on lamictal, DM, HTN, and HLD.  She presents today after incidental finding of a 33mm saccular left PCA aneurysm identified during ED work-up for stroke-like symptoms.  Her symptoms of word finding difficulties, and difficulty walking resolved in the ED  and have never recurred.  Discussion held told between Dr. Estanislado Pandy, patient, and her daughter (via telephone) regarding the management and treatment of her moderate-sized aneurysm.  Discussed the nature of aneurysms including the risk of rupture as well as explained that there are two management options moving forward- either monitoring with routine image scans or with a cerebral angiogram with intervention. Both options were discussed in detail, including risks and benefits.Informed patient that if we are to move forward with intervention, she will need to take Plavix 75 mg once daily and Aspirin 81 mg once daily for 7 days prior to procedure. Patient expressed desire to move forward however she plans to discuss with her family before scheduling.  She does work as a Education officer, museum and has plans for travel in mid-June.  She will consider impact on her work and plans with respect to scheduling as she understands she will need to be admitted overnight if aneurysm is ultimately treated and she will have to remain out of work for approximately 2 weeks if all goes well.   Thank you for this interesting consult.  I greatly enjoyed meeting Tammy Rodgers and look forward to participating in their care.  A copy of this report was sent to the requesting provider on this date.  Electronically Signed: Docia Barrier, PA 05/19/2019, 12:03 PM   I spent a total of  30 Minutes   in face to face in clinical consultation, greater than 50% of which was counseling/coordinating care for L PCA aneurysm.

## 2019-05-20 ENCOUNTER — Other Ambulatory Visit (HOSPITAL_COMMUNITY): Payer: Self-pay | Admitting: Interventional Radiology

## 2019-05-20 DIAGNOSIS — I671 Cerebral aneurysm, nonruptured: Secondary | ICD-10-CM

## 2019-05-23 ENCOUNTER — Telehealth: Payer: Self-pay | Admitting: Student

## 2019-05-23 NOTE — Telephone Encounter (Signed)
NIR.  Faxed prescription medications to [facility] [phone]) at 0950 in preparation for IR procedure scheduled 6/31/2021:  1- Prednisone 50 mg tablets; take one tablet by mouth 13 hours, 7 hours, and 1 hour prior to procedure on 07/06/2019; dispense 3 tablets with 0 refills. 2- Benadryl 50 mg tablets; take one tablet by mouth 1 hour prior to procedure on 07/06/2019; dispense 1 tablet with 0 refills. 3- Plavix 75 mg tablets; take one tablet by mouth once daily; dispense 30 tablets with 3 refills.   Bea Graff Chabely Norby, PA-C 05/23/2019, 10:06 AM

## 2019-05-24 NOTE — Progress Notes (Signed)
I have reviewed and agreed above plan. 

## 2019-06-01 ENCOUNTER — Ambulatory Visit: Admitting: Neurology

## 2019-06-06 ENCOUNTER — Other Ambulatory Visit

## 2019-06-07 ENCOUNTER — Other Ambulatory Visit: Payer: Self-pay | Admitting: Neurology

## 2019-06-07 ENCOUNTER — Encounter: Payer: Self-pay | Admitting: *Deleted

## 2019-06-29 ENCOUNTER — Encounter (HOSPITAL_COMMUNITY): Payer: Self-pay

## 2019-07-04 ENCOUNTER — Other Ambulatory Visit (HOSPITAL_COMMUNITY): Payer: Self-pay

## 2019-07-04 ENCOUNTER — Other Ambulatory Visit (HOSPITAL_COMMUNITY)
Admission: RE | Admit: 2019-07-04 | Discharge: 2019-07-04 | Disposition: A | Discharge status: Home / Self Care | Source: Ambulatory Visit | Attending: Interventional Radiology | Admitting: Interventional Radiology

## 2019-07-04 DIAGNOSIS — Z20822 Contact with and (suspected) exposure to covid-19: Secondary | ICD-10-CM | POA: Diagnosis not present

## 2019-07-04 DIAGNOSIS — Z01812 Encounter for preprocedural laboratory examination: Secondary | ICD-10-CM | POA: Diagnosis present

## 2019-07-04 DIAGNOSIS — I671 Cerebral aneurysm, nonruptured: Secondary | ICD-10-CM

## 2019-07-04 LAB — SARS CORONAVIRUS 2 (TAT 6-24 HRS): SARS Coronavirus 2: NEGATIVE

## 2019-07-04 LAB — PLATELET INHIBITION P2Y12: Platelet Function  P2Y12: 92 [PRU] — ABNORMAL LOW (ref 182–335)

## 2019-07-05 ENCOUNTER — Encounter (HOSPITAL_COMMUNITY): Payer: Self-pay | Admitting: Interventional Radiology

## 2019-07-05 ENCOUNTER — Other Ambulatory Visit: Payer: Self-pay

## 2019-07-05 ENCOUNTER — Telehealth (HOSPITAL_COMMUNITY): Payer: Self-pay | Admitting: *Deleted

## 2019-07-05 ENCOUNTER — Other Ambulatory Visit: Payer: Self-pay | Admitting: Radiology

## 2019-07-05 NOTE — Progress Notes (Signed)
Spoke with pt for pre-op call. Pt denies cardiac history, but is treated for HTN, Seizures and Pre-diabetes. Pt's last A1C was 5.8 two months ago per pt. She states she does not check her blood sugar at home. Instructed pt not to take her Metformin in the AM.   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   Covid test done 07/04/19 and it's negative. She states she's been in quarantine since the test was done and understands she stays in quarantine until she comes to the hospital.

## 2019-07-06 ENCOUNTER — Ambulatory Visit (HOSPITAL_COMMUNITY): Admitting: Certified Registered Nurse Anesthetist

## 2019-07-06 ENCOUNTER — Observation Stay (HOSPITAL_COMMUNITY)
Admission: RE | Admit: 2019-07-06 | Discharge: 2019-07-07 | Disposition: A | Discharge status: Home / Self Care | Attending: Interventional Radiology | Admitting: Interventional Radiology

## 2019-07-06 ENCOUNTER — Encounter (HOSPITAL_COMMUNITY)
Admission: RE | Disposition: A | Discharge status: Home / Self Care | Payer: Self-pay | Source: Home / Self Care | Attending: Interventional Radiology

## 2019-07-06 ENCOUNTER — Other Ambulatory Visit: Payer: Self-pay

## 2019-07-06 ENCOUNTER — Encounter (HOSPITAL_COMMUNITY): Payer: Self-pay | Admitting: Interventional Radiology

## 2019-07-06 ENCOUNTER — Ambulatory Visit (HOSPITAL_COMMUNITY)
Admission: RE | Admit: 2019-07-06 | Discharge: 2019-07-06 | Disposition: A | Discharge status: Home / Self Care | Source: Ambulatory Visit | Attending: Interventional Radiology | Admitting: Interventional Radiology

## 2019-07-06 DIAGNOSIS — D649 Anemia, unspecified: Secondary | ICD-10-CM | POA: Insufficient documentation

## 2019-07-06 DIAGNOSIS — G40209 Localization-related (focal) (partial) symptomatic epilepsy and epileptic syndromes with complex partial seizures, not intractable, without status epilepticus: Secondary | ICD-10-CM | POA: Diagnosis not present

## 2019-07-06 DIAGNOSIS — Z7902 Long term (current) use of antithrombotics/antiplatelets: Secondary | ICD-10-CM | POA: Insufficient documentation

## 2019-07-06 DIAGNOSIS — R7303 Prediabetes: Secondary | ICD-10-CM | POA: Insufficient documentation

## 2019-07-06 DIAGNOSIS — K219 Gastro-esophageal reflux disease without esophagitis: Secondary | ICD-10-CM | POA: Insufficient documentation

## 2019-07-06 DIAGNOSIS — Z79899 Other long term (current) drug therapy: Secondary | ICD-10-CM | POA: Insufficient documentation

## 2019-07-06 DIAGNOSIS — I671 Cerebral aneurysm, nonruptured: Secondary | ICD-10-CM | POA: Insufficient documentation

## 2019-07-06 DIAGNOSIS — E119 Type 2 diabetes mellitus without complications: Secondary | ICD-10-CM | POA: Diagnosis not present

## 2019-07-06 DIAGNOSIS — F419 Anxiety disorder, unspecified: Secondary | ICD-10-CM | POA: Insufficient documentation

## 2019-07-06 DIAGNOSIS — Z7982 Long term (current) use of aspirin: Secondary | ICD-10-CM | POA: Insufficient documentation

## 2019-07-06 DIAGNOSIS — E7800 Pure hypercholesterolemia, unspecified: Secondary | ICD-10-CM | POA: Insufficient documentation

## 2019-07-06 DIAGNOSIS — G4730 Sleep apnea, unspecified: Secondary | ICD-10-CM | POA: Insufficient documentation

## 2019-07-06 DIAGNOSIS — Z7984 Long term (current) use of oral hypoglycemic drugs: Secondary | ICD-10-CM | POA: Diagnosis not present

## 2019-07-06 DIAGNOSIS — I10 Essential (primary) hypertension: Secondary | ICD-10-CM | POA: Insufficient documentation

## 2019-07-06 DIAGNOSIS — E785 Hyperlipidemia, unspecified: Secondary | ICD-10-CM | POA: Diagnosis not present

## 2019-07-06 DIAGNOSIS — R569 Unspecified convulsions: Secondary | ICD-10-CM | POA: Insufficient documentation

## 2019-07-06 HISTORY — PX: IR TRANSCATH/EMBOLIZ: IMG695

## 2019-07-06 HISTORY — PX: IR ANGIOGRAM FOLLOW UP STUDY: IMG697

## 2019-07-06 HISTORY — DX: Anxiety disorder, unspecified: F41.9

## 2019-07-06 HISTORY — PX: IR ANGIO VERTEBRAL SEL VERTEBRAL UNI L MOD SED: IMG5367

## 2019-07-06 HISTORY — DX: Gastro-esophageal reflux disease without esophagitis: K21.9

## 2019-07-06 HISTORY — PX: RADIOLOGY WITH ANESTHESIA: SHX6223

## 2019-07-06 HISTORY — PX: IR ANGIO INTRA EXTRACRAN SEL COM CAROTID INNOMINATE UNI R MOD SED: IMG5359

## 2019-07-06 HISTORY — DX: Anemia, unspecified: D64.9

## 2019-07-06 HISTORY — PX: IR ANGIO INTRA EXTRACRAN SEL INTERNAL CAROTID UNI L MOD SED: IMG5361

## 2019-07-06 HISTORY — PX: IR US GUIDE VASC ACCESS RIGHT: IMG2390

## 2019-07-06 LAB — CBC WITH DIFFERENTIAL/PLATELET
Abs Immature Granulocytes: 0.01 10*3/uL (ref 0.00–0.07)
Basophils Absolute: 0 10*3/uL (ref 0.0–0.1)
Basophils Relative: 0 %
Eosinophils Absolute: 0 10*3/uL (ref 0.0–0.5)
Eosinophils Relative: 0 %
HCT: 35.5 % — ABNORMAL LOW (ref 36.0–46.0)
Hemoglobin: 12 g/dL (ref 12.0–15.0)
Immature Granulocytes: 0 %
Lymphocytes Relative: 12 %
Lymphs Abs: 0.5 10*3/uL — ABNORMAL LOW (ref 0.7–4.0)
MCH: 31.1 pg (ref 26.0–34.0)
MCHC: 33.8 g/dL (ref 30.0–36.0)
MCV: 92 fL (ref 80.0–100.0)
Monocytes Absolute: 0 10*3/uL — ABNORMAL LOW (ref 0.1–1.0)
Monocytes Relative: 1 %
Neutro Abs: 3.5 10*3/uL (ref 1.7–7.7)
Neutrophils Relative %: 87 %
Platelets: 276 10*3/uL (ref 150–400)
RBC: 3.86 MIL/uL — ABNORMAL LOW (ref 3.87–5.11)
RDW: 14.2 % (ref 11.5–15.5)
WBC: 4 10*3/uL (ref 4.0–10.5)
nRBC: 0 % (ref 0.0–0.2)

## 2019-07-06 LAB — BASIC METABOLIC PANEL
Anion gap: 14 (ref 5–15)
BUN: 18 mg/dL (ref 6–20)
CO2: 22 mmol/L (ref 22–32)
Calcium: 10.4 mg/dL — ABNORMAL HIGH (ref 8.9–10.3)
Chloride: 106 mmol/L (ref 98–111)
Creatinine, Ser: 0.8 mg/dL (ref 0.44–1.00)
GFR calc Af Amer: >60 mL/min (ref >60)
GFR calc non Af Amer: >60 mL/min (ref >60)
Glucose, Bld: 140 mg/dL — ABNORMAL HIGH (ref 70–99)
Potassium: 3.7 mmol/L (ref 3.5–5.1)
Sodium: 142 mmol/L (ref 135–145)

## 2019-07-06 LAB — GLUCOSE, CAPILLARY: Glucose-Capillary: 125 mg/dL — ABNORMAL HIGH (ref 70–99)

## 2019-07-06 LAB — POCT ACTIVATED CLOTTING TIME
Activated Clotting Time: 175 seconds
Activated Clotting Time: 197 seconds
Activated Clotting Time: 208 seconds

## 2019-07-06 LAB — PLATELET INHIBITION P2Y12: Platelet Function  P2Y12: 86 [PRU] — ABNORMAL LOW (ref 182–335)

## 2019-07-06 LAB — PROTIME-INR
INR: 1 (ref 0.8–1.2)
Prothrombin Time: 13 seconds (ref 11.4–15.2)

## 2019-07-06 IMAGING — XA IR TRANSCATH EMBOLIZATION
1 of 2 series · 9 of 24 positions shown · IV contrast (IODINE)
Comparison: CT angiogram of the head and neck [DATE].

CLINICAL DATA: Patient with history of ischemic stroke. Workup
revealed presence of a lobulated aneurysm arising from the left
internal carotid artery at the level of the posterior communicating
artery.

EXAM:
TRANSCATHETER THERAPY EMBOLIZATION
TECHNIQUE: Informed written consent was obtained from the patient after a
thorough discussion of the procedural risks, benefits and
alternatives. All questions were addressed. Maximal Sterile Barrier
Technique was utilized including caps, mask, sterile gowns, sterile
gloves, sterile drape, hand hygiene and skin antiseptic. A timeout
was performed prior to the initiation of the procedure.

[Series 300: dr. (person_name) · 9 of 295 slices shown]
[im 1/295]
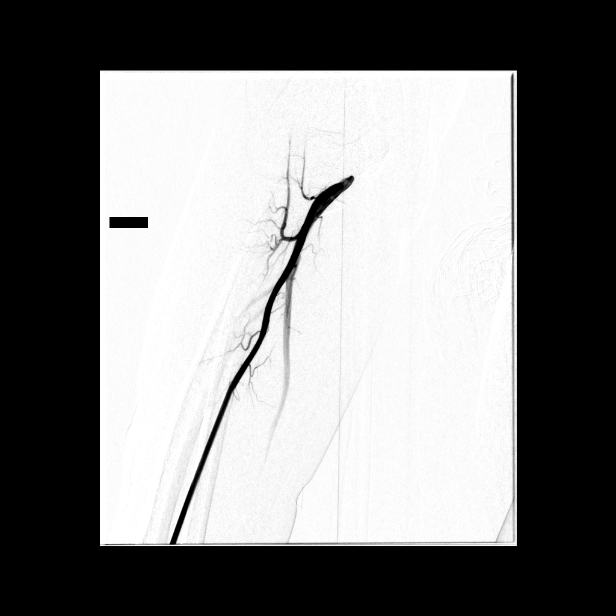
[im 41/295]
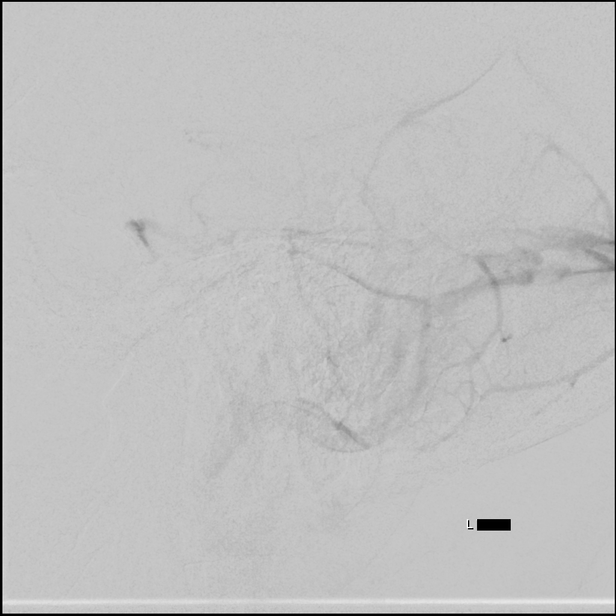
[im 67/295]
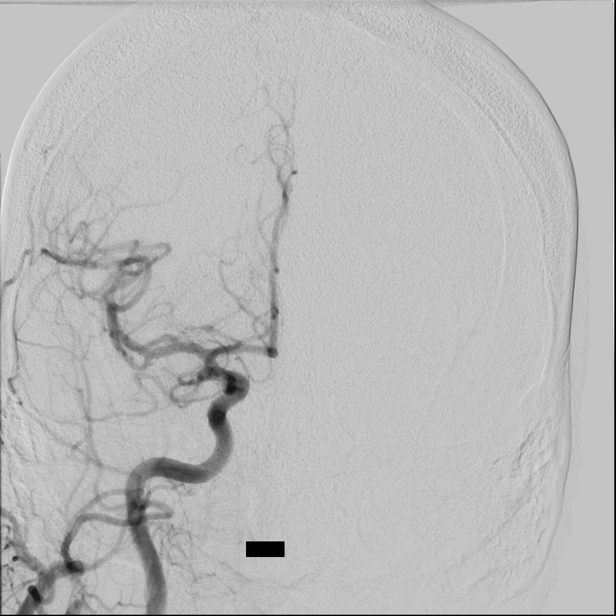
[im 107/295]
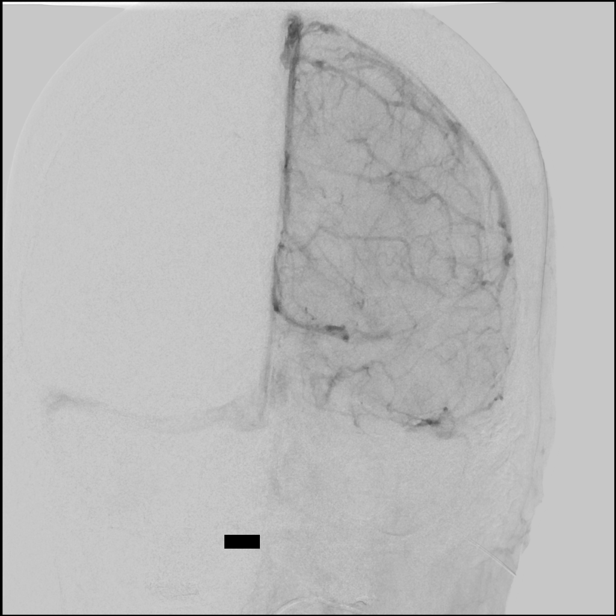
[im 148/295]
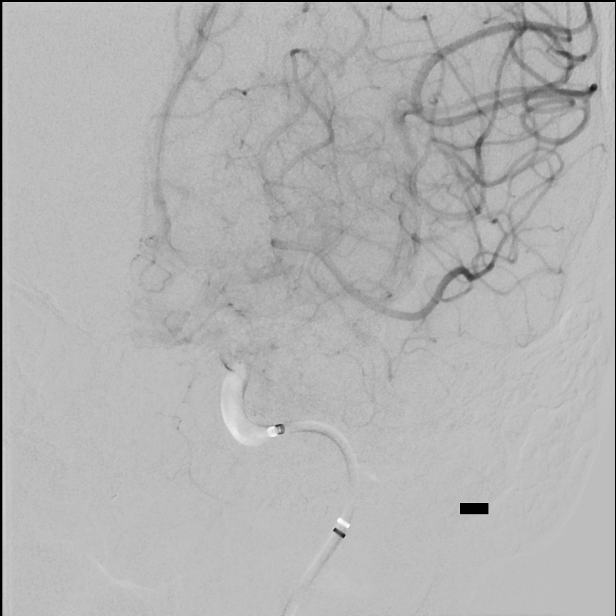
[im 174/295]
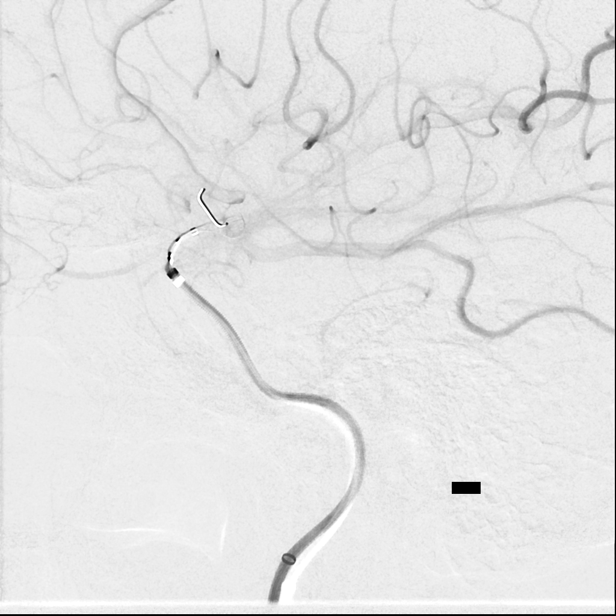
[im 214/295]
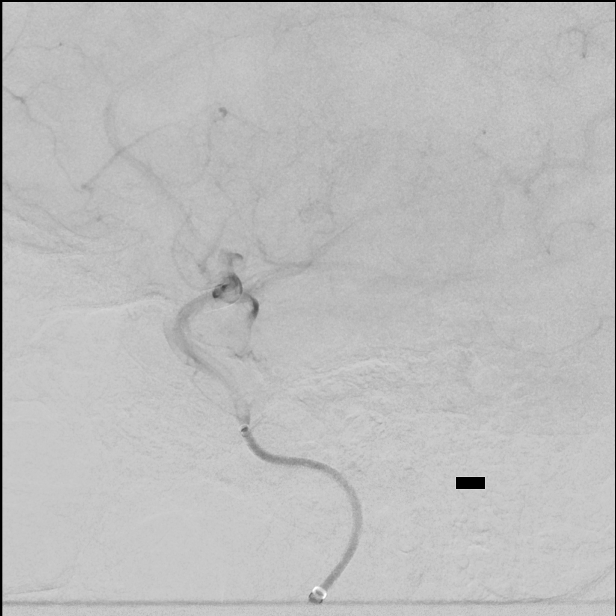
[im 254/295]
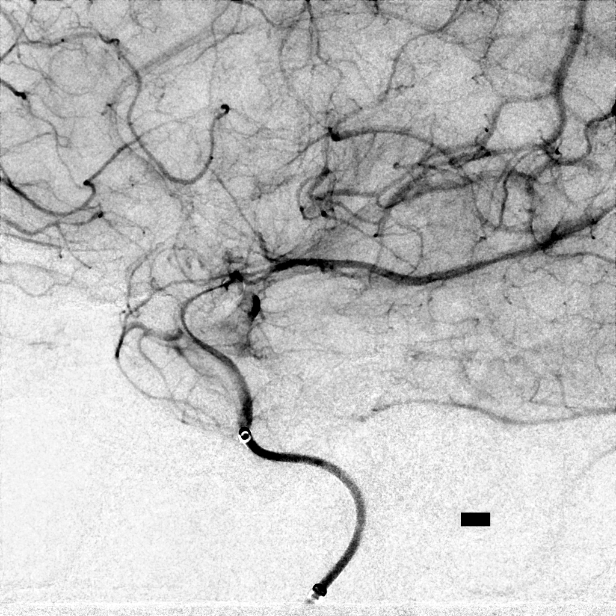
[im 281/295]
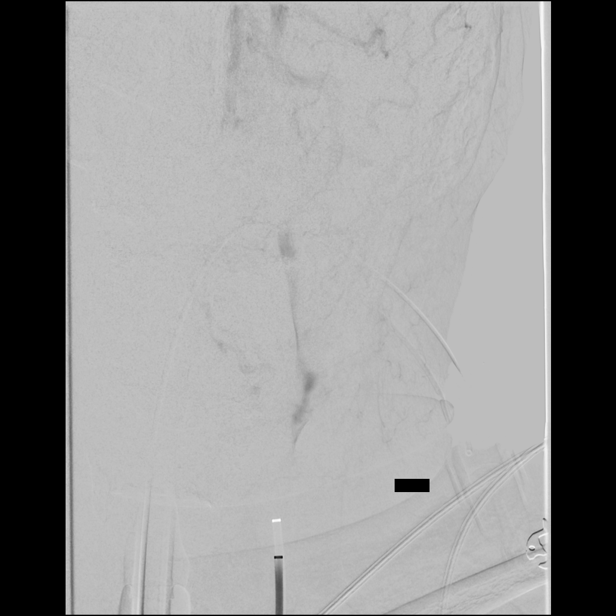

[9 of 24 positions shown; findings below may reference images not displayed]

MEDICATIONS:
Heparin 4,000 units IV. Ancef 2 g IV antibiotic was administered
within 1 hour of the procedure.

ANESTHESIA/SEDATION:
General anesthesia

CONTRAST:  Isovue 300 approximately 75 mL

FLUOROSCOPY TIME:  Fluoroscopy Time: 47 minutes 30 seconds ([7C]
mGy).

COMPLICATIONS:
None immediate.
The right forearm to the wrist was prepped and draped in the usual
sterile manner. The right radial artery was then identified and its
morphology documented with ultrasound examination.

A dorsal palmar anastomosis was verified to be present. Using
ultrasound guidance and a micropuncture set access into the right
radial artery was obtained without difficulty. Over a 0.018 inch
micro guidewire, a [DATE] French radial sheath was then inserted. The
obturator and the micro guidewire were removed. Good aspiration was
obtained from the side port of the sheath. A cocktail of [7C] units
of heparin, 2.5 mg of verapamil and 200 mcg of nitroglycerin was
then infused in diluted form without event. A right radial
arteriogram was obtained.

Over a 0.035 inch Roadrunner guidewire, a 5 MONTECODE 2
diagnostic catheter was advanced to the aortic arch region, and
cannulations were performed of the left vertebral artery, the right
common artery and the left common carotid artery. Arteriograms were
obtained in these vessels. Also obtained was a 3D rotational
arteriogram intracranially from the left common carotid artery. 3D
reconstruction was then performed of the left anterior circulation.
FINDINGS: The left vertebral artery origin is widely patent.

The vessel is seen to opacify to the cranial skull base. Wide
patency is seen of the left posterior-inferior cerebellar artery and
the left vertebrobasilar junction.

The opacified portion of the basilar artery, the posterior cerebral
arteries, the superior cerebellar arteries and the anterior-inferior
cerebellar arteries is noted into the capillary and venous phases.
Unopacified blood is seen in the basilar artery from the
contralateral vertebral artery.

The innominate arteriogram demonstrates the origin of the right
subclavian artery and the right common carotid artery to be widely
patent.

The origin of the right vertebral artery is widely patent. The
vessel is seen to opacify to the cranial skull base.

The right common carotid arteriogram demonstrates the right external
carotid artery and its major branches to be widely patent.

The right internal carotid artery at the bulb to the cranial skull
base is widely patent.

The petrous, cavernous and supraclinoid segments are widely patent.

A right posterior communicating artery is seen opacifying the right
posterior cerebral artery distribution.

The right middle cerebral artery and the right anterior cerebral
artery opacify into the capillary and venous phases.

The left common carotid arteriogram demonstrates the left external
carotid artery and its major branches to be widely patent.

The left internal carotid artery at the bulb to the cranial skull
base also demonstrates wide patency. The petrous, the cavernous and
the supraclinoid segments are widely patent.

A left posterior communicating artery is seen opacifying the left
posterior cerebral artery territory.

Arising in the region of the left posterior communicating artery
origin is a bilobed saccular aneurysm measuring approximately 5.2 mm
x 4.2 mm with a relatively wide neck. The 3D reconstruction images
demonstrate the aneurysm to arise from the upper most proximal
aspect of the left posterior communicating artery, just at its
origin from the internal carotid artery.

The left middle cerebral artery and the left anterior cerebral
artery opacify into the capillary and venous phases.

ENDOVASCULAR EMBOLIZATION OF WIDE NECK LEFT INTERNAL CAROTID ARTERY,
POSTERIOR COMMUNICATING ARTERY REGION ANEURYSM.

The diagnostic 5 MONTECODE 2 catheter in the left common
carotid artery was then exchanged over a 0.035 inch 300 cm Rosen
exchange guidewire for a 95 cm 7 French [REDACTED] sheath. Guidewire was
removed. Good aspiration obtained from the 7 French [REDACTED] sheath. A
gentle control arteriogram performed through this demonstrated no
evidence of spasms, dissections or of intraluminal filling defects.
Over a 0.035 inch Roadrunner guidewire, a 5 French 115 cm Catalyst
guide catheter was then advanced to the horizontal petrous segment
of the left internal carotid artery, with the advancement of the 7
French sheath to the cervical petrous junction.

The guidewire was removed. Good aspiration obtained from the hub of
the 5 French Catalyst guide catheter in the left internal carotid
artery. Control arteriogram demonstrated no evidence of intraluminal
filling defects or of occlusions.

Following measurements of the left internal carotid artery at the
terminus, and the distal cavernous segment of the left internal
carotid artery of the landing zones, it was decided to use a 3.5 mm
x 12 mm pipeline flex flow diverter device for embolization
treatment. Over a 0.014 inch standard Synchro micro guidewire with a
J-tip configuration, a 150 cm 027 Phenom microcatheter was advanced
to the distal end of the Catalyst guide catheter.

With the micro guidewire leading with a J-tip configuration, the
combination was navigated without difficulty with a torque device to
the M2 M3 region of the inferior division of the left middle
cerebral artery.

The guidewire was removed. Good aspiration obtained from the Phenom
microcatheter. Gentle control arteriogram performed through this
demonstrated safe position of tip of the microcatheter which was
then connected to continuous heparinized saline infusion.

The housing of the 3.5 mm x 12 mm device was then purged with
heparinized saline infusion.

Under intermittent fluoroscopy, this was then advanced to the distal
end of the microcatheter. The O ring on the delivery microcatheter
was loosened. With slight forward gentle traction with the right
hand on the delivery micro guidewire, with the left hand the
delivery microcatheter was unsheathed unsheathing the distal wire,
and also the distal few mm of the device.

The cigar configuration changed through expansion of the device
distally.

The combination was then retrieved to the distal landing zone which
was at the left internal carotid artery terminus. Thereafter, the
micro guidewire was then advanced unsheathing the device distally.
More of the device was then deployed in the manner described
advancing the micro guidewire, and maintaining the microcatheter
centralized in the native lumen.

Once the entire device have been deployed, the microcatheter was
gently advanced through the device and then retrieved.

Control arteriogram performed through the Catalyst guide catheter
demonstrated excellent apposition and deployment of the device with
stasis being seen in the aneurysm itself.

Under constant fluoroscopic guidance, the delivery micro guidewire
and the catheter were retrieved and removed. A control arteriogram
performed through the 5 French Catalyst guide catheter at 15 and 30
minutes post deployment continued to demonstrate excellent flow
through the device with partial stasis in the aneurysm itself.
Patency of the left posterior communicating artery was maintained.

A final control arteriogram performed through the [REDACTED] sheath
demonstrated excellent flow through the internal carotid artery and
the through the device. No evidence of intraluminal filling defects
or of occlusion was seen.

The left internal carotid artery extra cranially remained widely
patent also.

The 7 French long sheath was then retrieved and removed.

The [DATE] radial sheath was then removed with the successful
application of a wrist band for hemostasis in the right radial
artery puncture site. Distal right radial pulse was verified to be
present.

Prior to removal, the patient was also given a total of 4.5 mg of
Integrilin intra-arterially to ensure no platelet aggregation in the
device itself.

The patient's general anesthesia was then reversed and the patient
was then extubated without difficulty. Upon recovery, the patient
complained of a sore throat but, otherwise, remained oriented with
normal speech without any motor abnormalities.

She was then transferred to the PACU and then to the neuro ICU to
continue on low-dose IV heparin, close monitoring of her blood
pressure, and close neurological checks. Overnight the patient
exhibited no complications. The following morning, the IV heparin
was stopped and the patient was switched to aspirin 325 mg a day,
and Plavix 75 mg a day. The right wrist puncture site remained soft
with the right radial artery pulse being present.

She was then mobilized independently and able to tolerate overall
intake. She was then discharged home under the care of family with
special instructions to maintain adequate hydration, to take her
medications without delay.

She was advised to refrain from driving for 2 weeks, avoid stooping,
bending or lifting weights above 10 pounds.

She was advised also to maintain adequate hydration by drinking
water.

Should she develop any symptoms of gait instability, motor symptoms,
speech difficulties or vision symptoms she was advised to call 911.

She expressed understanding and agreement with the above management
plan.
IMPRESSION: Status post endovascular embolization of lobulated left internal
carotid artery posterior communicating artery aneurysm using a
mm x 12 mm pipeline flex flow diverter device.

PLAN:
Follow-up in the clinic 2 weeks post discharge.

## 2019-07-06 SURGERY — IR WITH ANESTHESIA
Anesthesia: General

## 2019-07-06 MED ORDER — SODIUM CHLORIDE 0.9 % IV SOLN: INTRAVENOUS | Status: DC

## 2019-07-06 MED ORDER — LOSARTAN POTASSIUM-HCTZ 50-12.5 MG PO TABS: 1.0000 | ORAL_TABLET | Freq: Every day | ORAL | Status: DC

## 2019-07-06 MED ORDER — VERAPAMIL HCL 2.5 MG/ML IV SOLN
INTRAVENOUS | Status: AC
  Filled 2019-07-06: qty 2

## 2019-07-06 MED ORDER — CLEVIDIPINE BUTYRATE 0.5 MG/ML IV EMUL
INTRAVENOUS | Status: AC
  Filled 2019-07-06: qty 50

## 2019-07-06 MED ORDER — VERAPAMIL HCL 2.5 MG/ML IV SOLN: INTRA_ARTERIAL | Status: AC | PRN

## 2019-07-06 MED ORDER — ACETAMINOPHEN 160 MG/5ML PO SOLN: 650.0000 mg | ORAL | Status: DC | PRN

## 2019-07-06 MED ORDER — HEPARIN (PORCINE) 25000 UT/250ML-% IV SOLN
INTRAVENOUS | Status: AC
  Filled 2019-07-06: qty 250

## 2019-07-06 MED ORDER — HEPARIN SODIUM (PORCINE) 1000 UNIT/ML IJ SOLN
INTRAMUSCULAR | Status: DC | PRN
  Administered 2019-07-06: 1000 [IU] via INTRAVENOUS
  Administered 2019-07-06 (×2): 500 [IU] via INTRAVENOUS

## 2019-07-06 MED ORDER — CLOPIDOGREL BISULFATE 75 MG PO TABS
75.0000 mg | ORAL_TABLET | ORAL | Status: DC
  Filled 2019-07-06: qty 1

## 2019-07-06 MED ORDER — EPHEDRINE SULFATE-NACL 50-0.9 MG/10ML-% IV SOSY
PREFILLED_SYRINGE | INTRAVENOUS | Status: DC | PRN
  Administered 2019-07-06: 10 mg via INTRAVENOUS

## 2019-07-06 MED ORDER — LIDOCAINE HCL 1 % IJ SOLN
INTRAMUSCULAR | Status: AC
  Filled 2019-07-06: qty 20

## 2019-07-06 MED ORDER — ASPIRIN 81 MG PO CHEW: 244.0000 mg | CHEWABLE_TABLET | Freq: Once | ORAL | Status: DC

## 2019-07-06 MED ORDER — EPTIFIBATIDE 20 MG/10ML IV SOLN
INTRAVENOUS | Status: AC
  Filled 2019-07-06: qty 10

## 2019-07-06 MED ORDER — PROPOFOL 10 MG/ML IV BOLUS
INTRAVENOUS | Status: DC | PRN
  Administered 2019-07-06: 120 mg via INTRAVENOUS

## 2019-07-06 MED ORDER — PHENYLEPHRINE HCL-NACL 10-0.9 MG/250ML-% IV SOLN
INTRAVENOUS | Status: DC | PRN
  Administered 2019-07-06: 15 ug/min via INTRAVENOUS

## 2019-07-06 MED ORDER — LOSARTAN POTASSIUM 50 MG PO TABS
50.0000 mg | ORAL_TABLET | Freq: Every day | ORAL | Status: DC
  Administered 2019-07-07: 50 mg via ORAL
  Filled 2019-07-06: qty 1

## 2019-07-06 MED ORDER — CLOPIDOGREL BISULFATE 75 MG PO TABS
75.0000 mg | ORAL_TABLET | Freq: Every day | ORAL | Status: DC
  Administered 2019-07-07: 75 mg via ORAL
  Filled 2019-07-06: qty 1

## 2019-07-06 MED ORDER — ONDANSETRON HCL 4 MG/2ML IJ SOLN: 4.0000 mg | Freq: Four times a day (QID) | INTRAMUSCULAR | Status: DC | PRN

## 2019-07-06 MED ORDER — HEPARIN (PORCINE) 25000 UT/250ML-% IV SOLN
650.0000 [IU]/h | INTRAVENOUS | Status: DC
  Administered 2019-07-06: 650 [IU]/h via INTRAVENOUS
  Filled 2019-07-06: qty 250

## 2019-07-06 MED ORDER — ASPIRIN 325 MG PO TABS
325.0000 mg | ORAL_TABLET | Freq: Every day | ORAL | Status: DC
  Administered 2019-07-07: 325 mg via ORAL
  Filled 2019-07-06: qty 1

## 2019-07-06 MED ORDER — HYDROCHLOROTHIAZIDE 12.5 MG PO CAPS
12.5000 mg | ORAL_CAPSULE | Freq: Every day | ORAL | Status: DC
  Administered 2019-07-07: 12.5 mg via ORAL
  Filled 2019-07-06: qty 1

## 2019-07-06 MED ORDER — FENTANYL CITRATE (PF) 100 MCG/2ML IJ SOLN
25.0000 ug | INTRAMUSCULAR | Status: DC | PRN
Start: 2019-07-06 — End: 2019-07-06

## 2019-07-06 MED ORDER — NITROGLYCERIN 1 MG/10 ML FOR IR/CATH LAB
INTRA_ARTERIAL | Status: AC
  Filled 2019-07-06: qty 10

## 2019-07-06 MED ORDER — HEPARIN (PORCINE) 25000 UT/250ML-% IV SOLN
650.0000 [IU]/h | INTRAVENOUS | Status: DC
  Administered 2019-07-07: 650 [IU]/h via INTRAVENOUS
  Filled 2019-07-06: qty 250

## 2019-07-06 MED ORDER — LAMOTRIGINE 100 MG PO TABS
200.0000 mg | ORAL_TABLET | Freq: Every day | ORAL | Status: DC
  Administered 2019-07-07: 200 mg via ORAL
  Filled 2019-07-06: qty 2

## 2019-07-06 MED ORDER — ACETAMINOPHEN 650 MG RE SUPP: 650.0000 mg | RECTAL | Status: DC | PRN

## 2019-07-06 MED ORDER — HEPARIN (PORCINE) 25000 UT/250ML-% IV SOLN: 500.0000 [IU]/h | INTRAVENOUS | Status: DC

## 2019-07-06 MED ORDER — ACETAMINOPHEN 325 MG PO TABS
650.0000 mg | ORAL_TABLET | ORAL | Status: DC | PRN
  Administered 2019-07-06: 650 mg via ORAL
  Filled 2019-07-06: qty 2

## 2019-07-06 MED ORDER — HEPARIN SODIUM (PORCINE) 1000 UNIT/ML IJ SOLN
INTRAMUSCULAR | Status: AC
  Filled 2019-07-06: qty 1

## 2019-07-06 MED ORDER — ASPIRIN 81 MG PO CHEW
CHEWABLE_TABLET | ORAL | Status: AC
  Administered 2019-07-06: 243 mg via ORAL
  Filled 2019-07-06: qty 3

## 2019-07-06 MED ORDER — CHLORHEXIDINE GLUCONATE CLOTH 2 % EX PADS: 6.0000 | MEDICATED_PAD | Freq: Every day | CUTANEOUS | Status: DC

## 2019-07-06 MED ORDER — CLOPIDOGREL BISULFATE 75 MG PO TABS: 75.0000 mg | ORAL_TABLET | Freq: Every day | ORAL | Status: DC

## 2019-07-06 MED ORDER — PHENYLEPHRINE 40 MCG/ML (10ML) SYRINGE FOR IV PUSH (FOR BLOOD PRESSURE SUPPORT)
PREFILLED_SYRINGE | INTRAVENOUS | Status: DC | PRN
  Administered 2019-07-06: 120 ug via INTRAVENOUS
  Administered 2019-07-06: 80 ug via INTRAVENOUS
  Administered 2019-07-06: 120 ug via INTRAVENOUS

## 2019-07-06 MED ORDER — IOHEXOL 300 MG/ML  SOLN
150.0000 mL | Freq: Once | INTRAMUSCULAR | Status: AC | PRN
  Administered 2019-07-06: 60 mL via INTRA_ARTERIAL

## 2019-07-06 MED ORDER — CLEVIDIPINE BUTYRATE 0.5 MG/ML IV EMUL
0.0000 mg/h | INTRAVENOUS | Status: DC
  Administered 2019-07-06: 1 mg/h via INTRAVENOUS
  Administered 2019-07-06: 20 mg/h via INTRAVENOUS

## 2019-07-06 MED ORDER — OXYCODONE HCL 5 MG/5ML PO SOLN: 5.0000 mg | Freq: Once | ORAL | Status: DC | PRN

## 2019-07-06 MED ORDER — IOHEXOL 300 MG/ML  SOLN
100.0000 mL | Freq: Once | INTRAMUSCULAR | Status: AC | PRN
  Administered 2019-07-06: 21 mL via INTRA_ARTERIAL

## 2019-07-06 MED ORDER — FENTANYL CITRATE (PF) 250 MCG/5ML IJ SOLN
INTRAMUSCULAR | Status: AC
  Filled 2019-07-06: qty 5

## 2019-07-06 MED ORDER — PANTOPRAZOLE SODIUM 40 MG PO TBEC
40.0000 mg | DELAYED_RELEASE_TABLET | Freq: Every day | ORAL | Status: DC
  Administered 2019-07-06 – 2019-07-07 (×2): 40 mg via ORAL
  Filled 2019-07-06 (×2): qty 1

## 2019-07-06 MED ORDER — SUGAMMADEX SODIUM 200 MG/2ML IV SOLN
INTRAVENOUS | Status: DC | PRN
  Administered 2019-07-06: 400 mg via INTRAVENOUS

## 2019-07-06 MED ORDER — OXYCODONE HCL 5 MG PO TABS
5.0000 mg | ORAL_TABLET | Freq: Once | ORAL | Status: DC | PRN
Start: 2019-07-06 — End: 2019-07-06

## 2019-07-06 MED ORDER — CHLORHEXIDINE GLUCONATE 0.12 % MT SOLN: 15.0000 mL | Freq: Once | OROMUCOSAL | Status: AC

## 2019-07-06 MED ORDER — ASPIRIN 81 MG PO CHEW
243.0000 mg | CHEWABLE_TABLET | Freq: Once | ORAL | Status: AC
  Filled 2019-07-06: qty 3

## 2019-07-06 MED ORDER — LIDOCAINE 2% (20 MG/ML) 5 ML SYRINGE
INTRAMUSCULAR | Status: DC | PRN
  Administered 2019-07-06: 50 mg via INTRAVENOUS

## 2019-07-06 MED ORDER — CEFAZOLIN SODIUM-DEXTROSE 2-4 GM/100ML-% IV SOLN
2.0000 g | INTRAVENOUS | Status: AC
  Administered 2019-07-06: 2 g via INTRAVENOUS
  Filled 2019-07-06: qty 100

## 2019-07-06 MED ORDER — LAMOTRIGINE 100 MG PO TABS
300.0000 mg | ORAL_TABLET | Freq: Every day | ORAL | Status: DC
  Administered 2019-07-06: 300 mg via ORAL
  Filled 2019-07-06: qty 3

## 2019-07-06 MED ORDER — ASPIRIN 81 MG PO CHEW: 324.0000 mg | CHEWABLE_TABLET | Freq: Every day | ORAL | Status: DC

## 2019-07-06 MED ORDER — DEXAMETHASONE SODIUM PHOSPHATE 10 MG/ML IJ SOLN
INTRAMUSCULAR | Status: DC | PRN
  Administered 2019-07-06: 4 mg via INTRAVENOUS

## 2019-07-06 MED ORDER — EPTIFIBATIDE 20 MG/10ML IV SOLN
INTRAVENOUS | Status: AC | PRN
  Administered 2019-07-06 (×2): 1.5 mg via INTRAVENOUS

## 2019-07-06 MED ORDER — ROCURONIUM BROMIDE 10 MG/ML (PF) SYRINGE
PREFILLED_SYRINGE | INTRAVENOUS | Status: DC | PRN
  Administered 2019-07-06: 50 mg via INTRAVENOUS
  Administered 2019-07-06: 20 mg via INTRAVENOUS

## 2019-07-06 MED ORDER — AMLODIPINE BESYLATE 10 MG PO TABS
10.0000 mg | ORAL_TABLET | Freq: Every day | ORAL | Status: DC
  Administered 2019-07-07: 10 mg via ORAL
  Filled 2019-07-06: qty 1

## 2019-07-06 MED ORDER — FENTANYL CITRATE (PF) 250 MCG/5ML IJ SOLN
INTRAMUSCULAR | Status: DC | PRN
  Administered 2019-07-06: 50 ug via INTRAVENOUS
  Administered 2019-07-06: 100 ug via INTRAVENOUS

## 2019-07-06 MED ORDER — ASPIRIN EC 325 MG PO TBEC
325.0000 mg | DELAYED_RELEASE_TABLET | ORAL | Status: DC
  Filled 2019-07-06: qty 1

## 2019-07-06 MED ORDER — NIMODIPINE 30 MG PO CAPS: 0.0000 mg | ORAL_CAPSULE | ORAL | Status: DC

## 2019-07-06 MED ORDER — ORAL CARE MOUTH RINSE
15.0000 mL | Freq: Once | OROMUCOSAL | Status: AC
  Administered 2019-07-06: 15 mL via OROMUCOSAL

## 2019-07-06 NOTE — Transfer of Care (Signed)
Immediate Anesthesia Transfer of Care Note  Patient: Tammy Rodgers  Procedure(s) Performed: IR WITH ANESTHESIA EMBLOLIZATION (N/A )  Patient Location: PACU  Anesthesia Type:General  Level of Consciousness: drowsy, patient cooperative and responds to stimulation  Airway & Oxygen Therapy: Patient Spontanous Breathing and Patient connected to nasal cannula oxygen  Post-op Assessment: Report given to RN and Post -op Vital signs reviewed and stable  Post vital signs: Reviewed and stable  Last Vitals:  Vitals Value Taken Time  BP    Temp    Pulse 73 07/06/19 1718  Resp 20 07/06/19 1718  SpO2 100 % 07/06/19 1718  Vitals shown include unvalidated device data.  Last Pain:  Vitals:   07/06/19 0746  PainSc: 0-No pain         Complications: No complications documented.   Follows commands.  Purposeful

## 2019-07-06 NOTE — Anesthesia Postprocedure Evaluation (Signed)
Anesthesia Post Note  Patient: Tammy Rodgers  Procedure(s) Performed: IR WITH ANESTHESIA EMBLOLIZATION (N/A )     Patient location during evaluation: PACU Anesthesia Type: General Level of consciousness: awake Pain management: pain level controlled Vital Signs Assessment: post-procedure vital signs reviewed and stable Respiratory status: spontaneous breathing Cardiovascular status: stable Postop Assessment: no apparent nausea or vomiting Anesthetic complications: no   No complications documented.  Last Vitals: There were no vitals filed for this visit.  Last Pain:  Vitals:   07/06/19 0746  PainSc: 0-No pain                 Shakera Ebrahimi

## 2019-07-06 NOTE — Progress Notes (Addendum)
ANTICOAGULATION CONSULT NOTE - Initial Consult  Pharmacy Consult for heparin  Indication: s/p aneurysm embolization   Allergies  Allergen Reactions  . Iodinated Diagnostic Agents Hypertension  . Ritalin [Methylphenidate Hcl]     Seizure    Patient Measurements: Wt= 74.1kg  Vital Signs: Temp: 98.1 F (36.7 C) (06/23 0700) BP: 127/61 (06/23 0700) Pulse Rate: 80 (06/23 0700)  Labs: Recent Labs    07/06/19 0722  HGB 12.0  HCT 35.5*  PLT 276  LABPROT 13.0  INR 1.0  CREATININE 0.80    CrCl cannot be calculated (Unknown ideal weight.).   Medical History: Past Medical History:  Diagnosis Date  . Anemia   . Anxiety   . 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 GERD (gastroesophageal reflux disease)   . High cholesterol   . Hypertension    states BP is up and down; has been on med. x 2 mos.  . 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 Pre-diabetes   . Seizures ([place])   . Sleep apnea    uses CPAP nightly    Medications:  Medications Prior to Admission  Medication Sig Dispense Refill Last Dose  . amLODipine (NORVASC) 10 MG tablet Take 10 mg by mouth daily.    07/06/2019 at Unknown time  . aspirin EC 81 MG tablet Take 81 mg by mouth daily.   07/06/2019 at Unknown time  . cloBAZam (ONFI) 10 MG tablet Take 1 tablet (10 mg total) by mouth at bedtime. 30 tablet 3 07/05/2019 at Unknown time  . clopidogrel (PLAVIX) 75 MG tablet Take 75 mg by mouth daily.   07/06/2019 at 0630  . diphenhydrAMINE (BENADRYL) 25 MG tablet Take 50 mg by mouth once. Take 2 tablets one hour before procedure     . ezetimibe-simvastatin (VYTORIN) 10-20 MG per tablet Take 1 tablet by mouth at bedtime.   07/05/2019 at Unknown time  . LAMICTAL 200 MG tablet TAKE 1 TABLET IN THE MORNING AND 1.5 TABLETS IN THE EVENING (Patient taking differently: Take 200-300 mg by mouth See admin instructions. TAKE 1 TABLET IN THE MORNING AND 1.5 TABLETS IN THE EVENING) 225 tablet 3 07/06/2019 at 0630  .  losartan-hydrochlorothiazide (HYZAAR) 50-12.5 MG tablet Take 1 tablet by mouth daily.    07/05/2019 at Unknown time  . metFORMIN (GLUCOPHAGE) 500 MG tablet Take 250 mg by mouth daily.    07/05/2019 at Unknown time  . Multiple Vitamins-Minerals (MULTIVITAMIN WITH MINERALS) tablet Take 1 tablet by mouth daily.   Past Week at Unknown time  . omeprazole (PRILOSEC) 20 MG capsule Take 20 mg by mouth daily.   07/04/2019  . predniSONE (DELTASONE) 50 MG tablet Take 50 mg by mouth See admin instructions. Take 1 tablet by mouth 13 hours before procedure, 7 hours before procedure, and 1 hour before procedure for 3 doses   07/05/2019 at Unknown time   Scheduled:  . [START ON 07/07/2019] amLODipine  10 mg Oral Daily  . clopidogrel  75 mg Oral 60 min Pre-Op  . lamoTRIgine  200-300 mg Oral See admin instructions  . [START ON 07/07/2019] losartan-hydrochlorothiazide  1 tablet Oral Daily  . niMODipine  0-60 mg Oral 60 min Pre-Op  . pantoprazole  40 mg Oral Daily    Assessment: 59 yo female s/p  cerebral arteriogram and aneurysm embolization on heparin at 500 units/hr -hg= 12, plt= 276   Goal of Therapy:  Heparin level 0.1-0.25 units/ml Monitor platelets by anticoagulation protocol: Yes  Plan:  -Increase heparin to 650 units/hr -heparin level in 6 hours -Daily heparin level and CBC   Hildred Laser, PharmD Clinical Pharmacist **Pharmacist phone directory can now be found on [email or web address] ([email or web address]).  Listed under [place].

## 2019-07-06 NOTE — Anesthesia Postprocedure Evaluation (Signed)
Anesthesia Post Note  Patient: Tammy Rodgers  Procedure(s) Performed: IR WITH ANESTHESIA EMBLOLIZATION (N/A )     Patient location during evaluation: PACU Anesthesia Type: General Level of consciousness: awake Pain management: pain level controlled Vital Signs Assessment: post-procedure vital signs reviewed and stable Respiratory status: spontaneous breathing Cardiovascular status: stable Postop Assessment: no apparent nausea or vomiting Anesthetic complications: no   No complications documented.  Last Vitals: There were no vitals filed for this visit.  Last Pain:  Vitals:   07/06/19 0746  PainSc: 0-No pain                 Beuford Garcilazo

## 2019-07-06 NOTE — Procedures (Signed)
S/P RT [facility] and Bilateral Common carotid arteriograms.. RT Rad approach. Embolization of LT PCOM wide neck lobulated aneurysm with x 1 pipeline flex flow diverter ,3.5 mmx 12 mm ,with stasis. Post procedure. Patient extubated.. Gradually more responsive C/O sore throat. More responsive to simple commands. Moves all 4s spontaneously . RT wrist soft. Distal rad pulse present. S.Jachob Mcclean MD

## 2019-07-06 NOTE — Anesthesia Procedure Notes (Signed)
Procedure Name: Intubation Date/Time: 07/06/2019 2:06 PM Performed by: Shirlyn Goltz, CRNA Pre-anesthesia Checklist: Patient identified, Emergency Drugs available, Suction available and Patient being monitored Patient Re-evaluated:Patient Re-evaluated prior to induction Oxygen Delivery Method: Circle system utilized Preoxygenation: Pre-oxygenation with 100% oxygen Induction Type: IV induction Ventilation: Mask ventilation without difficulty Laryngoscope Size: Mac and 4 Grade View: Grade II Tube type: Oral Tube size: 7.0 mm Number of attempts: 1 Airway Equipment and Method: Stylet Placement Confirmation: ETT inserted through vocal cords under direct vision,  positive ETCO2 and breath sounds checked- equal and bilateral Secured at: 22 cm Tube secured with: Tape Dental Injury: Teeth and Oropharynx as per pre-operative assessment

## 2019-07-06 NOTE — Progress Notes (Addendum)
0900- Spoke with Jannifer Franklin, PA-C. Aware of P2Y12 results. Verbal order for 243mg  ASA in addition to 81mg  taken at home. Hold Nimotop/clopidogrel.

## 2019-07-06 NOTE — H&P (Signed)
Chief Complaint: Patient was seen in consultation today for cerebral arteriogram with left posterior communicating artery aneurysm embolization at the request of Dr Marcial Pacas   Supervising Physician: Luanne Bras  Patient Status: [facility] - Out-pt  History of Present Illness: Tammy Rodgers is a 59 y.o. female   Complex partial sz Hx; HTN; DM; HLD CVA 05/07/19 Speech changes and gait difficulty  CTA revealed IMPRESSION: 1. 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 2. No superimposed atherosclerosis or stenosis identified in the head or neck.  Was seen and followed by Dr Krista Blue Neurology She has had no further symptoms Was referred to Dr Estanislado Pandy for evaluation and management for L PCA aneurysm  Consultation 05/19/19 Discussed the nature of aneurysms including the risk of rupture as well as explained that there are two management options moving forward- either monitoring with routine image scans or with a cerebral angiogram with intervention. Both options were discussed in detail, including risks and benefits.Informed patient that if we are to move forward with intervention, she will need to take Plavix 75 mg once daily and Aspirin 81 mg once daily for 7 days prior to procedure.  Scheduled now for cerebral arteriogram with possible embolization of left posterior communicating artery aneurysm  She has contrast allergy and has been pre medicated with 13 hr Prednisone/Benadryl protocol    Past Medical History:  Diagnosis Date  . Anemia   . Anxiety   . Complex partial seizure disorder ([place])    last seizure 09/2012  . GERD (gastroesophageal reflux disease)   . High cholesterol   . Hypertension    states BP is up and down; has been on med. x 2 mos.  . 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 Pre-diabetes   . Seizures ([place])   . Sleep  apnea    uses CPAP nightly    Past Surgical History:  Procedure Laterality Date  . ABDOMINAL HYSTERECTOMY  12/13/2002   partial  . BREAST LUMPECTOMY WITH NEEDLE LOCALIZATION Left 11/24/2012   Procedure: LEFT BREAST LUMPECTOMY WITH NEEDLE LOCALIZATION;  Surgeon: Harl Bowie, MD;  Location: [facility];  Service: General;  Laterality: Left;  . CHOLECYSTECTOMY    . COLONOSCOPY    . PARATHYROIDECTOMY     partial    Allergies: Iodinated diagnostic agents and Ritalin [methylphenidate hcl]  Medications: Prior to Admission medications   Medication Sig Start Date End Date Taking? Authorizing Provider  amLODipine (NORVASC) 10 MG tablet Take 10 mg by mouth daily.    Yes [provider]  aspirin EC 81 MG tablet Take 81 mg by mouth daily.   Yes [provider]  cloBAZam (ONFI) 10 MG tablet Take 1 tablet (10 mg total) by mouth at bedtime. 05/09/19  Yes Suzzanne Cloud, NP  clopidogrel (PLAVIX) 75 MG tablet Take 75 mg by mouth daily.   Yes [provider]  ezetimibe-simvastatin (VYTORIN) 10-20 MG per tablet Take 1 tablet by mouth at bedtime.   Yes [provider]  LAMICTAL 200 MG tablet TAKE 1 TABLET IN THE MORNING AND 1.5 TABLETS IN THE EVENING Patient taking differently: Take 200-300 mg by mouth See admin instructions. TAKE 1 TABLET IN THE MORNING AND 1.5 TABLETS IN THE EVENING 06/07/19  Yes Marcial Pacas, MD  losartan-hydrochlorothiazide (HYZAAR) 50-12.5 MG tablet Take 1 tablet by mouth daily.  Yes [provider]  metFORMIN (GLUCOPHAGE) 500 MG tablet Take 250 mg by mouth daily.    Yes [provider]  Multiple Vitamins-Minerals (MULTIVITAMIN WITH MINERALS) tablet Take 1 tablet by mouth daily.   Yes [provider]  omeprazole (PRILOSEC) 20 MG capsule Take 20 mg by mouth daily.   Yes [provider]     Family History  Problem Relation Age of Onset  . Stroke Mother   . Heart attack Mother   . Diabetes  Mother   . COPD Father   . Heart disease Other     Social History   Socioeconomic History  . Marital status: Married    Spouse name: Not on file  . Number of children: 2  . Years of education: Bachelors  . Highest education level: Not on file  Occupational History  . Occupation: math Product manager: [employer]  Tobacco Use  . Smoking status: Never Smoker  . Smokeless tobacco: Never Used  Vaping Use  . Vaping Use: Never used  Substance and Sexual Activity  . Alcohol use: No  . Drug use: No  . Sexual activity: Not on file  Other Topics Concern  . Not on file  Social History Narrative   Lives at home with husband.   Right-handed.   No caffeine use.   Social Determinants of Health   Financial Resource Strain:   . Difficulty of Paying Living Expenses:   Food Insecurity:   . Worried About Charity fundraiser in the Last Year:   . Arboriculturist in the Last Year:   Transportation Needs:   . Film/video editor (Medical):   Marland Kitchen Lack of Transportation (Non-Medical):   Physical Activity:   . Days of Exercise per Week:   . Minutes of Exercise per Session:   Stress:   . Feeling of Stress :   Social Connections:   . Frequency of Communication with Friends and Family:   . Frequency of Social Gatherings with Friends and Family:   . Attends Religious Services:   . Active Member of Clubs or Organizations:   . Attends Archivist Meetings:   Marland Kitchen Marital Status:     Review of Systems: A 12 point ROS discussed and pertinent positives are indicated in the HPI above.  All other systems are negative.  Review of Systems  Constitutional: Negative for activity change and fever.  HENT: Negative for hearing loss, tinnitus and trouble swallowing.   Eyes: Negative for visual disturbance.  Respiratory: Negative for shortness of breath.   Cardiovascular: Negative for chest pain.  Gastrointestinal: Negative for abdominal pain, nausea and vomiting.    Genitourinary: Negative for difficulty urinating.  Musculoskeletal: Negative for back pain.  Neurological: Negative for dizziness, tremors, seizures, syncope, facial asymmetry, speech difficulty, weakness, light-headedness, numbness and headaches.  Psychiatric/Behavioral: Negative for behavioral problems and confusion.    Vital Signs: There were no vitals taken for this visit.  Physical Exam Vitals reviewed.  HENT:     Mouth/Throat:     Mouth: Mucous membranes are moist.  Eyes:     Extraocular Movements: Extraocular movements intact.  Cardiovascular:     Rate and Rhythm: Normal rate and regular rhythm.     Heart sounds: Normal heart sounds.  Pulmonary:     Effort: Pulmonary effort is normal.     Breath sounds: Normal breath sounds.  Abdominal:     General: There is no distension.  Palpations: Abdomen is soft.  Musculoskeletal:        General: No swelling, tenderness, deformity or signs of injury. Normal range of motion.     Right lower leg: No edema.     Left lower leg: No edema.  Skin:    General: Skin is warm and dry.  Neurological:     Mental Status: She is alert and oriented to person, place, and time.  Psychiatric:        Mood and Affect: Mood normal.        Behavior: Behavior normal.        Thought Content: Thought content normal.        Judgment: Judgment normal.     Imaging: No results found.  Labs:  CBC: Recent Labs    05/07/19 1344 05/07/19 1351  WBC 5.3  --   HGB 12.8 13.6  HCT 40.0 40.0  PLT 294  --     COAGS: Recent Labs    05/07/19 1344  INR 0.9  APTT 26    BMP: Recent Labs    05/07/19 1344 05/07/19 1351  NA 141 141  K 3.4* 3.1*  CL 102 102  CO2 25  --   GLUCOSE 92 82  BUN 12 13  CALCIUM 11.0*  --   CREATININE 0.70 0.70  GFRNONAA >60  --   GFRAA >60  --     LIVER FUNCTION TESTS: Recent Labs    05/07/19 1344  BILITOT 0.5  AST 31  ALT 29  ALKPHOS 76  PROT 8.4*  ALBUMIN 5.1*    TUMOR MARKERS: No results for  input(s): AFPTM, CEA, CA199, CHROMGRNA in the last 8760 hours.  Assessment and Plan:  Asymptomatic Left posterior communicating artery aneurysm Scheduled for embolization today Risks and benefits of cerebral angiogram with intervention were discussed with the patient including, but not limited to bleeding, infection, vascular injury, contrast induced renal failure, stroke or even death.  This interventional procedure involves the use of X-rays and because of the nature of the planned procedure, it is possible that we will have prolonged use of X-ray fluoroscopy.  Potential radiation risks to you include (but are not limited to) the following: - A slightly elevated risk for cancer  several years later in life. This risk is typically less than 0.5% percent. This risk is low in comparison to the normal incidence of human cancer, which is 33% for women and 50% for men according to the [employer]. - Radiation induced injury can include skin redness, resembling a rash, tissue breakdown / ulcers and hair loss (which can be temporary or permanent).   The likelihood of either of these occurring depends on the difficulty of the procedure and whether you are sensitive to radiation due to previous procedures, disease, or genetic conditions.   IF your procedure requires a prolonged use of radiation, you will be notified and given written instructions for further action.  It is your responsibility to monitor the irradiated area for the 2 weeks following the procedure and to notify your physician if you are concerned that you have suffered a radiation induced injury.    All of the patient's questions were answered, patient is agreeable to proceed.  Consent signed and in chart.  Thank you for this interesting consult.  I greatly enjoyed meeting SHAREEN CAPWELL and look forward to participating in their care.  A copy of this report was sent to the requesting provider on this  date.  Electronically Signed:  Lavonia Drafts, PA-C 07/06/2019, 8:03 AM   I spent a total of    25 Minutes in face to face in clinical consultation, greater than 50% of which was counseling/coordinating care for L PCA aneurysm embolization

## 2019-07-06 NOTE — Progress Notes (Signed)
Patient ID: Tammy Rodgers, female   DOB: January 05, 1961, 59 y.o.   MRN: [id number] INR. Post procedure. More awake alert C/O mainly sore throat. Denies H/A ,N/V ,visual ,motor or speech difficulties. No chest pains or SOB. O/E Ox 3 .  Speech clear with normal comprehension. Pupils 2 to 3 mm RT = LT. No facial symmetry. Tongue midline. No pronation dfift of outstretched arms.  Power equal proportional to effort. Finger to nose RT = Lt no past pointing or Zig Zagging. RT wrist soft .Distal radial pulse present. Plan. 1.Start IV heparin and IV cleviprex IV stat. 2.Ice chips one hrly for sore throat. 3.Diet clear liquids only.Marland Kitchen 4. Awaiting transfer to NICU. D/W patient. S.Brit Wernette MD

## 2019-07-06 NOTE — Anesthesia Preprocedure Evaluation (Signed)
Anesthesia Evaluation  Patient identified by MRN, date of birth, ID band Patient awake    Reviewed: Allergy & Precautions, H&P , NPO status , Patient's Chart, lab work & pertinent test results  Airway Mallampati: II   Neck ROM: full    Dental   Pulmonary sleep apnea ,    breath sounds clear to auscultation       Cardiovascular hypertension,  Rhythm:regular Rate:Normal     Neuro/Psych Seizures -,  PSYCHIATRIC DISORDERS Anxiety  Neuromuscular disease    GI/Hepatic GERD  ,  Endo/Other    Renal/GU      Musculoskeletal   Abdominal   Peds  Hematology   Anesthesia Other Findings   Reproductive/Obstetrics                             Anesthesia Physical Anesthesia Plan  ASA: II  Anesthesia Plan: General   Post-op Pain Management:    Induction: Intravenous  PONV Risk Score and Plan: 3 and Ondansetron, Dexamethasone, Midazolam and Treatment may vary due to age or medical condition  Airway Management Planned: Oral ETT  Additional Equipment: Arterial line  Intra-op Plan:   Post-operative Plan: Extubation in OR  Informed Consent: I have reviewed the patients History and Physical, chart, labs and discussed the procedure including the risks, benefits and alternatives for the proposed anesthesia with the patient or authorized representative who has indicated his/her understanding and acceptance.       Plan Discussed with: CRNA, Anesthesiologist and Surgeon  Anesthesia Plan Comments:         Anesthesia Quick Evaluation

## 2019-07-07 ENCOUNTER — Encounter (HOSPITAL_COMMUNITY): Payer: Self-pay | Admitting: Interventional Radiology

## 2019-07-07 DIAGNOSIS — I671 Cerebral aneurysm, nonruptured: Secondary | ICD-10-CM | POA: Diagnosis not present

## 2019-07-07 LAB — CBC WITH DIFFERENTIAL/PLATELET
Abs Immature Granulocytes: 0.03 10*3/uL (ref 0.00–0.07)
Basophils Absolute: 0 10*3/uL (ref 0.0–0.1)
Basophils Relative: 0 %
Eosinophils Absolute: 0 10*3/uL (ref 0.0–0.5)
Eosinophils Relative: 0 %
HCT: 30.2 % — ABNORMAL LOW (ref 36.0–46.0)
Hemoglobin: 9.9 g/dL — ABNORMAL LOW (ref 12.0–15.0)
Immature Granulocytes: 0 %
Lymphocytes Relative: 19 %
Lymphs Abs: 1.8 10*3/uL (ref 0.7–4.0)
MCH: 30.3 pg (ref 26.0–34.0)
MCHC: 32.8 g/dL (ref 30.0–36.0)
MCV: 92.4 fL (ref 80.0–100.0)
Monocytes Absolute: 0.8 10*3/uL (ref 0.1–1.0)
Monocytes Relative: 9 %
Neutro Abs: 6.8 10*3/uL (ref 1.7–7.7)
Neutrophils Relative %: 72 %
Platelets: 234 10*3/uL (ref 150–400)
RBC: 3.27 MIL/uL — ABNORMAL LOW (ref 3.87–5.11)
RDW: 14.6 % (ref 11.5–15.5)
WBC: 9.5 10*3/uL (ref 4.0–10.5)
nRBC: 0 % (ref 0.0–0.2)

## 2019-07-07 LAB — BASIC METABOLIC PANEL
Anion gap: 7 (ref 5–15)
BUN: 18 mg/dL (ref 6–20)
CO2: 26 mmol/L (ref 22–32)
Calcium: 9.2 mg/dL (ref 8.9–10.3)
Chloride: 109 mmol/L (ref 98–111)
Creatinine, Ser: 0.67 mg/dL (ref 0.44–1.00)
GFR calc Af Amer: >60 mL/min (ref >60)
GFR calc non Af Amer: >60 mL/min (ref >60)
Glucose, Bld: 103 mg/dL — ABNORMAL HIGH (ref 70–99)
Potassium: 4 mmol/L (ref 3.5–5.1)
Sodium: 142 mmol/L (ref 135–145)

## 2019-07-07 LAB — HEPARIN LEVEL (UNFRACTIONATED): Heparin Unfractionated: 0.6 IU/mL (ref 0.30–0.70)

## 2019-07-07 LAB — MRSA PCR SCREENING: MRSA by PCR: NEGATIVE

## 2019-07-07 MED ORDER — HEPARIN (PORCINE) 25000 UT/250ML-% IV SOLN
450.0000 [IU]/h | INTRAVENOUS | Status: DC
  Administered 2019-07-07: 450 [IU]/h via INTRAVENOUS

## 2019-07-07 NOTE — Plan of Care (Signed)
  Problem: Education: Goal: Knowledge of General Education information will improve Description: Including pain rating scale, medication(s)/side effects and non-pharmacologic comfort measures 07/07/2019 1230 by Jenne Campus, RN Outcome: Completed/Met 07/07/2019 1147 by Jenne Campus, RN Outcome: Progressing   Problem: Clinical Measurements: Goal: Ability to maintain clinical measurements within normal limits will improve 07/07/2019 1230 by Jenne Campus, RN Outcome: Completed/Met 07/07/2019 1147 by Jenne Campus, RN Outcome: Progressing Goal: Will remain free from infection 07/07/2019 1230 by Jenne Campus, RN Outcome: Completed/Met 07/07/2019 1147 by Jenne Campus, RN Outcome: Progressing Goal: Diagnostic test results will improve 07/07/2019 1230 by Jenne Campus, RN Outcome: Completed/Met 07/07/2019 1147 by Jenne Campus, RN Outcome: Progressing Goal: Respiratory complications will improve 07/07/2019 1230 by Jenne Campus, RN Outcome: Completed/Met 07/07/2019 1147 by Jenne Campus, RN Outcome: Progressing Goal: Cardiovascular complication will be avoided 07/07/2019 1230 by Jenne Campus, RN Outcome: Completed/Met 07/07/2019 1147 by Jenne Campus, RN Outcome: Progressing   Problem: Health Behavior/Discharge Planning: Goal: Ability to manage health-related needs will improve 07/07/2019 1230 by Jenne Campus, RN Outcome: Completed/Met 07/07/2019 1147 by Jenne Campus, RN Outcome: Progressing

## 2019-07-07 NOTE — Progress Notes (Signed)
ANTICOAGULATION CONSULT NOTE   Pharmacy Consult for heparin  Indication: s/p aneurysm embolization   Allergies  Allergen Reactions  . Iodinated Diagnostic Agents Hypertension  . Ritalin [Methylphenidate Hcl]     Seizure    Patient Measurements: Wt= 74.1kg  Vital Signs: Temp: 98.6 F (37 C) (06/24 0000) Temp Source: Oral (06/24 0000) BP: 130/61 (06/24 0100) Pulse Rate: 75 (06/24 0100)  Labs: Recent Labs    07/06/19 0722 07/07/19 0015  HGB 12.0  --   HCT 35.5*  --   PLT 276  --   LABPROT 13.0  --   INR 1.0  --   HEPARINUNFRC  --  0.60  CREATININE 0.80  --     CrCl cannot be calculated (Unknown ideal weight.).   Assessment: 59 yo female s/p  cerebral arteriogram and aneurysm embolization on heparin at 500 units/hr -hg= 12, plt= 276  Heparin level 0.6 (supratherapeutic) on gtt at 650 units/hr. No bleeding noted.  Goal of Therapy:  Heparin level 0.1-0.25 units/ml Monitor platelets by anticoagulation protocol: Yes   Plan:  -Decrease heparin to 450 units/hr - heparin to be turned off at 0700 in a.m. so no further levels needed  Sherlon Handing, PharmD, BCPS Please see amion for complete clinical pharmacist phone list 07/07/2019 1:16 AM

## 2019-07-07 NOTE — Progress Notes (Signed)
Pt given discharge instructions with understanding. PT's daughter at bedside. PT's home medications retrieved from pharmacy and given to pt. Pt has no questions at this time. IV and monitor d/c. Pt being taken home by daughter.

## 2019-07-07 NOTE — Discharge Instructions (Signed)
Endovascular Therapy for Cerebral Aneurysm, Care After This sheet gives you information about how to care for yourself after your procedure. Your health care provider may also give you more specific instructions. If you have problems or questions, contact your health care provider. What can I expect after the procedure? After the procedure, it is common to have:  Pain, tenderness, and swelling around your incision.  Headaches. Follow these instructions at home: Medicines  Take over-the-counter and prescription medicines only as told by your health care provider.  If you were prescribed medicines to prevent blood clots (antiplatelet medicines), talk with your health care provider about the risks. These medicines can increase bleeding, so you may need to avoid certain activities. Incision care   Follow instructions from your health care provider about how to take care of your incision. Make sure you: ? Wash your hands with soap and water before you change your bandage (dressing). If soap and water are not available, use hand sanitizer. ? Change your dressing as told by your health care provider. ? Leave stitches (sutures), skin glue, or adhesive strips in place. These skin closures may need to stay in place for 2 weeks or longer. If adhesive strip edges start to loosen and curl up, you may trim the loose edges. Do not remove adhesive strips completely unless your health care provider tells you to do that.  Check your incision area every day for signs of infection. Check for: ? Redness, swelling, or pain. ? Fluid or blood. ? Warmth. ? Pus or a bad smell. Activity  Ask your health care provider what activities are safe for you during recovery. Most people can return to normal activities 2-6 weeks after the procedure.  Do not drive until your health care provider approves.  Do not drive or use heavy machinery while taking prescription pain medicine.  Do not lift anything that is heavier  than 10 lb (4.5 kg), or the limit that you are told, until your health care provider says that it is safe.  Exercise regularly, as directed by your health care provider.  Attend rehabilitation therapy as told by your health care provider. This may include: ? Physical and occupational therapy. ? Speech-language therapy. ? Brain exercises. ? Balance exercises. ? Individual or group therapy. ? Education about your condition and treatment. Eating and drinking   Drink enough fluid to keep your urine pale yellow.  Eat a healthy diet. This includes plenty of fruits and vegetables, whole grains, low-fat dairy products, and lean protein. General instructions  Do not use any products that contain nicotine or tobacco, such as cigarettes and e-cigarettes. If you need help quitting, ask your health care provider.  Manage your stress. If you need help with this, talk with your health care provider.  Wear compression stockings as told by your health care provider. These stockings help to prevent blood clots and reduce swelling in your legs.  Keep all follow-up visits as told by your health care provider. This is important. Contact a health care provider if:  You have redness, swelling, or pain around your incision.  You have fluid or blood coming from your incision.  Your incision feels warm to the touch.  You have pus or a bad smell coming from your incision.  You have a fever. Get help right away if:   You have: ? Stiffness in your neck. ? Pain, numbness, weakness, or swelling in your legs. ? Severe chest pain. ? Difficulty breathing. ? Confusion.  You have  any symptoms of stroke. "BE FAST" is an easy way to remember the main warning signs of stroke: ? B - Balance. Signs are dizziness, sudden trouble walking, or loss of balance. ? E - Eyes. Signs are trouble seeing or a sudden change in vision. ? F - Face. Signs are sudden weakness or numbness of the face, or the face or eyelid  drooping on one side. ? A - Arms. Signs are weakness or numbness in an arm. This happens suddenly and usually on one side of the body. ? S - Speech. Signs are sudden trouble speaking, slurred speech, or trouble understanding what people say. ? T - Time. Time to call emergency services. Write down what time symptoms started.  You have other signs of stroke, such as: ? A sudden, severe headache that does not get better with medicine. ? Sudden nausea or vomiting. ? A seizure. These symptoms may represent a serious problem that is an emergency. Do not wait to see if the symptoms will go away. Get medical help right away. Call your local emergency services (911 in the [place] Do not drive yourself to the hospital. Summary  After this procedure, it is common to have some pain and swelling around your incision.  Most people can return to normal activities 2 weeks after the procedure. Ask your health care provider what activities are safe for you during recovery.  Do not drive until your health care provider approves. Do not drive while taking prescription pain medicine.  Do not use any products that contain nicotine or tobacco, such as cigarettes and e-cigarettes. If you need help quitting, ask your health care provider. This information is not intended to replace advice given to you by your health care provider. Make sure you discuss any questions you have with your health care provider. Document Revised: 02/19/2018 Document Reviewed: 04/07/2016 Elsevier Patient Education  [place].

## 2019-07-07 NOTE — Discharge Summary (Signed)
Patient ID: Tammy Rodgers MRN: [id number] DOB/AGE: 58-Sep-1962 58 y.o.  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  She was seen by Dr. Estanislado Pandy in consultation 05/19/19 and at that time decided to proceed with endovascular treatment of her aneurysm.  She presented for the procedure yesterday in her usual state of health and without new symptoms or complaints.  She underwent diagnostic angiogram with embolization of the L PCOM wide neck lobulated aneurysm with pipeline flow diverter placement. She recovered well in PACU and was admitted to the Neuro ICU overnight for observation.  She has continued to recover well and as expected.  She is assessed this AM alongside Dr. Estanislado Pandy and found to be stable.  Eating breakfast with tolerance.  She has ambulated without difficulty.  She has been able to void successfully.  She is found to be stable for discharge home today into the care of her daughter.  She is given care instructions.  She understands to continue her aspirin 81mg  daily and Plavix 75 mg daily.  The procedure and expected nature of the aneurysm treatment is dicsussed with the patient and her family.  She understands our schedulers will call to arrange follow-up which is expected in approximately 2 weeks.    Discharge Exam: Blood pressure 132/66, pulse 66, temperature 97.6 F (36.4 C), temperature source Oral, resp. rate 13, SpO2 100 %. General appearance: alert, cooperative, appears stated age and no distress Resp: clear to auscultation bilaterally Cardio: regular rate and rhythm, S1, S2 normal, no murmur, click, rub or  gallop GI: soft, non-tender; bowel sounds normal; no masses,  no organomegaly Pulses: 2+ and symmetric palpable in the R wrist Incision/Wound: Dressing in place to R wrist. No bleeding noted.  Neuro: AOx4.  Face symmetry.  Tongue midline.  EOMs intact.  Speech intelligible. Bilateral upper and lower extremitiy strength 5/5.  Point-to-point intact.   Disposition: Discharge disposition: 01-Home or Self Care       Discharge Instructions    Call MD for:  extreme fatigue   Complete by: As directed    Call MD for:  persistant dizziness or light-headedness   Complete by: As directed    Call MD for:  persistant nausea and vomiting   Complete by: As directed    Call MD for:  redness, tenderness, or signs of infection (pain, swelling, redness, odor or green/yellow discharge around incision site)   Complete by: As directed    Call MD for:  severe uncontrolled pain   Complete by: As directed    Diet - low sodium heart healthy   Complete by: As directed    Discharge instructions   Complete by: As directed    May remove wrist dressing tomorrow. No bending, lifting, stooping for 2 weeks.  Rest and recover for the next 1-2 days, then increase activity slowly.  No driving for 2 weeks.  Expect follow-up with Dr. Estanislado Pandy in 2 weeks.   Increase activity slowly   Complete by: As directed    Remove dressing in 24 hours   Complete by: As directed      Allergies as of 07/07/2019      Reactions   Iodinated Diagnostic Agents  Hypertension   Ritalin [methylphenidate Hcl]    Seizure      Medication List    TAKE these medications   amLODipine 10 MG tablet Commonly known as: NORVASC Take 10 mg by mouth daily.   aspirin EC 81 MG tablet Take 81 mg by mouth daily.   cloBAZam 10 MG tablet Commonly known as: Onfi Take 1 tablet (10 mg total) by mouth at bedtime.   clopidogrel 75 MG tablet Commonly known as: PLAVIX Take 75 mg by mouth daily.   diphenhydrAMINE 25 MG tablet Commonly known  as: BENADRYL Take 50 mg by mouth once. Take 2 tablets one hour before procedure   ezetimibe-simvastatin 10-20 MG tablet Commonly known as: VYTORIN Take 1 tablet by mouth at bedtime.   LaMICtal 200 MG tablet Generic drug: lamoTRIgine TAKE 1 TABLET IN THE MORNING AND 1.5 TABLETS IN THE EVENING What changed:   how much to take  how to take this  when to take this   losartan-hydrochlorothiazide 50-12.5 MG tablet Commonly known as: HYZAAR Take 1 tablet by mouth daily.   metFORMIN 500 MG tablet Commonly known as: GLUCOPHAGE Take 250 mg by mouth daily.   multivitamin with minerals tablet Take 1 tablet by mouth daily.   omeprazole 20 MG capsule Commonly known as: PRILOSEC Take 20 mg by mouth daily.   predniSONE 50 MG tablet Commonly known as: DELTASONE Take 50 mg by mouth See admin instructions. Take 1 tablet by mouth 13 hours before procedure, 7 hours before procedure, and 1 hour before procedure for 3 doses       Follow-up Information    Luanne Bras, MD Follow up.   Specialties: Interventional Radiology, Radiology Why: Schedulers will call with the date and time of a follow-up appointment, expected in 2 weeks. Contact information: [place] [address] [phone]                Electronically Signed: Docia Barrier, PA 07/07/2019, 12:15 PM   I have spent Greater Than 30 Minutes discharging Tammy Rodgers.

## 2019-07-11 ENCOUNTER — Encounter (HOSPITAL_COMMUNITY): Payer: Self-pay

## 2019-07-11 ENCOUNTER — Other Ambulatory Visit (HOSPITAL_COMMUNITY): Payer: Self-pay | Admitting: Interventional Radiology

## 2019-07-11 DIAGNOSIS — I671 Cerebral aneurysm, nonruptured: Secondary | ICD-10-CM

## 2019-07-11 HISTORY — PX: IR 3D INDEPENDENT WKST: IMG2385

## 2019-07-13 ENCOUNTER — Other Ambulatory Visit (HOSPITAL_COMMUNITY): Payer: Self-pay | Admitting: Interventional Radiology

## 2019-07-13 ENCOUNTER — Other Ambulatory Visit: Payer: Self-pay | Admitting: Neurology

## 2019-07-13 DIAGNOSIS — I671 Cerebral aneurysm, nonruptured: Secondary | ICD-10-CM

## 2019-07-18 ENCOUNTER — Emergency Department (HOSPITAL_COMMUNITY)
Admission: EM | Admit: 2019-07-18 | Discharge: 2019-07-18 | Disposition: A | Discharge status: Home / Self Care | Attending: Emergency Medicine | Admitting: Emergency Medicine

## 2019-07-18 ENCOUNTER — Other Ambulatory Visit: Payer: Self-pay

## 2019-07-18 ENCOUNTER — Encounter (HOSPITAL_COMMUNITY): Payer: Self-pay | Admitting: *Deleted

## 2019-07-18 DIAGNOSIS — I10 Essential (primary) hypertension: Secondary | ICD-10-CM | POA: Insufficient documentation

## 2019-07-18 DIAGNOSIS — Z7982 Long term (current) use of aspirin: Secondary | ICD-10-CM | POA: Diagnosis not present

## 2019-07-18 DIAGNOSIS — R319 Hematuria, unspecified: Secondary | ICD-10-CM | POA: Insufficient documentation

## 2019-07-18 DIAGNOSIS — Z7902 Long term (current) use of antithrombotics/antiplatelets: Secondary | ICD-10-CM | POA: Insufficient documentation

## 2019-07-18 DIAGNOSIS — R310 Gross hematuria: Secondary | ICD-10-CM

## 2019-07-18 DIAGNOSIS — Z7984 Long term (current) use of oral hypoglycemic drugs: Secondary | ICD-10-CM | POA: Insufficient documentation

## 2019-07-18 DIAGNOSIS — Z79899 Other long term (current) drug therapy: Secondary | ICD-10-CM | POA: Insufficient documentation

## 2019-07-18 LAB — URINALYSIS, ROUTINE W REFLEX MICROSCOPIC
Bacteria, UA: NONE SEEN
Bilirubin Urine: NEGATIVE
Glucose, UA: NEGATIVE mg/dL
Ketones, ur: NEGATIVE mg/dL
Nitrite: NEGATIVE
Protein, ur: 100 mg/dL — AB
RBC / HPF: >50 RBC/hpf — ABNORMAL HIGH (ref 0–5)
Specific Gravity, Urine: 1.02 (ref 1.005–1.030)
pH: 6 (ref 5.0–8.0)

## 2019-07-18 LAB — BASIC METABOLIC PANEL
Anion gap: 13 (ref 5–15)
BUN: 21 mg/dL — ABNORMAL HIGH (ref 6–20)
CO2: 24 mmol/L (ref 22–32)
Calcium: 10.2 mg/dL (ref 8.9–10.3)
Chloride: 104 mmol/L (ref 98–111)
Creatinine, Ser: 0.9 mg/dL (ref 0.44–1.00)
GFR calc Af Amer: >60 mL/min (ref >60)
GFR calc non Af Amer: >60 mL/min (ref >60)
Glucose, Bld: 96 mg/dL (ref 70–99)
Potassium: 3.5 mmol/L (ref 3.5–5.1)
Sodium: 141 mmol/L (ref 135–145)

## 2019-07-18 LAB — CBC
HCT: 36.4 % (ref 36.0–46.0)
Hemoglobin: 11.6 g/dL — ABNORMAL LOW (ref 12.0–15.0)
MCH: 29.5 pg (ref 26.0–34.0)
MCHC: 31.9 g/dL (ref 30.0–36.0)
MCV: 92.6 fL (ref 80.0–100.0)
Platelets: 310 10*3/uL (ref 150–400)
RBC: 3.93 MIL/uL (ref 3.87–5.11)
RDW: 14.4 % (ref 11.5–15.5)
WBC: 4.4 10*3/uL (ref 4.0–10.5)
nRBC: 0 % (ref 0.0–0.2)

## 2019-07-18 MED ORDER — CEPHALEXIN 500 MG PO CAPS
500.0000 mg | ORAL_CAPSULE | Freq: Three times a day (TID) | ORAL | 0 refills | Status: DC
Start: 2019-07-18 — End: 2019-08-08

## 2019-07-18 NOTE — Discharge Instructions (Addendum)
You were seen today with concerns for blood in your urine.  You will be placed on an antibiotic for possible UTI.  If the bloody urine does not clear up, you need to see urology and may need a cystoscopy.  You may continue your Plavix for now.

## 2019-07-18 NOTE — ED Triage Notes (Signed)
Pt says that she has had two episodes of red urine this evening, denies pain with urination. Pt just started on Plavix in June prior to brain  Surgery procedure.

## 2019-07-18 NOTE — ED Provider Notes (Signed)
[facility] EMERGENCY DEPARTMENT Provider Note   CSN: [id number] Arrival date & time: 07/18/19  2024     History No chief complaint on file.   Tammy Rodgers is a 59 y.o. female.  HPI     This is a 59 year old female with history of seizure disorder, hypertension, aneurysm status post embolization 2 weeks ago who presents with hematuria.  She states that this evening she noted gross blood in her urine.  She has not had any dysuria, fevers, or back pain.  She is on Plavix.  She has never had a history of urinary issues in the past.  Because of her recent surgery, she called her primary physician who recommended that she be evaluated.  She has no abdominal pain, nausea, vomiting.  No other symptoms.  Past Medical History:  Diagnosis Date  . Anemia   . Anxiety   . 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 GERD (gastroesophageal reflux disease)   . High cholesterol   . Hypertension    states BP is up and down; has been on med. x 2 mos.  . 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 Pre-diabetes   . Seizures ([place])   . Sleep apnea    uses CPAP nightly    Patient Active Problem List   Diagnosis Date Noted  . Gait abnormality 05/10/2019  . Partial symptomatic epilepsy with complex partial seizures, not intractable, without status epilepticus ([place]) 05/10/2019  . Brain aneurysm 05/09/2019  . Bilateral carpal tunnel syndrome 04/28/2018  . Paresthesia 01/28/2017  . Encounter for therapeutic drug monitoring 04/17/2016  . Falls 12/10/2015  . Acute confusional state 12/10/2015  . Weakness 12/10/2015  . Seizure disorder, complex partial ([place]) 01/15/2014  . Hypersomnia, persistent 03/06/2013  . Obstructive sleep apnea 11/14/2012  . Bloody discharge from left nipple 09/27/2012    Past Surgical History:  Procedure Laterality Date  . ABDOMINAL HYSTERECTOMY  12/13/2002   partial  . BREAST LUMPECTOMY WITH NEEDLE LOCALIZATION Left 11/24/2012    Procedure: LEFT BREAST LUMPECTOMY WITH NEEDLE LOCALIZATION;  Surgeon: Harl Bowie, MD;  Location: [place];  Service: General;  Laterality: Left;  . CHOLECYSTECTOMY    . COLONOSCOPY    . IR 3D INDEPENDENT WKST  07/11/2019  . IR ANGIO INTRA EXTRACRAN SEL COM CAROTID INNOMINATE UNI R MOD SED  07/06/2019  . IR ANGIO INTRA EXTRACRAN SEL INTERNAL CAROTID UNI L MOD SED  07/06/2019  . IR ANGIO VERTEBRAL SEL VERTEBRAL UNI L MOD SED  07/06/2019  . IR ANGIOGRAM FOLLOW UP STUDY  07/06/2019  . IR TRANSCATH/EMBOLIZ  07/06/2019  . IR US GUIDE VASC ACCESS RIGHT  07/06/2019  . PARATHYROIDECTOMY     partial  . RADIOLOGY WITH ANESTHESIA N/A 07/06/2019   Procedure: IR WITH ANESTHESIA EMBLOLIZATION;  Surgeon: Luanne Bras, MD;  Location: [place];  Service: Radiology;  Laterality: N/A;     OB History   No obstetric history on file.     Family History  Problem Relation Age of Onset  . Stroke Mother   . Heart attack Mother   . Diabetes Mother   . COPD Father   . Heart disease Other     Social History   Tobacco Use  . Smoking status: Never Smoker  . Smokeless tobacco: Never Used  Vaping Use  . Vaping Use: Never used  Substance Use Topics  . Alcohol use: No  . Drug use: No  Home Medications Prior to Admission medications   Medication Sig Start Date End Date Taking? Authorizing Provider  amLODipine (NORVASC) 10 MG tablet Take 10 mg by mouth daily.     [provider]  aspirin EC 81 MG tablet Take 81 mg by mouth daily.    [provider]  cephALEXin (KEFLEX) 500 MG capsule Take 1 capsule (500 mg total) by mouth 3 (three) times daily. 07/18/19   Nosson Wender, Barbette Hair, MD  cloBAZam (ONFI) 10 MG tablet Take 1 tablet (10 mg total) by mouth at bedtime. 05/09/19   Suzzanne Cloud, NP  clopidogrel (PLAVIX) 75 MG tablet Take 75 mg by mouth daily.    [provider]  diphenhydrAMINE (BENADRYL) 25 MG tablet Take 50 mg by mouth once. Take 2 tablets one hour  before procedure    [provider]  ezetimibe-simvastatin (VYTORIN) 10-20 MG per tablet Take 1 tablet by mouth at bedtime.    [provider]  LAMICTAL 200 MG tablet TAKE 1 TABLET IN THE MORNING AND 1.5 TABLETS IN THE EVENING Patient taking differently: Take 200-300 mg by mouth See admin instructions. TAKE 1 TABLET IN THE MORNING AND 1.5 TABLETS IN THE EVENING 06/07/19   Marcial Pacas, MD  losartan-hydrochlorothiazide (HYZAAR) 50-12.5 MG tablet Take 1 tablet by mouth daily.     [provider]  metFORMIN (GLUCOPHAGE) 500 MG tablet Take 250 mg by mouth daily.     [provider]  Multiple Vitamins-Minerals (MULTIVITAMIN WITH MINERALS) tablet Take 1 tablet by mouth daily.    [provider]  omeprazole (PRILOSEC) 20 MG capsule Take 20 mg by mouth daily.    [provider]  predniSONE (DELTASONE) 50 MG tablet Take 50 mg by mouth See admin instructions. Take 1 tablet by mouth 13 hours before procedure, 7 hours before procedure, and 1 hour before procedure for 3 doses    [provider]    Allergies    Iodinated diagnostic agents and Ritalin [methylphenidate hcl]  Review of Systems   Review of Systems  Constitutional: Negative for fever.  Respiratory: Negative for shortness of breath.   Cardiovascular: Negative for chest pain.  Gastrointestinal: Negative for abdominal pain, nausea and vomiting.  Genitourinary: Positive for hematuria. Negative for difficulty urinating and dysuria.  All other systems reviewed and are negative.   Physical Exam Updated Vital Signs BP (!) 143/65   Pulse 91   Temp 98.2 F (36.8 C) (Oral)   Resp 16   SpO2 100%   Physical Exam Vitals and nursing note reviewed.  Constitutional:      Appearance: She is well-developed. She is obese. She is not ill-appearing.  HENT:     Head: Normocephalic and atraumatic.     Nose: Nose normal.  Eyes:     Pupils: Pupils are equal, round, and reactive to light.    Cardiovascular:     Rate and Rhythm: Normal rate and regular rhythm.     Heart sounds: Normal heart sounds.  Pulmonary:     Effort: Pulmonary effort is normal. No respiratory distress.     Breath sounds: No wheezing.  Abdominal:     General: Bowel sounds are normal.     Palpations: Abdomen is soft.     Tenderness: There is no abdominal tenderness. There is no guarding or rebound.  Musculoskeletal:     Cervical back: Neck supple.  Skin:    General: Skin is warm and dry.  Neurological:     Mental Status: She is  alert and oriented to person, place, and time.  Psychiatric:        Mood and Affect: Mood normal.     ED Results / Procedures / Treatments   Labs (all labs ordered are listed, but only abnormal results are displayed) Labs Reviewed  URINALYSIS, ROUTINE W REFLEX MICROSCOPIC - Abnormal; Notable for the following components:      Result Value   Color, Urine RED (*)    APPearance CLOUDY (*)    Hgb urine dipstick LARGE (*)    Protein, ur 100 (*)    Leukocytes,Ua TRACE (*)    RBC / HPF >50 (*)    All other components within normal limits  CBC - Abnormal; Notable for the following components:   Hemoglobin 11.6 (*)    All other components within normal limits  BASIC METABOLIC PANEL - Abnormal; Notable for the following components:   BUN 21 (*)    All other components within normal limits  URINE CULTURE    EKG None  Radiology No results found.  Procedures Procedures (including critical care time)  Medications Ordered in ED Medications - No data to display  ED Course  I have reviewed the triage vital signs and the nursing notes.  Pertinent labs & imaging results that were available during my care of the patient were reviewed by me and considered in my medical decision making (see chart for details).    MDM Rules/Calculators/A&P                          Patient presents with isolated gross hematuria.  She is nontoxic-appearing and vital signs are reassuring.   She has absolutely no other symptoms.  Nothing to suggest pyelonephritis.  Hematuria could be related to UTI, structural lesion, trauma although patient does not have a history of trauma.  Hematocrit is stable.  Urinalysis with greater than 50 white cells, trace leukocyte esterase, no bacteria seen.  I did send a culture given that it is very common to have hematuria with urinary tract infection.  We discussed watchful waiting versus placing on an antibiotic to see if this cleared up.  We elected to place her on antibiotic to cover for UTI while waiting for culture.  I otherwise have low suspicion for upper urinary source, kidney stone given absence of pain or other emergent pathology.  Urology follow-up provided.  After history, exam, and medical workup I feel the patient has been appropriately medically screened and is safe for discharge home. Pertinent diagnoses were discussed with the patient. Patient was given return precautions.   Final Clinical Impression(s) / ED Diagnoses Final diagnoses:  Gross hematuria    Rx / DC Orders ED Discharge Orders         Ordered    cephALEXin (KEFLEX) 500 MG capsule  3 times daily     Discontinue  Reprint     07/18/19 2341           Merryl Hacker, MD 07/18/19 2349

## 2019-07-20 LAB — URINE CULTURE: Culture: NO GROWTH

## 2019-07-22 ENCOUNTER — Ambulatory Visit (HOSPITAL_COMMUNITY)

## 2019-07-23 ENCOUNTER — Other Ambulatory Visit: Payer: Self-pay

## 2019-07-23 ENCOUNTER — Emergency Department (HOSPITAL_BASED_OUTPATIENT_CLINIC_OR_DEPARTMENT_OTHER)
Admission: EM | Admit: 2019-07-23 | Discharge: 2019-07-23 | Disposition: A | Discharge status: Home / Self Care | Attending: Emergency Medicine | Admitting: Emergency Medicine

## 2019-07-23 ENCOUNTER — Emergency Department (HOSPITAL_BASED_OUTPATIENT_CLINIC_OR_DEPARTMENT_OTHER)

## 2019-07-23 ENCOUNTER — Encounter (HOSPITAL_BASED_OUTPATIENT_CLINIC_OR_DEPARTMENT_OTHER): Payer: Self-pay | Admitting: Emergency Medicine

## 2019-07-23 DIAGNOSIS — R319 Hematuria, unspecified: Secondary | ICD-10-CM

## 2019-07-23 DIAGNOSIS — Z79899 Other long term (current) drug therapy: Secondary | ICD-10-CM | POA: Insufficient documentation

## 2019-07-23 DIAGNOSIS — Z7982 Long term (current) use of aspirin: Secondary | ICD-10-CM | POA: Insufficient documentation

## 2019-07-23 DIAGNOSIS — Z7984 Long term (current) use of oral hypoglycemic drugs: Secondary | ICD-10-CM | POA: Insufficient documentation

## 2019-07-23 DIAGNOSIS — I10 Essential (primary) hypertension: Secondary | ICD-10-CM | POA: Insufficient documentation

## 2019-07-23 LAB — CBC WITH DIFFERENTIAL/PLATELET
Abs Immature Granulocytes: 0.01 10*3/uL (ref 0.00–0.07)
Basophils Absolute: 0 10*3/uL (ref 0.0–0.1)
Basophils Relative: 1 %
Eosinophils Absolute: 0 10*3/uL (ref 0.0–0.5)
Eosinophils Relative: 2 %
HCT: 36.5 % (ref 36.0–46.0)
Hemoglobin: 11.9 g/dL — ABNORMAL LOW (ref 12.0–15.0)
Immature Granulocytes: 0 %
Lymphocytes Relative: 46 %
Lymphs Abs: 1.3 10*3/uL (ref 0.7–4.0)
MCH: 30.4 pg (ref 26.0–34.0)
MCHC: 32.6 g/dL (ref 30.0–36.0)
MCV: 93.1 fL (ref 80.0–100.0)
Monocytes Absolute: 0.3 10*3/uL (ref 0.1–1.0)
Monocytes Relative: 11 %
Neutro Abs: 1.1 10*3/uL — ABNORMAL LOW (ref 1.7–7.7)
Neutrophils Relative %: 40 %
Platelets: 275 10*3/uL (ref 150–400)
RBC: 3.92 MIL/uL (ref 3.87–5.11)
RDW: 14.3 % (ref 11.5–15.5)
WBC: 2.7 10*3/uL — ABNORMAL LOW (ref 4.0–10.5)
nRBC: 0 % (ref 0.0–0.2)

## 2019-07-23 LAB — BASIC METABOLIC PANEL
Anion gap: 11 (ref 5–15)
BUN: 17 mg/dL (ref 6–20)
CO2: 27 mmol/L (ref 22–32)
Calcium: 10.2 mg/dL (ref 8.9–10.3)
Chloride: 103 mmol/L (ref 98–111)
Creatinine, Ser: 0.72 mg/dL (ref 0.44–1.00)
GFR calc Af Amer: >60 mL/min (ref >60)
GFR calc non Af Amer: >60 mL/min (ref >60)
Glucose, Bld: 113 mg/dL — ABNORMAL HIGH (ref 70–99)
Potassium: 3.4 mmol/L — ABNORMAL LOW (ref 3.5–5.1)
Sodium: 141 mmol/L (ref 135–145)

## 2019-07-23 LAB — URINALYSIS, ROUTINE W REFLEX MICROSCOPIC

## 2019-07-23 LAB — URINALYSIS, MICROSCOPIC (REFLEX): RBC / HPF: >50 RBC/hpf (ref 0–5)

## 2019-07-23 IMAGING — CT CT RENAL STONE PROTOCOL
2 of 4 series · 17 of 46 positions shown, 19 images · non-contrast
Comparison: None.

CLINICAL DATA: 58-year-old female acute LEFT flank and abdominal
pain with hematuria.

EXAM:
CT ABDOMEN AND PELVIS WITHOUT CONTRAST
TECHNIQUE: Multidetector CT imaging of the abdomen and pelvis was performed
following the standard protocol without IV contrast.

[Series 2: axial st · axial · 0.88mm/px · z∈[-402,-2]mm · 14 of 88 slices shown, 16 images]
[im 4/88  soft-tissue]
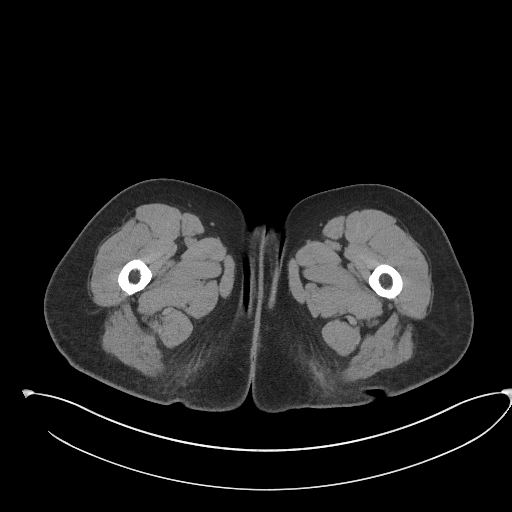
[im 4/88  bone]
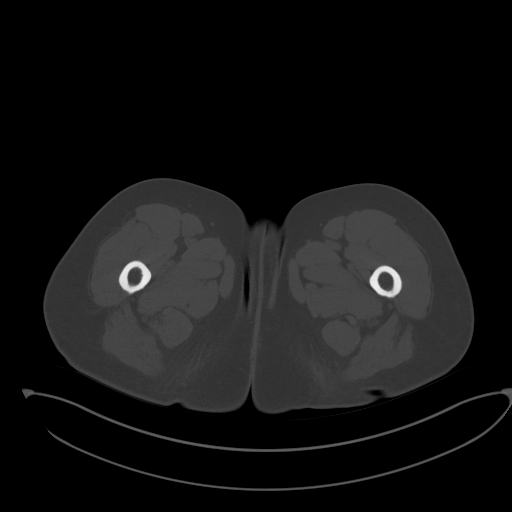
[im 11/88  soft-tissue]
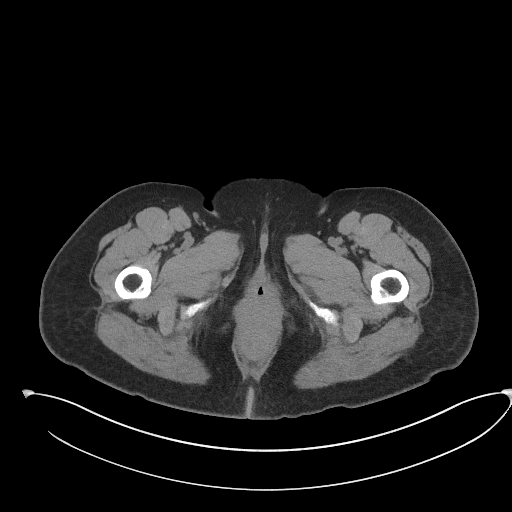
[im 17/88  soft-tissue]
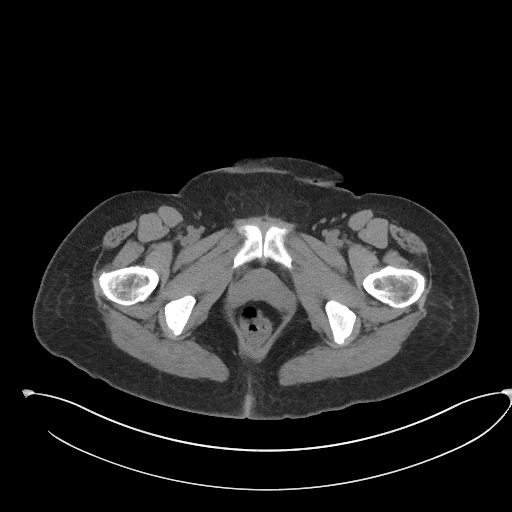
[im 24/88  soft-tissue]
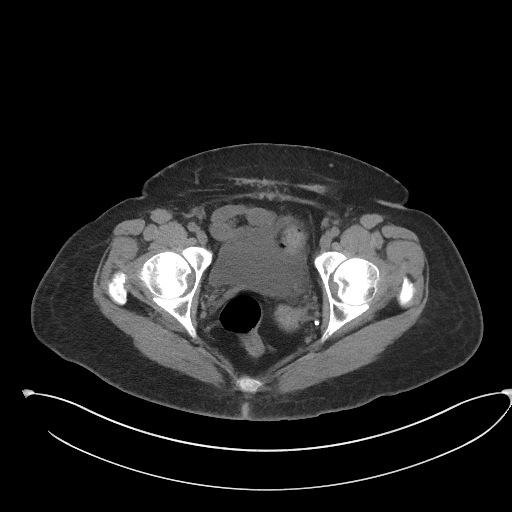
[im 31/88  soft-tissue]
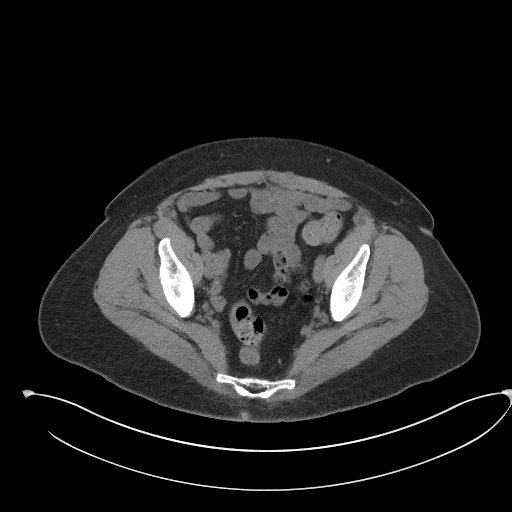
[im 34/88  soft-tissue]
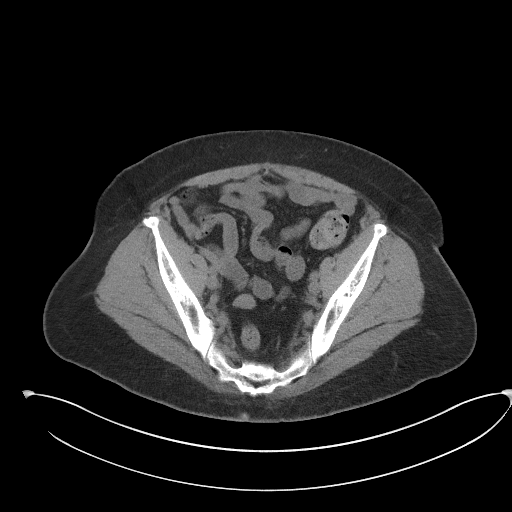
[im 41/88  soft-tissue]
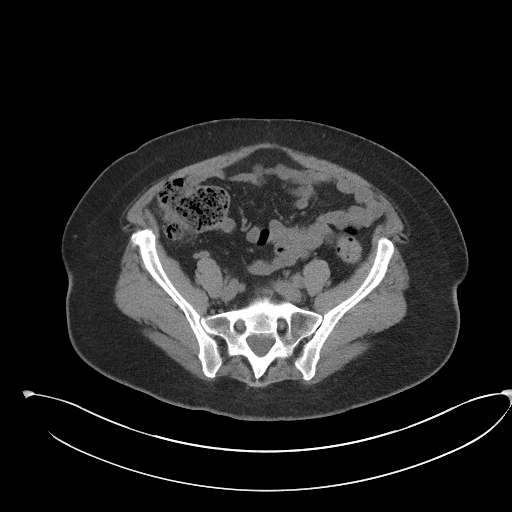
[im 47/88  soft-tissue]
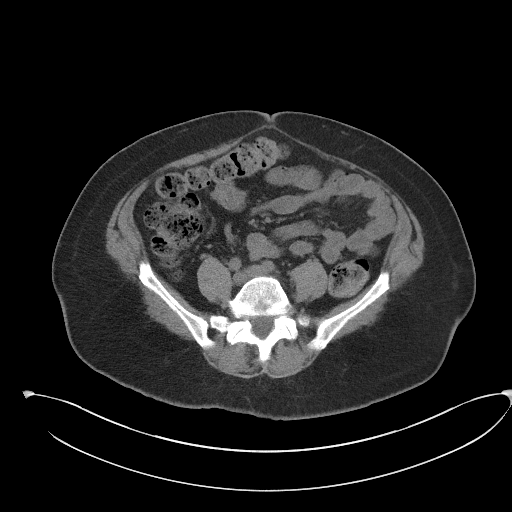
[im 54/88  soft-tissue]
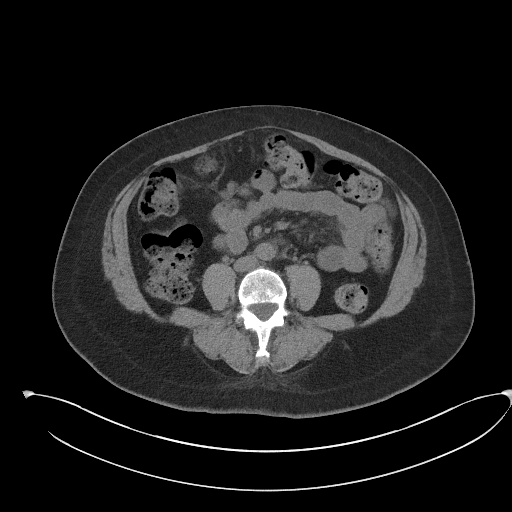
[im 54/88  bone]
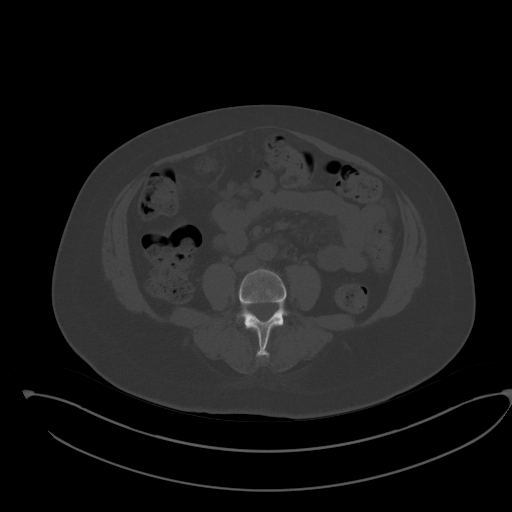
[im 57/88  soft-tissue]
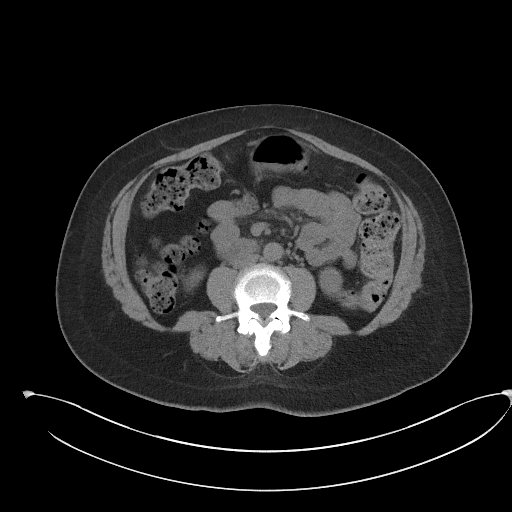
[im 64/88  soft-tissue]
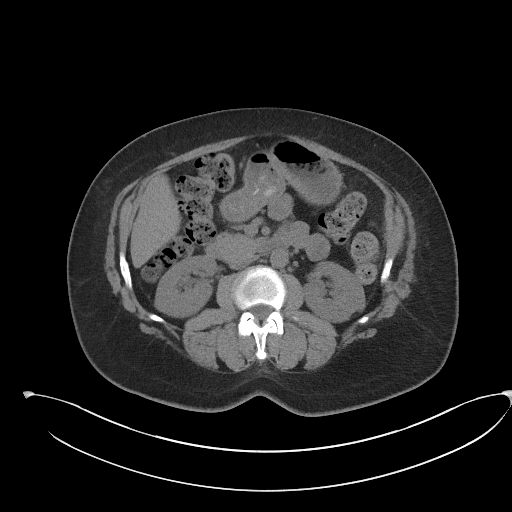
[im 71/88  soft-tissue]
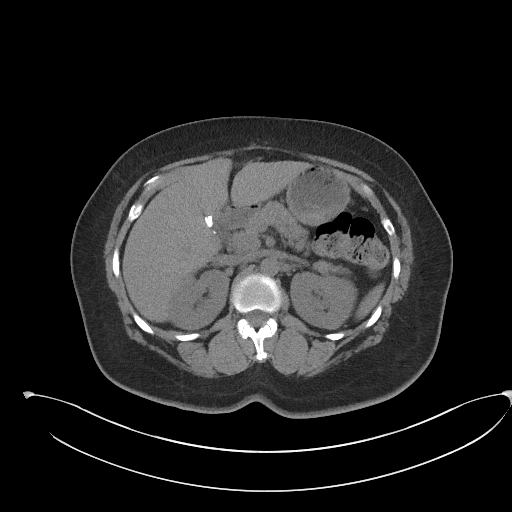
[im 77/88  soft-tissue]
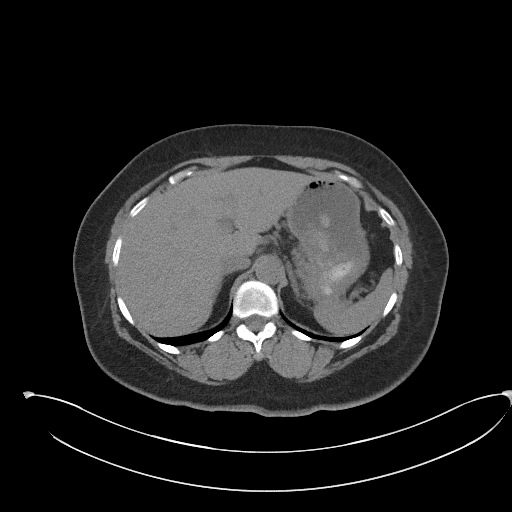
[im 84/88  soft-tissue]
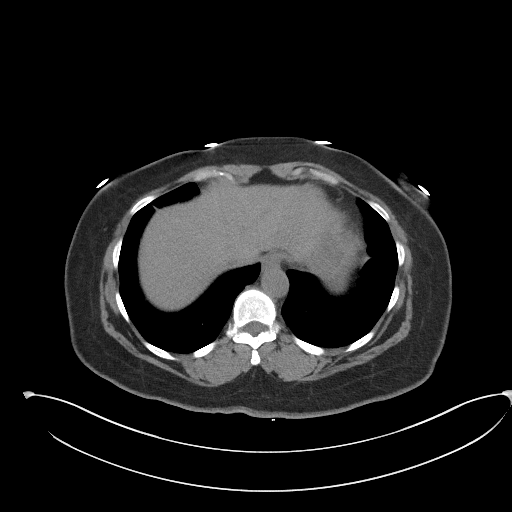

[Series 5: coronal st · coronal · 0.82mm/px · 3 of 92 slices shown]
[im 31/92  soft-tissue]
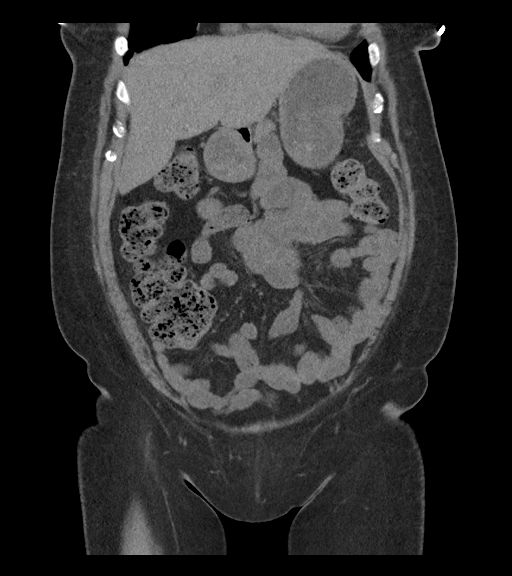
[im 41/92  soft-tissue]
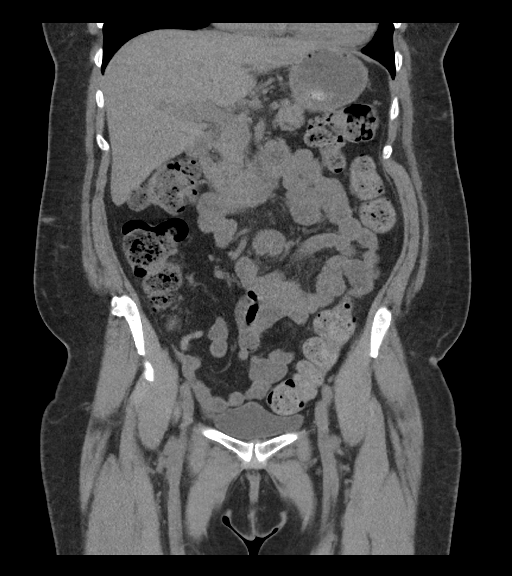
[im 51/92  soft-tissue]
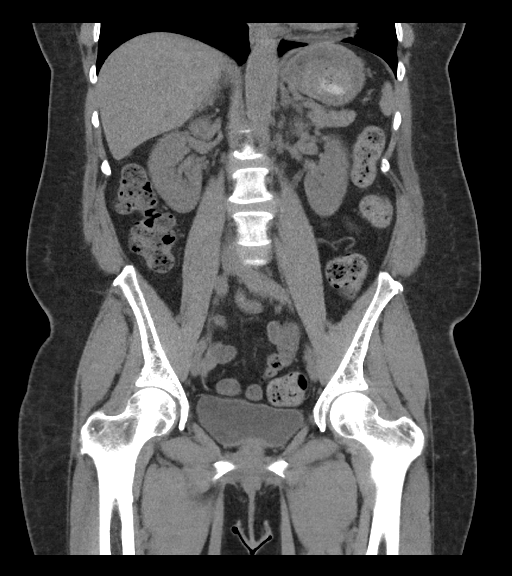

[17 of 46 positions shown; findings below may reference images not displayed]

FINDINGS: Please note that parenchymal abnormalities may be missed without
intravenous contrast.

Lower chest: No acute abnormality.

Hepatobiliary: Liver is unremarkable. The patient is status post
cholecystectomy. No biliary dilatation.

Pancreas: Unremarkable

Spleen: Unremarkable

Adrenals/Urinary Tract: Punctate nonobstructing bilateral renal
calculi are identified, 2 on the RIGHT and 3 on the LEFT. There is
no evidence of hydronephrosis or obstructing urinary calculi.

The adrenal glands and bladder are unremarkable.

Stomach/Bowel: Stomach is within normal limits. Appendix appears
normal. No evidence of bowel wall thickening, distention, or
inflammatory changes.

Vascular/Lymphatic: No significant vascular findings are present. No
enlarged abdominal or pelvic lymph nodes.

Reproductive: Status post hysterectomy. No adnexal masses.

Other: No ascites, focal collection or pneumoperitoneum.

Musculoskeletal: No acute or suspicious bony abnormality. Moderate
degenerative disc disease at L5-S1 noted.
IMPRESSION: 1. No evidence of acute abnormality.
2. Punctate nonobstructing bilateral renal calculi.

## 2019-07-23 MED ORDER — PHENAZOPYRIDINE HCL 200 MG PO TABS
200.0000 mg | ORAL_TABLET | Freq: Three times a day (TID) | ORAL | 0 refills | Status: DC | PRN
Start: 2019-07-23 — End: 2019-08-08

## 2019-07-23 NOTE — ED Triage Notes (Signed)
Pt reports hematuria since 7/5. Seen at [facility] and dx with UTI. Symptoms persist. Denies pain

## 2019-07-23 NOTE — ED Notes (Signed)
Patient transported to CT 

## 2019-07-23 NOTE — ED Notes (Signed)
ED Provider at bedside discussing test results and dispo plan of care. 

## 2019-07-24 LAB — URINE CULTURE: Culture: NO GROWTH

## 2019-07-27 ENCOUNTER — Other Ambulatory Visit: Payer: Self-pay | Admitting: Gastroenterology

## 2019-07-27 ENCOUNTER — Ambulatory Visit (HOSPITAL_COMMUNITY)
Admission: RE | Admit: 2019-07-27 | Discharge: 2019-07-27 | Disposition: A | Discharge status: Home / Self Care | Source: Ambulatory Visit | Attending: Interventional Radiology | Admitting: Interventional Radiology

## 2019-07-27 ENCOUNTER — Other Ambulatory Visit: Payer: Self-pay

## 2019-07-27 DIAGNOSIS — R1084 Generalized abdominal pain: Secondary | ICD-10-CM

## 2019-07-27 DIAGNOSIS — K219 Gastro-esophageal reflux disease without esophagitis: Secondary | ICD-10-CM

## 2019-07-27 DIAGNOSIS — R634 Abnormal weight loss: Secondary | ICD-10-CM

## 2019-07-27 DIAGNOSIS — R142 Eructation: Secondary | ICD-10-CM

## 2019-07-27 DIAGNOSIS — I671 Cerebral aneurysm, nonruptured: Secondary | ICD-10-CM

## 2019-07-27 NOTE — Consult Note (Signed)
Chief Complaint: Patient was seen in consultation today for No chief complaint on file.  at the request of Patt Steinhardt  Referring Physician(s): Naela Nodal  History of Present Illness: Tammy Rodgers is a 59 y.o. 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  The patient is accompanied by her daughter.  Patient reports occasional headaches usually in the left frontoparietal region which have seemingly occurred 1 2 to 3 days lasting a few minutes and with all with oral Tylenol per  There is no associated symptoms of nausea vomiting, visual aberrations, motor or sensory or coordination difficulties.  The patient is able to maintain full come level consciousness during this headaches.  The patient also reports scattered areas of bruising primarily around the right mid to upper and posterior quadrants, and also he was a mild degree the right thigh region.  These are nontender and appear not to be associated  with trauma.  Patient also reports symptoms of hematuria which began approximately a week ago.  Initially they were associate with mild dysuria.  The patient reports having visited the ER twice initially being started on antibiotics site which were then [place] as per the patient by the ER physician for Tammy Rodgers has been referred to see a urologist for persistent hematuria.  This has been scheduled for the last week of this month.  Patient has also been visiting her primary care who has been checking her hemoglobin, the most recent being approximately 11.5.. No significant pain associated during urination.  No history of fever chills or rigors.  Patient continues to adequate appetite with no recent weight loss.  He has started mild exercises which Tammy Rodgers is able to tolerate so far.   Past Medical History:  Diagnosis Date  . Anemia   . Anxiety   .  Complex partial seizure disorder ([place])    last seizure 09/2012  . GERD (gastroesophageal reflux disease)   . High cholesterol   . Hypertension    states BP is up and down; has been on med. x 2 mos.  . 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 Pre-diabetes   . Seizures ([place])   . Sleep apnea    uses CPAP nightly    Past Surgical History:  Procedure Laterality Date  . ABDOMINAL HYSTERECTOMY  12/13/2002   partial  . BREAST LUMPECTOMY WITH NEEDLE LOCALIZATION Left 11/24/2012   Procedure: LEFT BREAST LUMPECTOMY WITH NEEDLE LOCALIZATION;  Surgeon: Harl Bowie, MD;  Location: [facility];  Service: General;  Laterality: Left;  . CHOLECYSTECTOMY    . COLONOSCOPY    . IR 3D INDEPENDENT WKST  07/11/2019  . IR ANGIO INTRA EXTRACRAN SEL COM CAROTID INNOMINATE UNI R MOD SED  07/06/2019  . IR ANGIO INTRA EXTRACRAN SEL INTERNAL CAROTID UNI L MOD SED  07/06/2019  . IR ANGIO VERTEBRAL SEL VERTEBRAL UNI L MOD SED  07/06/2019  . IR ANGIOGRAM FOLLOW UP STUDY  07/06/2019  . IR TRANSCATH/EMBOLIZ  07/06/2019  . IR US GUIDE VASC ACCESS RIGHT  07/06/2019  . PARATHYROIDECTOMY     partial  . RADIOLOGY WITH ANESTHESIA N/A 07/06/2019   Procedure: IR WITH ANESTHESIA EMBLOLIZATION;  Surgeon: Luanne Bras, MD;  Location: [facility];  Service: Radiology;  Laterality: N/A;    Allergies: Iodinated diagnostic agents and Ritalin [methylphenidate hcl]  Medications: Prior to Admission medications   Medication Sig Start Date End Date Taking? Authorizing Provider  amLODipine (NORVASC) 10 MG tablet Take 10 mg by mouth daily.     [provider]  aspirin EC 81 MG tablet Take 81 mg by mouth daily.    [provider]  cephALEXin (KEFLEX) 500 MG capsule Take 1 capsule (500 mg total) by mouth 3 (three) times daily. 07/18/19   Horton, Barbette Hair, MD  cloBAZam (ONFI) 10 MG tablet Take 1 tablet (10 mg total) by mouth at bedtime. 05/09/19   Suzzanne Cloud, NP  clopidogrel (PLAVIX) 75 MG tablet Take 75  mg by mouth daily.    [provider]  diphenhydrAMINE (BENADRYL) 25 MG tablet Take 50 mg by mouth once. Take 2 tablets one hour before procedure    [provider]  ezetimibe-simvastatin (VYTORIN) 10-20 MG per tablet Take 1 tablet by mouth at bedtime.    [provider]  LAMICTAL 200 MG tablet TAKE 1 TABLET IN THE MORNING AND 1.5 TABLETS IN THE EVENING Patient taking differently: Take 200-300 mg by mouth See admin instructions. TAKE 1 TABLET IN THE MORNING AND 1.5 TABLETS IN THE EVENING 06/07/19   Marcial Pacas, MD  losartan-hydrochlorothiazide (HYZAAR) 50-12.5 MG tablet Take 1 tablet by mouth daily.     [provider]  metFORMIN (GLUCOPHAGE) 500 MG tablet Take 250 mg by mouth daily.     [provider]  Multiple Vitamins-Minerals (MULTIVITAMIN WITH MINERALS) tablet Take 1 tablet by mouth daily.    [provider]  omeprazole (PRILOSEC) 20 MG capsule Take 20 mg by mouth daily.    [provider]  phenazopyridine (PYRIDIUM) 200 MG tablet Take 1 tablet (200 mg total) by mouth 3 (three) times daily as needed for pain. 07/23/19   Virgel Manifold, MD  predniSONE (DELTASONE) 50 MG tablet Take 50 mg by mouth See admin instructions. Take 1 tablet by mouth 13 hours before procedure, 7 hours before procedure, and 1 hour before procedure for 3 doses    [provider]     Family History  Problem Relation Age of Onset  . Stroke Mother   . Heart attack Mother   . Diabetes Mother   . COPD Father   . Heart disease Other     Social History   Socioeconomic History  . Marital status: Married    Spouse name: Not on file  . Number of children: 2  . Years of education: Bachelors  . Highest education level: Not on file  Occupational History  . Occupation: math Product manager: [employer]  Tobacco Use  . Smoking status: Never Smoker  . Smokeless tobacco: Never Used  Vaping Use  . Vaping Use: Never used  Substance and  Sexual Activity  . Alcohol use: No  . Drug use: No  . Sexual activity: Not on file  Other Topics Concern  . Not on file  Social History Narrative   Lives at home with husband.   Right-handed.   No caffeine use.   Social Determinants of Health   Financial Resource Strain:   . Difficulty of Paying Living Expenses:   Food Insecurity:   . Worried About Charity fundraiser in the Last Year:   . Arboriculturist in the Last Year:   Transportation Needs:   . Film/video editor (Medical):   Marland Kitchen Lack of Transportation (Non-Medical):   Physical Activity:   . Days of Exercise per Week:   . Minutes of Exercise per Session:   Stress:   .  Feeling of Stress :   Social Connections:   . Frequency of Communication with Friends and Family:   . Frequency of Social Gatherings with Friends and Family:   . Attends Religious Services:   . Active Member of Clubs or Organizations:   . Attends Archivist Meetings:   Marland Kitchen Marital Status:     Review of Systems: A 12 point ROS discussed and pertinent positives are indicated in the HPI above.  All other systems are negative.  Review of Systems  Respiratory: Negative for cough, chest tightness and shortness of breath.   Cardiovascular: Negative for chest pain and leg swelling.    Vital Signs: There were no vitals taken for this visit.  Physical Exam Alert awake oriented to time place space.  Affect appropriate.  Speech normal.  No gross facial asymmetry.  Tongue in midline.  Patient moving all fours equally.  Grossly, coordination and Station and gait is within normal months.   Imaging: IR Transcath/Emboliz  Result Date: 07/08/2019 CLINICAL DATA:  Patient with history of ischemic stroke. 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 EXAM: TRANSCATHETER THERAPY EMBOLIZATION COMPARISON:  CT angiogram of the head and neck of May 07, 2019. MEDICATIONS: Heparin 4,000 units IV. Ancef 2 g IV antibiotic was administered within 1 hour of the procedure. ANESTHESIA/SEDATION: General anesthesia CONTRAST:  Isovue 300 approximately 75 mL FLUOROSCOPY TIME:  Fluoroscopy Time: 47 minutes 30 seconds (2986 mGy). COMPLICATIONS: None immediate. TECHNIQUE: Informed written consent was obtained from the patient after a thorough discussion of the procedural risks, benefits and alternatives. All questions were addressed. Maximal Sterile Barrier Technique was utilized including caps, mask, sterile gowns, sterile gloves, sterile drape, hand hygiene and skin antiseptic. A timeout was performed prior to the initiation of the procedure. The right forearm to the wrist was prepped and draped in the usual sterile manner. The right radial artery was then identified and its morphology documented with ultrasound examination. A dorsal palmar anastomosis was verified to be present. Using ultrasound guidance and a micropuncture set access into the right radial artery was obtained without difficulty. Over a 0.018 inch micro guidewire, a 6/7 French radial sheath was then inserted. The obturator and the micro guidewire were removed. Good aspiration was obtained from the side port of the sheath. A cocktail of 2000 units of heparin, 2.5 mg of verapamil and 200 mcg of nitroglycerin was then infused in diluted form without event. A right radial arteriogram was obtained. Over a 0.035 inch Roadrunner guidewire, a 5 [place] Simmons 2 diagnostic catheter was advanced to the aortic arch region, and cannulations were performed of the left vertebral artery, the right common artery and the left common carotid artery. Arteriograms were obtained in these vessels. Also obtained was a 3D rotational arteriogram intracranially from the left common carotid artery. 3D reconstruction was then performed of the left anterior circulation. FINDINGS: The left vertebral artery origin is widely patent. The  vessel is seen to opacify to the cranial skull base. Wide patency is seen of the left posterior-inferior cerebellar artery and the left vertebrobasilar junction. The opacified portion of the basilar artery, the posterior cerebral arteries, the superior cerebellar arteries and the anterior-inferior cerebellar arteries is noted into the capillary and venous phases. Unopacified blood is seen in the basilar artery from the contralateral vertebral artery. The innominate arteriogram demonstrates the origin of the right subclavian artery and the right common carotid artery  to be widely patent. The origin of the right vertebral artery is widely patent. The vessel is seen to opacify to the cranial skull base. The right common carotid arteriogram demonstrates the right external carotid artery and its major branches to be widely patent. The right internal carotid artery at the bulb to the cranial skull base is widely patent. The petrous, cavernous and supraclinoid segments are widely patent. A right posterior communicating artery is seen opacifying the right posterior cerebral artery distribution. The right middle cerebral artery and the right anterior cerebral artery opacify into the capillary and venous phases. The left common carotid arteriogram demonstrates the left external carotid artery and its major branches to be widely patent. The left internal carotid artery at the bulb to the cranial skull base also demonstrates wide patency. The petrous, the cavernous and the supraclinoid segments are widely patent. A left posterior communicating artery is seen opacifying the left posterior cerebral artery territory. 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 The 3D reconstruction images demonstrate the aneurysm to arise from the upper most proximal aspect of the left posterior communicating artery, just  at its origin from the internal carotid artery. The left middle cerebral artery and the left anterior cerebral artery opacify into the capillary and venous phases. ENDOVASCULAR EMBOLIZATION OF WIDE NECK LEFT INTERNAL CAROTID ARTERY, POSTERIOR COMMUNICATING ARTERY REGION ANEURYSM. The diagnostic 5 French Simmons 2 catheter in the left common carotid artery was then exchanged over a 0.035 inch 300 cm Rosen exchange guidewire for a 95 cm 7 French RIST sheath. Guidewire was removed. Good aspiration obtained from the 7 French RIST sheath. A gentle control arteriogram performed through this demonstrated no evidence of spasms, dissections or of intraluminal filling defects. Over a 0.035 inch Roadrunner guidewire, a 5 French 115 cm Catalyst guide catheter was then advanced to the horizontal petrous segment of the left internal carotid artery, with the advancement of the 7 French sheath to the cervical petrous junction. The guidewire was removed. Good aspiration obtained from the hub of the 5 [place] Catalyst guide catheter in the left internal carotid artery. Control arteriogram demonstrated no evidence of intraluminal filling defects or of occlusions. Following measurements of the left internal carotid artery at the terminus, and the distal cavernous segment of the left internal carotid artery of the landing zones, it was decided to use a 3.5 mm x 12 mm pipeline flex flow diverter device for embolization treatment. Over a 0.014 inch standard Synchro micro guidewire with a J-tip configuration, a 150 cm 027 Phenom microcatheter was advanced to the distal end of the Catalyst guide catheter. With the micro guidewire leading with a J-tip configuration, the combination was navigated without difficulty with a torque device to the M2 M3 region of the inferior division of the left middle cerebral artery. The guidewire was removed. Good aspiration obtained from the Phenom microcatheter. Gentle control arteriogram performed through  this demonstrated safe position of tip of the microcatheter which was then connected to continuous heparinized saline infusion. The housing of the 3.5 mm x 12 mm device was then purged with heparinized saline infusion. Under intermittent fluoroscopy, this was then advanced to the distal end of the microcatheter. The O ring on the delivery microcatheter was loosened. With slight forward gentle traction with the right hand on the delivery micro guidewire, with the left hand the delivery microcatheter was unsheathed unsheathing the  distal wire, and also the distal few mm of the device. The cigar configuration changed through expansion of the device distally. The combination was then retrieved to the distal landing zone which was at the left internal carotid artery terminus. Thereafter, the micro guidewire was then advanced unsheathing the device distally. More of the device was then deployed in the manner described advancing the micro guidewire, and maintaining the microcatheter centralized in the native lumen. Once the entire device have been deployed, the microcatheter was gently advanced through the device and then retrieved. Control arteriogram performed through the Catalyst guide catheter demonstrated excellent apposition and deployment of the device with stasis being seen in the aneurysm itself. Under constant fluoroscopic guidance, the delivery micro guidewire and the catheter were retrieved and removed. A control arteriogram performed through the 5 [place] Catalyst guide catheter at 15 and 30 minutes post deployment continued to demonstrate excellent flow through the device with partial stasis in the aneurysm itself. Patency of the left posterior communicating artery was maintained. A final control arteriogram performed through the RIST sheath demonstrated excellent flow through the internal carotid artery and the through the device. No evidence of intraluminal filling defects or of occlusion was seen. The left  internal carotid artery extra cranially remained widely patent also. The 7 French long sheath was then retrieved and removed. The 6/7 radial sheath was then removed with the successful application of a wrist band for hemostasis in the right radial artery puncture site. Distal right radial pulse was verified to be present. Prior to removal, the patient was also given a total of 4.5 mg of Integrilin intra-arterially to ensure no platelet aggregation in the device itself. The patient's general anesthesia was then reversed and the patient was then extubated without difficulty. Upon recovery, the patient complained of a sore throat but, otherwise, remained oriented with normal speech without any motor abnormalities. Tammy Rodgers was then transferred to the PACU and then to the neuro ICU to continue on low-dose IV heparin, close monitoring of her blood pressure, and close neurological checks. Overnight the patient exhibited no complications. The following morning, the IV heparin was stopped and the patient was switched to aspirin 325 mg a day, and Plavix 75 mg a day. The right wrist puncture site remained soft with the right radial artery pulse being present. Tammy Rodgers was then mobilized independently and able to tolerate overall intake. Tammy Rodgers was then discharged home under the care of family with special instructions to maintain adequate hydration, to take her medications without delay. Tammy Rodgers was advised to refrain from driving for 2 weeks, avoid stooping, bending or lifting weights above 10 pounds. Tammy Rodgers was advised also to maintain adequate hydration by drinking water. Should Tammy Rodgers develop any symptoms of gait instability, motor symptoms, speech difficulties or vision symptoms Tammy Rodgers was advised to call 911. Tammy Rodgers expressed understanding and agreement with the above management plan. 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 PLAN: Follow-up in the clinic 2 weeks post discharge. Electronically Signed   By: Luanne Bras M.D.   On: 07/07/2019 15:47   IR Angiogram Follow Up Study  Result Date: 07/08/2019 CLINICAL DATA:  Patient with history of ischemic stroke. 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 EXAM: TRANSCATHETER THERAPY EMBOLIZATION COMPARISON:  CT angiogram of the head and neck of May 07, 2019. MEDICATIONS: Heparin 4,000 units IV. Ancef 2 g IV antibiotic was administered within 1 hour of the procedure. ANESTHESIA/SEDATION: General anesthesia CONTRAST:  Isovue 300 approximately 75 mL FLUOROSCOPY TIME:  Fluoroscopy Time: 47 minutes 30 seconds (2986 mGy). COMPLICATIONS: None immediate. TECHNIQUE: Informed written consent was obtained from the patient after a thorough discussion of the procedural risks, benefits and alternatives. All questions were addressed. Maximal Sterile Barrier Technique was utilized including caps, mask, sterile gowns, sterile gloves, sterile drape, hand hygiene and skin antiseptic. A timeout was performed prior to the initiation of the procedure. The right forearm to the wrist was prepped and draped in the usual sterile manner. The right radial artery was then identified and its morphology documented with ultrasound examination. A dorsal palmar anastomosis was verified to be present. Using ultrasound guidance and a micropuncture set access into the right radial artery was obtained without difficulty. Over a 0.018 inch micro guidewire, a 6/7 French radial sheath was then inserted. The obturator and the micro guidewire were removed. Good aspiration was obtained from the side port of the sheath. A cocktail of 2000 units of heparin, 2.5 mg of verapamil and 200 mcg of nitroglycerin was then infused in diluted form without event. A right radial arteriogram was obtained. Over  a 0.035 inch Roadrunner guidewire, a 5 [place] Simmons 2 diagnostic catheter was advanced to the aortic arch region, and cannulations were performed of the left vertebral artery, the right common artery and the left common carotid artery. Arteriograms were obtained in these vessels. Also obtained was a 3D rotational arteriogram intracranially from the left common carotid artery. 3D reconstruction was then performed of the left anterior circulation. FINDINGS: The left vertebral artery origin is widely patent. The vessel is seen to opacify to the cranial skull base. Wide patency is seen of the left posterior-inferior cerebellar artery and the left vertebrobasilar junction. The opacified portion of the basilar artery, the posterior cerebral arteries, the superior cerebellar arteries and the anterior-inferior cerebellar arteries is noted into the capillary and venous phases. Unopacified blood is seen in the basilar artery from the contralateral vertebral artery. The innominate arteriogram demonstrates the origin of the right subclavian artery and the right common carotid artery to be widely patent. The origin of the right vertebral artery is widely patent. The vessel is seen to opacify to the cranial skull base. The right common carotid arteriogram demonstrates the right external carotid artery and its major branches to be widely patent. The right internal carotid artery at the bulb to the cranial skull base is widely patent. The petrous, cavernous and supraclinoid segments are widely patent. A right posterior communicating artery is seen opacifying the right posterior cerebral artery distribution. The right middle cerebral artery and the right anterior cerebral artery opacify into the capillary and venous phases. The left common carotid arteriogram demonstrates the left external carotid artery and its major branches to be widely patent. The left internal carotid artery at the bulb to the cranial skull base also  demonstrates wide patency. The petrous, the cavernous and the supraclinoid segments are widely patent. A left posterior communicating artery is seen opacifying the left posterior cerebral artery territory. 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 The 3D reconstruction images demonstrate the aneurysm to arise from the upper most proximal aspect of the left posterior communicating artery, just at its origin from the internal carotid  artery. The left middle cerebral artery and the left anterior cerebral artery opacify into the capillary and venous phases. ENDOVASCULAR EMBOLIZATION OF WIDE NECK LEFT INTERNAL CAROTID ARTERY, POSTERIOR COMMUNICATING ARTERY REGION ANEURYSM. The diagnostic 5 French Simmons 2 catheter in the left common carotid artery was then exchanged over a 0.035 inch 300 cm Rosen exchange guidewire for a 95 cm 7 French RIST sheath. Guidewire was removed. Good aspiration obtained from the 7 French RIST sheath. A gentle control arteriogram performed through this demonstrated no evidence of spasms, dissections or of intraluminal filling defects. Over a 0.035 inch Roadrunner guidewire, a 5 French 115 cm Catalyst guide catheter was then advanced to the horizontal petrous segment of the left internal carotid artery, with the advancement of the 7 French sheath to the cervical petrous junction. The guidewire was removed. Good aspiration obtained from the hub of the 5 [place] Catalyst guide catheter in the left internal carotid artery. Control arteriogram demonstrated no evidence of intraluminal filling defects or of occlusions. Following measurements of the left internal carotid artery at the terminus, and the distal cavernous segment of the left internal carotid artery of the landing zones, it was decided to use a 3.5 mm x 12 mm pipeline flex flow diverter device for embolization  treatment. Over a 0.014 inch standard Synchro micro guidewire with a J-tip configuration, a 150 cm 027 Phenom microcatheter was advanced to the distal end of the Catalyst guide catheter. With the micro guidewire leading with a J-tip configuration, the combination was navigated without difficulty with a torque device to the M2 M3 region of the inferior division of the left middle cerebral artery. The guidewire was removed. Good aspiration obtained from the Phenom microcatheter. Gentle control arteriogram performed through this demonstrated safe position of tip of the microcatheter which was then connected to continuous heparinized saline infusion. The housing of the 3.5 mm x 12 mm device was then purged with heparinized saline infusion. Under intermittent fluoroscopy, this was then advanced to the distal end of the microcatheter. The O ring on the delivery microcatheter was loosened. With slight forward gentle traction with the right hand on the delivery micro guidewire, with the left hand the delivery microcatheter was unsheathed unsheathing the distal wire, and also the distal few mm of the device. The cigar configuration changed through expansion of the device distally. The combination was then retrieved to the distal landing zone which was at the left internal carotid artery terminus. Thereafter, the micro guidewire was then advanced unsheathing the device distally. More of the device was then deployed in the manner described advancing the micro guidewire, and maintaining the microcatheter centralized in the native lumen. Once the entire device have been deployed, the microcatheter was gently advanced through the device and then retrieved. Control arteriogram performed through the Catalyst guide catheter demonstrated excellent apposition and deployment of the device with stasis being seen in the aneurysm itself. Under constant fluoroscopic guidance, the delivery micro guidewire and the catheter were retrieved and  removed. A control arteriogram performed through the 5 [place] Catalyst guide catheter at 15 and 30 minutes post deployment continued to demonstrate excellent flow through the device with partial stasis in the aneurysm itself. Patency of the left posterior communicating artery was maintained. A final control arteriogram performed through the RIST sheath demonstrated excellent flow through the internal carotid artery and the through the device. No evidence of intraluminal filling defects or of occlusion was seen. The left internal carotid artery extra cranially remained widely patent also. The  7 [place] long sheath was then retrieved and removed. The 6/7 radial sheath was then removed with the successful application of a wrist band for hemostasis in the right radial artery puncture site. Distal right radial pulse was verified to be present. Prior to removal, the patient was also given a total of 4.5 mg of Integrilin intra-arterially to ensure no platelet aggregation in the device itself. The patient's general anesthesia was then reversed and the patient was then extubated without difficulty. Upon recovery, the patient complained of a sore throat but, otherwise, remained oriented with normal speech without any motor abnormalities. Tammy Rodgers was then transferred to the PACU and then to the neuro ICU to continue on low-dose IV heparin, close monitoring of her blood pressure, and close neurological checks. Overnight the patient exhibited no complications. The following morning, the IV heparin was stopped and the patient was switched to aspirin 325 mg a day, and Plavix 75 mg a day. The right wrist puncture site remained soft with the right radial artery pulse being present. Tammy Rodgers was then mobilized independently and able to tolerate overall intake. Tammy Rodgers was then discharged home under the care of family with special instructions to maintain adequate hydration, to take her medications without delay. Tammy Rodgers was advised to refrain from  driving for 2 weeks, avoid stooping, bending or lifting weights above 10 pounds. Tammy Rodgers was advised also to maintain adequate hydration by drinking water. Should Tammy Rodgers develop any symptoms of gait instability, motor symptoms, speech difficulties or vision symptoms Tammy Rodgers was advised to call 911. Tammy Rodgers expressed understanding and agreement with the above management plan. 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 PLAN: Follow-up in the clinic 2 weeks post discharge. Electronically Signed   By: Luanne Bras M.D.   On: 07/07/2019 15:47   IR 3D Independent Darreld Mclean  Result Date: 07/11/2019 CLINICAL DATA:  Patient with history of ischemic stroke. 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  EXAM: TRANSCATHETER THERAPY EMBOLIZATION  COMPARISON:  CT angiogram of the head and neck of May 07, 2019.  MEDICATIONS: Heparin 4,000 units IV. Ancef 2 g IV antibiotic was administered within 1 hour of the procedure.  ANESTHESIA/SEDATION: General anesthesia  CONTRAST:  Isovue 300 approximately 75 mL  FLUOROSCOPY TIME:  Fluoroscopy Time: 47 minutes 30 seconds (2986 mGy).  COMPLICATIONS: None immediate.  TECHNIQUE: Informed written consent was obtained from the patient after a thorough discussion of the procedural risks, benefits and alternatives. All questions were addressed. Maximal Sterile Barrier Technique was utilized including caps, mask, sterile gowns, sterile gloves, sterile drape, hand hygiene and skin antiseptic. A timeout was performed prior to the initiation of the procedure.  The right forearm to the wrist was prepped and draped in the usual sterile manner. The right radial artery was then identified and its morphology documented with ultrasound examination.  A dorsal  palmar anastomosis was verified to be present. Using ultrasound guidance and a micropuncture set access into the right radial artery was obtained without difficulty. Over a 0.018 inch micro guidewire, a 6/7 French radial sheath was then inserted. The obturator and the micro guidewire were removed. Good aspiration was obtained from the side port of the sheath. A cocktail of 2000 units of heparin, 2.5 mg of verapamil and 200 mcg of nitroglycerin was then infused in diluted form without  event. A right radial arteriogram was obtained.  Over a 0.035 inch Roadrunner guidewire, a 5 [place] Simmons 2 diagnostic catheter was advanced to the aortic arch region, and cannulations were performed of the left vertebral artery, the right common artery and the left common carotid artery. Arteriograms were obtained in these vessels. Also obtained was a 3D rotational arteriogram intracranially from the left common carotid artery. 3D reconstruction was then performed of the left anterior circulation.  FINDINGS: The left vertebral artery origin is widely patent.  The vessel is seen to opacify to the cranial skull base. Wide patency is seen of the left posterior-inferior cerebellar artery and the left vertebrobasilar junction.  The opacified portion of the basilar artery, the posterior cerebral arteries, the superior cerebellar arteries and the anterior-inferior cerebellar arteries is noted into the capillary and venous phases. Unopacified blood is seen in the basilar artery from the contralateral vertebral artery.  The innominate arteriogram demonstrates the origin of the right subclavian artery and the right common carotid artery to be widely patent.  The origin of the right vertebral artery is widely patent. The vessel is seen to opacify to the cranial skull base.  The right common carotid arteriogram demonstrates the right external carotid artery and its major branches to be widely patent.  The right internal carotid artery at  the bulb to the cranial skull base is widely patent.  The petrous, cavernous and supraclinoid segments are widely patent.  A right posterior communicating artery is seen opacifying the right posterior cerebral artery distribution.  The right middle cerebral artery and the right anterior cerebral artery opacify into the capillary and venous phases.  The left common carotid arteriogram demonstrates the left external carotid artery and its major branches to be widely patent.  The left internal carotid artery at the bulb to the cranial skull base also demonstrates wide patency. The petrous, the cavernous and the supraclinoid segments are widely patent.  A left posterior communicating artery is seen opacifying the left posterior cerebral artery territory.  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 The 3D reconstruction images demonstrate the aneurysm to arise from the upper most proximal aspect of the left posterior communicating artery, just at its origin from the internal carotid artery.  The left middle cerebral artery and the left anterior cerebral artery opacify into the capillary and venous phases.  ENDOVASCULAR EMBOLIZATION OF WIDE NECK LEFT INTERNAL CAROTID ARTERY, POSTERIOR COMMUNICATING ARTERY REGION ANEURYSM.  The diagnostic 5 [place] Simmons 2 catheter in the left common carotid artery was then exchanged over a 0.035 inch 300 cm Rosen exchange guidewire for a 95 cm 7 French RIST sheath. Guidewire was removed. Good aspiration obtained from the 7 French RIST sheath. A gentle control arteriogram performed through this demonstrated no evidence of spasms, dissections or of intraluminal filling defects. Over a 0.035 inch Roadrunner guidewire, a 5 [place] 115 cm Catalyst guide catheter was then advanced to the horizontal petrous segment of the left internal carotid artery, with  the advancement of the 7 French sheath to the cervical petrous junction.  The guidewire was removed. Good aspiration obtained from the hub of the 5 [place] Catalyst guide catheter in the left internal carotid artery. Control arteriogram demonstrated no evidence of intraluminal filling defects or of occlusions.  Following measurements of the left internal carotid artery at the terminus, and the distal cavernous segment of the  left internal carotid artery of the landing zones, it was decided to use a 3.5 mm x 12 mm pipeline flex flow diverter device for embolization treatment. Over a 0.014 inch standard Synchro micro guidewire with a J-tip configuration, a 150 cm 027 Phenom microcatheter was advanced to the distal end of the Catalyst guide catheter.  With the micro guidewire leading with a J-tip configuration, the combination was navigated without difficulty with a torque device to the M2 M3 region of the inferior division of the left middle cerebral artery.  The guidewire was removed. Good aspiration obtained from the Phenom microcatheter. Gentle control arteriogram performed through this demonstrated safe position of tip of the microcatheter which was then connected to continuous heparinized saline infusion.  The housing of the 3.5 mm x 12 mm device was then purged with heparinized saline infusion.  Under intermittent fluoroscopy, this was then advanced to the distal end of the microcatheter. The O ring on the delivery microcatheter was loosened. With slight forward gentle traction with the right hand on the delivery micro guidewire, with the left hand the delivery microcatheter was unsheathed unsheathing the distal wire, and also the distal few mm of the device.  The cigar configuration changed through expansion of the device distally.  The combination was then retrieved to the distal landing zone which was at the left internal carotid artery terminus. Thereafter, the micro guidewire was then advanced  unsheathing the device distally. More of the device was then deployed in the manner described advancing the micro guidewire, and maintaining the microcatheter centralized in the native lumen.  Once the entire device have been deployed, the microcatheter was gently advanced through the device and then retrieved.  Control arteriogram performed through the Catalyst guide catheter demonstrated excellent apposition and deployment of the device with stasis being seen in the aneurysm itself.  Under constant fluoroscopic guidance, the delivery micro guidewire and the catheter were retrieved and removed. A control arteriogram performed through the 5 [place] Catalyst guide catheter at 15 and 30 minutes post deployment continued to demonstrate excellent flow through the device with partial stasis in the aneurysm itself. Patency of the left posterior communicating artery was maintained.  A final control arteriogram performed through the RIST sheath demonstrated excellent flow through the internal carotid artery and the through the device. No evidence of intraluminal filling defects or of occlusion was seen.  The left internal carotid artery extra cranially remained widely patent also.  The 7 French long sheath was then retrieved and removed.  The 6/7 radial sheath was then removed with the successful application of a wrist band for hemostasis in the right radial artery puncture site. Distal right radial pulse was verified to be present.  Prior to removal, the patient was also given a total of 4.5 mg of Integrilin intra-arterially to ensure no platelet aggregation in the device itself.  The patient's general anesthesia was then reversed and the patient was then extubated without difficulty. Upon recovery, the patient complained of a sore throat but, otherwise, remained oriented with normal speech without any motor abnormalities.  Tammy Rodgers was then transferred to the PACU and then to the neuro ICU to continue on low-dose IV  heparin, close monitoring of her blood pressure, and close neurological checks. Overnight the patient exhibited no complications. The following morning, the IV heparin was stopped and the patient was switched to aspirin 325 mg a day, and Plavix 75 mg a day. The right wrist puncture site remained soft with the right radial artery  pulse being present.  Tammy Rodgers was then mobilized independently and able to tolerate overall intake. Tammy Rodgers was then discharged home under the care of family with special instructions to maintain adequate hydration, to take her medications without delay.  Tammy Rodgers was advised to refrain from driving for 2 weeks, avoid stooping, bending or lifting weights above 10 pounds.  Tammy Rodgers was advised also to maintain adequate hydration by drinking water.  Should Tammy Rodgers develop any symptoms of gait instability, motor symptoms, speech difficulties or vision symptoms Tammy Rodgers was advised to call 911.  Tammy Rodgers expressed understanding and agreement with the above management plan.  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  PLAN: Follow-up in the clinic 2 weeks post discharge.   Electronically Signed   By: Luanne Bras M.D.   On: 07/07/2019 15:47   IR US Guide Vasc Access Right  Result Date: 07/08/2019 CLINICAL DATA:  Patient with history of ischemic stroke. 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 EXAM: TRANSCATHETER THERAPY EMBOLIZATION COMPARISON:  CT angiogram of the head and neck of May 07, 2019. MEDICATIONS: Heparin 4,000 units IV. Ancef 2 g IV antibiotic was administered within 1 hour of the procedure. ANESTHESIA/SEDATION: General anesthesia CONTRAST:  Isovue 300 approximately 75 mL FLUOROSCOPY TIME:  Fluoroscopy Time: 47 minutes 30 seconds (2986 mGy).  COMPLICATIONS: None immediate. TECHNIQUE: Informed written consent was obtained from the patient after a thorough discussion of the procedural risks, benefits and alternatives. All questions were addressed. Maximal Sterile Barrier Technique was utilized including caps, mask, sterile gowns, sterile gloves, sterile drape, hand hygiene and skin antiseptic. A timeout was performed prior to the initiation of the procedure. The right forearm to the wrist was prepped and draped in the usual sterile manner. The right radial artery was then identified and its morphology documented with ultrasound examination. A dorsal palmar anastomosis was verified to be present. Using ultrasound guidance and a micropuncture set access into the right radial artery was obtained without difficulty. Over a 0.018 inch micro guidewire, a 6/7 French radial sheath was then inserted. The obturator and the micro guidewire were removed. Good aspiration was obtained from the side port of the sheath. A cocktail of 2000 units of heparin, 2.5 mg of verapamil and 200 mcg of nitroglycerin was then infused in diluted form without event. A right radial arteriogram was obtained. Over a 0.035 inch Roadrunner guidewire, a 5 [place] Simmons 2 diagnostic catheter was advanced to the aortic arch region, and cannulations were performed of the left vertebral artery, the right common artery and the left common carotid artery. Arteriograms were obtained in these vessels. Also obtained was a 3D rotational arteriogram intracranially from the left common carotid artery. 3D reconstruction was then performed of the left anterior circulation. FINDINGS: The left vertebral artery origin is widely patent. The vessel is seen to opacify to the cranial skull base. Wide patency is seen of the left posterior-inferior cerebellar artery and the left vertebrobasilar junction. The opacified portion of the basilar artery, the posterior cerebral arteries, the superior cerebellar arteries  and the anterior-inferior cerebellar arteries is noted into the capillary and venous phases. Unopacified blood is seen in the basilar artery from the contralateral vertebral artery. The innominate arteriogram demonstrates the origin of the right subclavian artery and the right common carotid artery to be widely patent.  The origin of the right vertebral artery is widely patent. The vessel is seen to opacify to the cranial skull base. The right common carotid arteriogram demonstrates the right external carotid artery and its major branches to be widely patent. The right internal carotid artery at the bulb to the cranial skull base is widely patent. The petrous, cavernous and supraclinoid segments are widely patent. A right posterior communicating artery is seen opacifying the right posterior cerebral artery distribution. The right middle cerebral artery and the right anterior cerebral artery opacify into the capillary and venous phases. The left common carotid arteriogram demonstrates the left external carotid artery and its major branches to be widely patent. The left internal carotid artery at the bulb to the cranial skull base also demonstrates wide patency. The petrous, the cavernous and the supraclinoid segments are widely patent. A left posterior communicating artery is seen opacifying the left posterior cerebral artery territory. 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 The 3D reconstruction images demonstrate the aneurysm to arise from the upper most proximal aspect of the left posterior communicating artery, just at its origin from the internal carotid artery. The left middle cerebral artery and the left anterior cerebral artery opacify into the capillary and venous phases. ENDOVASCULAR EMBOLIZATION OF WIDE NECK LEFT INTERNAL CAROTID ARTERY, POSTERIOR COMMUNICATING ARTERY REGION  ANEURYSM. The diagnostic 5 French Simmons 2 catheter in the left common carotid artery was then exchanged over a 0.035 inch 300 cm Rosen exchange guidewire for a 95 cm 7 French RIST sheath. Guidewire was removed. Good aspiration obtained from the 7 French RIST sheath. A gentle control arteriogram performed through this demonstrated no evidence of spasms, dissections or of intraluminal filling defects. Over a 0.035 inch Roadrunner guidewire, a 5 [place] 115 cm Catalyst guide catheter was then advanced to the horizontal petrous segment of the left internal carotid artery, with the advancement of the 7 French sheath to the cervical petrous junction. The guidewire was removed. Good aspiration obtained from the hub of the 5 [place] Catalyst guide catheter in the left internal carotid artery. Control arteriogram demonstrated no evidence of intraluminal filling defects or of occlusions. Following measurements of the left internal carotid artery at the terminus, and the distal cavernous segment of the left internal carotid artery of the landing zones, it was decided to use a 3.5 mm x 12 mm pipeline flex flow diverter device for embolization treatment. Over a 0.014 inch standard Synchro micro guidewire with a J-tip configuration, a 150 cm 027 Phenom microcatheter was advanced to the distal end of the Catalyst guide catheter. With the micro guidewire leading with a J-tip configuration, the combination was navigated without difficulty with a torque device to the M2 M3 region of the inferior division of the left middle cerebral artery. The guidewire was removed. Good aspiration obtained from the Phenom microcatheter. Gentle control arteriogram performed through this demonstrated safe position of tip of the microcatheter which was then connected to continuous heparinized saline infusion. The housing of the 3.5 mm x 12 mm device was then purged with heparinized saline infusion. Under intermittent fluoroscopy, this was then advanced  to the distal end of the microcatheter. The O ring on the delivery microcatheter was loosened. With slight forward gentle traction with the right hand on the delivery micro guidewire, with the left hand the delivery microcatheter was unsheathed unsheathing the distal wire, and also  the distal few mm of the device. The cigar configuration changed through expansion of the device distally. The combination was then retrieved to the distal landing zone which was at the left internal carotid artery terminus. Thereafter, the micro guidewire was then advanced unsheathing the device distally. More of the device was then deployed in the manner described advancing the micro guidewire, and maintaining the microcatheter centralized in the native lumen. Once the entire device have been deployed, the microcatheter was gently advanced through the device and then retrieved. Control arteriogram performed through the Catalyst guide catheter demonstrated excellent apposition and deployment of the device with stasis being seen in the aneurysm itself. Under constant fluoroscopic guidance, the delivery micro guidewire and the catheter were retrieved and removed. A control arteriogram performed through the 5 [place] Catalyst guide catheter at 15 and 30 minutes post deployment continued to demonstrate excellent flow through the device with partial stasis in the aneurysm itself. Patency of the left posterior communicating artery was maintained. A final control arteriogram performed through the RIST sheath demonstrated excellent flow through the internal carotid artery and the through the device. No evidence of intraluminal filling defects or of occlusion was seen. The left internal carotid artery extra cranially remained widely patent also. The 7 [place] long sheath was then retrieved and removed. The 6/7 radial sheath was then removed with the successful application of a wrist band for hemostasis in the right radial artery puncture site.  Distal right radial pulse was verified to be present. Prior to removal, the patient was also given a total of 4.5 mg of Integrilin intra-arterially to ensure no platelet aggregation in the device itself. The patient's general anesthesia was then reversed and the patient was then extubated without difficulty. Upon recovery, the patient complained of a sore throat but, otherwise, remained oriented with normal speech without any motor abnormalities. Tammy Rodgers was then transferred to the PACU and then to the neuro ICU to continue on low-dose IV heparin, close monitoring of her blood pressure, and close neurological checks. Overnight the patient exhibited no complications. The following morning, the IV heparin was stopped and the patient was switched to aspirin 325 mg a day, and Plavix 75 mg a day. The right wrist puncture site remained soft with the right radial artery pulse being present. Tammy Rodgers was then mobilized independently and able to tolerate overall intake. Tammy Rodgers was then discharged home under the care of family with special instructions to maintain adequate hydration, to take her medications without delay. Tammy Rodgers was advised to refrain from driving for 2 weeks, avoid stooping, bending or lifting weights above 10 pounds. Tammy Rodgers was advised also to maintain adequate hydration by drinking water. Should Tammy Rodgers develop any symptoms of gait instability, motor symptoms, speech difficulties or vision symptoms Tammy Rodgers was advised to call 911. Tammy Rodgers expressed understanding and agreement with the above management plan. 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 PLAN: Follow-up in the clinic 2 weeks post discharge. Electronically Signed   By: Luanne Bras M.D.   On: 07/07/2019 15:47   CT Renal Stone Study  Result Date: 07/23/2019 CLINICAL DATA:  59 year old female acute LEFT flank and  abdominal pain with hematuria. EXAM: CT ABDOMEN AND PELVIS WITHOUT CONTRAST TECHNIQUE: Multidetector CT imaging of the abdomen and pelvis was performed following the standard protocol without IV contrast. COMPARISON:  None. FINDINGS: Please note that parenchymal abnormalities may be missed without  intravenous contrast. Lower chest: No acute abnormality. Hepatobiliary: Liver is unremarkable. The patient is status post cholecystectomy. No biliary dilatation. Pancreas: Unremarkable Spleen: Unremarkable Adrenals/Urinary Tract: Punctate nonobstructing bilateral renal calculi are identified, 2 on the RIGHT and 3 on the LEFT. There is no evidence of hydronephrosis or obstructing urinary calculi. The adrenal glands and bladder are unremarkable. Stomach/Bowel: Stomach is within normal limits. Appendix appears normal. No evidence of bowel wall thickening, distention, or inflammatory changes. Vascular/Lymphatic: No significant vascular findings are present. No enlarged abdominal or pelvic lymph nodes. Reproductive: Status post hysterectomy. No adnexal masses. Other: No ascites, focal collection or pneumoperitoneum. Musculoskeletal: No acute or suspicious bony abnormality. Moderate degenerative disc disease at L5-S1 noted. IMPRESSION: 1. No evidence of acute abnormality. 2. Punctate nonobstructing bilateral renal calculi. Electronically Signed   By: Margarette Canada M.D.   On: 07/23/2019 11:58   IR ANGIO INTRA EXTRACRAN SEL COM CAROTID INNOMINATE UNI R MOD SED  Result Date: 07/08/2019 CLINICAL DATA:  Patient with history of ischemic stroke. 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 EXAM: TRANSCATHETER THERAPY EMBOLIZATION COMPARISON:  CT angiogram of the head and neck of May 07, 2019. MEDICATIONS: Heparin 4,000 units IV. Ancef 2 g IV antibiotic was administered within 1 hour of the procedure. ANESTHESIA/SEDATION: General  anesthesia CONTRAST:  Isovue 300 approximately 75 mL FLUOROSCOPY TIME:  Fluoroscopy Time: 47 minutes 30 seconds (2986 mGy). COMPLICATIONS: None immediate. TECHNIQUE: Informed written consent was obtained from the patient after a thorough discussion of the procedural risks, benefits and alternatives. All questions were addressed. Maximal Sterile Barrier Technique was utilized including caps, mask, sterile gowns, sterile gloves, sterile drape, hand hygiene and skin antiseptic. A timeout was performed prior to the initiation of the procedure. The right forearm to the wrist was prepped and draped in the usual sterile manner. The right radial artery was then identified and its morphology documented with ultrasound examination. A dorsal palmar anastomosis was verified to be present. Using ultrasound guidance and a micropuncture set access into the right radial artery was obtained without difficulty. Over a 0.018 inch micro guidewire, a 6/7 French radial sheath was then inserted. The obturator and the micro guidewire were removed. Good aspiration was obtained from the side port of the sheath. A cocktail of 2000 units of heparin, 2.5 mg of verapamil and 200 mcg of nitroglycerin was then infused in diluted form without event. A right radial arteriogram was obtained. Over a 0.035 inch Roadrunner guidewire, a 5 [place] Simmons 2 diagnostic catheter was advanced to the aortic arch region, and cannulations were performed of the left vertebral artery, the right common artery and the left common carotid artery. Arteriograms were obtained in these vessels. Also obtained was a 3D rotational arteriogram intracranially from the left common carotid artery. 3D reconstruction was then performed of the left anterior circulation. FINDINGS: The left vertebral artery origin is widely patent. The vessel is seen to opacify to the cranial skull base. Wide patency is seen of the left posterior-inferior cerebellar artery and the left  vertebrobasilar junction. The opacified portion of the basilar artery, the posterior cerebral arteries, the superior cerebellar arteries and the anterior-inferior cerebellar arteries is noted into the capillary and venous phases. Unopacified blood is seen in the basilar artery from the contralateral vertebral artery. The innominate arteriogram demonstrates the origin of the right subclavian artery and the right common carotid artery to be widely patent. The origin of the  right vertebral artery is widely patent. The vessel is seen to opacify to the cranial skull base. The right common carotid arteriogram demonstrates the right external carotid artery and its major branches to be widely patent. The right internal carotid artery at the bulb to the cranial skull base is widely patent. The petrous, cavernous and supraclinoid segments are widely patent. A right posterior communicating artery is seen opacifying the right posterior cerebral artery distribution. The right middle cerebral artery and the right anterior cerebral artery opacify into the capillary and venous phases. The left common carotid arteriogram demonstrates the left external carotid artery and its major branches to be widely patent. The left internal carotid artery at the bulb to the cranial skull base also demonstrates wide patency. The petrous, the cavernous and the supraclinoid segments are widely patent. A left posterior communicating artery is seen opacifying the left posterior cerebral artery territory. 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 The 3D reconstruction images demonstrate the aneurysm to arise from the upper most proximal aspect of the left posterior communicating artery, just at its origin from the internal carotid artery. The left middle cerebral artery and the left anterior cerebral artery opacify into the  capillary and venous phases. ENDOVASCULAR EMBOLIZATION OF WIDE NECK LEFT INTERNAL CAROTID ARTERY, POSTERIOR COMMUNICATING ARTERY REGION ANEURYSM. The diagnostic 5 [place] Simmons 2 catheter in the left common carotid artery was then exchanged over a 0.035 inch 300 cm Rosen exchange guidewire for a 95 cm 7 French RIST sheath. Guidewire was removed. Good aspiration obtained from the 7 French RIST sheath. A gentle control arteriogram performed through this demonstrated no evidence of spasms, dissections or of intraluminal filling defects. Over a 0.035 inch Roadrunner guidewire, a 5 French 115 cm Catalyst guide catheter was then advanced to the horizontal petrous segment of the left internal carotid artery, with the advancement of the 7 French sheath to the cervical petrous junction. The guidewire was removed. Good aspiration obtained from the hub of the 5 [place] Catalyst guide catheter in the left internal carotid artery. Control arteriogram demonstrated no evidence of intraluminal filling defects or of occlusions. Following measurements of the left internal carotid artery at the terminus, and the distal cavernous segment of the left internal carotid artery of the landing zones, it was decided to use a 3.5 mm x 12 mm pipeline flex flow diverter device for embolization treatment. Over a 0.014 inch standard Synchro micro guidewire with a J-tip configuration, a 150 cm 027 Phenom microcatheter was advanced to the distal end of the Catalyst guide catheter. With the micro guidewire leading with a J-tip configuration, the combination was navigated without difficulty with a torque device to the M2 M3 region of the inferior division of the left middle cerebral artery. The guidewire was removed. Good aspiration obtained from the Phenom microcatheter. Gentle control arteriogram performed through this demonstrated safe position of tip of the microcatheter which was then connected to continuous heparinized saline infusion. The  housing of the 3.5 mm x 12 mm device was then purged with heparinized saline infusion. Under intermittent fluoroscopy, this was then advanced to the distal end of the microcatheter. The O ring on the delivery microcatheter was loosened. With slight forward gentle traction with the right hand on the delivery micro guidewire, with the left hand the delivery microcatheter was unsheathed unsheathing the distal wire, and also the distal few mm  of the device. The cigar configuration changed through expansion of the device distally. The combination was then retrieved to the distal landing zone which was at the left internal carotid artery terminus. Thereafter, the micro guidewire was then advanced unsheathing the device distally. More of the device was then deployed in the manner described advancing the micro guidewire, and maintaining the microcatheter centralized in the native lumen. Once the entire device have been deployed, the microcatheter was gently advanced through the device and then retrieved. Control arteriogram performed through the Catalyst guide catheter demonstrated excellent apposition and deployment of the device with stasis being seen in the aneurysm itself. Under constant fluoroscopic guidance, the delivery micro guidewire and the catheter were retrieved and removed. A control arteriogram performed through the 5 [place] Catalyst guide catheter at 15 and 30 minutes post deployment continued to demonstrate excellent flow through the device with partial stasis in the aneurysm itself. Patency of the left posterior communicating artery was maintained. A final control arteriogram performed through the RIST sheath demonstrated excellent flow through the internal carotid artery and the through the device. No evidence of intraluminal filling defects or of occlusion was seen. The left internal carotid artery extra cranially remained widely patent also. The 7 French long sheath was then retrieved and removed. The  6/7 radial sheath was then removed with the successful application of a wrist band for hemostasis in the right radial artery puncture site. Distal right radial pulse was verified to be present. Prior to removal, the patient was also given a total of 4.5 mg of Integrilin intra-arterially to ensure no platelet aggregation in the device itself. The patient's general anesthesia was then reversed and the patient was then extubated without difficulty. Upon recovery, the patient complained of a sore throat but, otherwise, remained oriented with normal speech without any motor abnormalities. Tammy Rodgers was then transferred to the PACU and then to the neuro ICU to continue on low-dose IV heparin, close monitoring of her blood pressure, and close neurological checks. Overnight the patient exhibited no complications. The following morning, the IV heparin was stopped and the patient was switched to aspirin 325 mg a day, and Plavix 75 mg a day. The right wrist puncture site remained soft with the right radial artery pulse being present. Tammy Rodgers was then mobilized independently and able to tolerate overall intake. Tammy Rodgers was then discharged home under the care of family with special instructions to maintain adequate hydration, to take her medications without delay. Tammy Rodgers was advised to refrain from driving for 2 weeks, avoid stooping, bending or lifting weights above 10 pounds. Tammy Rodgers was advised also to maintain adequate hydration by drinking water. Should Tammy Rodgers develop any symptoms of gait instability, motor symptoms, speech difficulties or vision symptoms Tammy Rodgers was advised to call 911. Tammy Rodgers expressed understanding and agreement with the above management plan. 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 PLAN: Follow-up in the clinic 2 weeks post discharge. Electronically Signed   By: Luanne Bras M.D.    On: 07/07/2019 15:47   IR ANGIO INTRA EXTRACRAN SEL INTERNAL CAROTID UNI L MOD SED  Result Date: 07/08/2019 CLINICAL DATA:  Patient with history of ischemic stroke. 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 EXAM: TRANSCATHETER THERAPY EMBOLIZATION COMPARISON:  CT angiogram of the head and neck of May 07, 2019.  MEDICATIONS: Heparin 4,000 units IV. Ancef 2 g IV antibiotic was administered within 1 hour of the procedure. ANESTHESIA/SEDATION: General anesthesia CONTRAST:  Isovue 300 approximately 75 mL FLUOROSCOPY TIME:  Fluoroscopy Time: 47 minutes 30 seconds (2986 mGy). COMPLICATIONS: None immediate. TECHNIQUE: Informed written consent was obtained from the patient after a thorough discussion of the procedural risks, benefits and alternatives. All questions were addressed. Maximal Sterile Barrier Technique was utilized including caps, mask, sterile gowns, sterile gloves, sterile drape, hand hygiene and skin antiseptic. A timeout was performed prior to the initiation of the procedure. The right forearm to the wrist was prepped and draped in the usual sterile manner. The right radial artery was then identified and its morphology documented with ultrasound examination. A dorsal palmar anastomosis was verified to be present. Using ultrasound guidance and a micropuncture set access into the right radial artery was obtained without difficulty. Over a 0.018 inch micro guidewire, a 6/7 French radial sheath was then inserted. The obturator and the micro guidewire were removed. Good aspiration was obtained from the side port of the sheath. A cocktail of 2000 units of heparin, 2.5 mg of verapamil and 200 mcg of nitroglycerin was then infused in diluted form without event. A right radial arteriogram was obtained. Over a 0.035 inch Roadrunner guidewire, a 5 [place] Simmons 2 diagnostic catheter was advanced to the aortic  arch region, and cannulations were performed of the left vertebral artery, the right common artery and the left common carotid artery. Arteriograms were obtained in these vessels. Also obtained was a 3D rotational arteriogram intracranially from the left common carotid artery. 3D reconstruction was then performed of the left anterior circulation. FINDINGS: The left vertebral artery origin is widely patent. The vessel is seen to opacify to the cranial skull base. Wide patency is seen of the left posterior-inferior cerebellar artery and the left vertebrobasilar junction. The opacified portion of the basilar artery, the posterior cerebral arteries, the superior cerebellar arteries and the anterior-inferior cerebellar arteries is noted into the capillary and venous phases. Unopacified blood is seen in the basilar artery from the contralateral vertebral artery. The innominate arteriogram demonstrates the origin of the right subclavian artery and the right common carotid artery to be widely patent. The origin of the right vertebral artery is widely patent. The vessel is seen to opacify to the cranial skull base. The right common carotid arteriogram demonstrates the right external carotid artery and its major branches to be widely patent. The right internal carotid artery at the bulb to the cranial skull base is widely patent. The petrous, cavernous and supraclinoid segments are widely patent. A right posterior communicating artery is seen opacifying the right posterior cerebral artery distribution. The right middle cerebral artery and the right anterior cerebral artery opacify into the capillary and venous phases. The left common carotid arteriogram demonstrates the left external carotid artery and its major branches to be widely patent. The left internal carotid artery at the bulb to the cranial skull base also demonstrates wide patency. The petrous, the cavernous and the supraclinoid segments are widely patent. A left  posterior communicating artery is seen opacifying the left posterior cerebral artery territory. 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 The 3D reconstruction images demonstrate the aneurysm to arise from the upper most proximal aspect of the left posterior communicating artery, just at its origin from the internal carotid artery. The  left middle cerebral artery and the left anterior cerebral artery opacify into the capillary and venous phases. ENDOVASCULAR EMBOLIZATION OF WIDE NECK LEFT INTERNAL CAROTID ARTERY, POSTERIOR COMMUNICATING ARTERY REGION ANEURYSM. The diagnostic 5 French Simmons 2 catheter in the left common carotid artery was then exchanged over a 0.035 inch 300 cm Rosen exchange guidewire for a 95 cm 7 French RIST sheath. Guidewire was removed. Good aspiration obtained from the 7 French RIST sheath. A gentle control arteriogram performed through this demonstrated no evidence of spasms, dissections or of intraluminal filling defects. Over a 0.035 inch Roadrunner guidewire, a 5 French 115 cm Catalyst guide catheter was then advanced to the horizontal petrous segment of the left internal carotid artery, with the advancement of the 7 French sheath to the cervical petrous junction. The guidewire was removed. Good aspiration obtained from the hub of the 5 [place] Catalyst guide catheter in the left internal carotid artery. Control arteriogram demonstrated no evidence of intraluminal filling defects or of occlusions. Following measurements of the left internal carotid artery at the terminus, and the distal cavernous segment of the left internal carotid artery of the landing zones, it was decided to use a 3.5 mm x 12 mm pipeline flex flow diverter device for embolization treatment. Over a 0.014 inch standard Synchro micro guidewire with a J-tip configuration, a 150 cm 027 Phenom  microcatheter was advanced to the distal end of the Catalyst guide catheter. With the micro guidewire leading with a J-tip configuration, the combination was navigated without difficulty with a torque device to the M2 M3 region of the inferior division of the left middle cerebral artery. The guidewire was removed. Good aspiration obtained from the Phenom microcatheter. Gentle control arteriogram performed through this demonstrated safe position of tip of the microcatheter which was then connected to continuous heparinized saline infusion. The housing of the 3.5 mm x 12 mm device was then purged with heparinized saline infusion. Under intermittent fluoroscopy, this was then advanced to the distal end of the microcatheter. The O ring on the delivery microcatheter was loosened. With slight forward gentle traction with the right hand on the delivery micro guidewire, with the left hand the delivery microcatheter was unsheathed unsheathing the distal wire, and also the distal few mm of the device. The cigar configuration changed through expansion of the device distally. The combination was then retrieved to the distal landing zone which was at the left internal carotid artery terminus. Thereafter, the micro guidewire was then advanced unsheathing the device distally. More of the device was then deployed in the manner described advancing the micro guidewire, and maintaining the microcatheter centralized in the native lumen. Once the entire device have been deployed, the microcatheter was gently advanced through the device and then retrieved. Control arteriogram performed through the Catalyst guide catheter demonstrated excellent apposition and deployment of the device with stasis being seen in the aneurysm itself. Under constant fluoroscopic guidance, the delivery micro guidewire and the catheter were retrieved and removed. A control arteriogram performed through the 5 [place] Catalyst guide catheter at 15 and 30 minutes  post deployment continued to demonstrate excellent flow through the device with partial stasis in the aneurysm itself. Patency of the left posterior communicating artery was maintained. A final control arteriogram performed through the RIST sheath demonstrated excellent flow through the internal carotid artery and the through the device. No evidence of intraluminal filling defects or of occlusion was seen. The left internal carotid artery extra cranially remained widely patent also. The 7 [place]  long sheath was then retrieved and removed. The 6/7 radial sheath was then removed with the successful application of a wrist band for hemostasis in the right radial artery puncture site. Distal right radial pulse was verified to be present. Prior to removal, the patient was also given a total of 4.5 mg of Integrilin intra-arterially to ensure no platelet aggregation in the device itself. The patient's general anesthesia was then reversed and the patient was then extubated without difficulty. Upon recovery, the patient complained of a sore throat but, otherwise, remained oriented with normal speech without any motor abnormalities. Tammy Rodgers was then transferred to the PACU and then to the neuro ICU to continue on low-dose IV heparin, close monitoring of her blood pressure, and close neurological checks. Overnight the patient exhibited no complications. The following morning, the IV heparin was stopped and the patient was switched to aspirin 325 mg a day, and Plavix 75 mg a day. The right wrist puncture site remained soft with the right radial artery pulse being present. Tammy Rodgers was then mobilized independently and able to tolerate overall intake. Tammy Rodgers was then discharged home under the care of family with special instructions to maintain adequate hydration, to take her medications without delay. Tammy Rodgers was advised to refrain from driving for 2 weeks, avoid stooping, bending or lifting weights above 10 pounds. Tammy Rodgers was advised also to  maintain adequate hydration by drinking water. Should Tammy Rodgers develop any symptoms of gait instability, motor symptoms, speech difficulties or vision symptoms Tammy Rodgers was advised to call 911. Tammy Rodgers expressed understanding and agreement with the above management plan. 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 PLAN: Follow-up in the clinic 2 weeks post discharge. Electronically Signed   By: Luanne Bras M.D.   On: 07/07/2019 15:47   IR ANGIO VERTEBRAL SEL VERTEBRAL UNI L MOD SED  Result Date: 07/08/2019 CLINICAL DATA:  Patient with history of ischemic stroke. 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 EXAM: TRANSCATHETER THERAPY EMBOLIZATION COMPARISON:  CT angiogram of the head and neck of May 07, 2019. MEDICATIONS: Heparin 4,000 units IV. Ancef 2 g IV antibiotic was administered within 1 hour of the procedure. ANESTHESIA/SEDATION: General anesthesia CONTRAST:  Isovue 300 approximately 75 mL FLUOROSCOPY TIME:  Fluoroscopy Time: 47 minutes 30 seconds (2986 mGy). COMPLICATIONS: None immediate. TECHNIQUE: Informed written consent was obtained from the patient after a thorough discussion of the procedural risks, benefits and alternatives. All questions were addressed. Maximal Sterile Barrier Technique was utilized including caps, mask, sterile gowns, sterile gloves, sterile drape, hand hygiene and skin antiseptic. A timeout was performed prior to the initiation of the procedure. The right forearm to the wrist was prepped and draped in the usual sterile manner. The right radial artery was then identified and its morphology documented with ultrasound examination. A dorsal palmar anastomosis was verified to be present. Using ultrasound guidance and a micropuncture set  access into the right radial artery was obtained without difficulty. Over a 0.018 inch micro guidewire, a 6/7 French radial sheath was then inserted. The obturator and the micro guidewire were removed. Good aspiration was obtained from the side port of the sheath. A cocktail of 2000 units of heparin, 2.5 mg of verapamil and 200 mcg of nitroglycerin was then infused in diluted form without event. A right radial arteriogram was obtained.  Over a 0.035 inch Roadrunner guidewire, a 5 [place] Simmons 2 diagnostic catheter was advanced to the aortic arch region, and cannulations were performed of the left vertebral artery, the right common artery and the left common carotid artery. Arteriograms were obtained in these vessels. Also obtained was a 3D rotational arteriogram intracranially from the left common carotid artery. 3D reconstruction was then performed of the left anterior circulation. FINDINGS: The left vertebral artery origin is widely patent. The vessel is seen to opacify to the cranial skull base. Wide patency is seen of the left posterior-inferior cerebellar artery and the left vertebrobasilar junction. The opacified portion of the basilar artery, the posterior cerebral arteries, the superior cerebellar arteries and the anterior-inferior cerebellar arteries is noted into the capillary and venous phases. Unopacified blood is seen in the basilar artery from the contralateral vertebral artery. The innominate arteriogram demonstrates the origin of the right subclavian artery and the right common carotid artery to be widely patent. The origin of the right vertebral artery is widely patent. The vessel is seen to opacify to the cranial skull base. The right common carotid arteriogram demonstrates the right external carotid artery and its major branches to be widely patent. The right internal carotid artery at the bulb to the cranial skull base is widely patent. The petrous, cavernous and supraclinoid segments are widely  patent. A right posterior communicating artery is seen opacifying the right posterior cerebral artery distribution. The right middle cerebral artery and the right anterior cerebral artery opacify into the capillary and venous phases. The left common carotid arteriogram demonstrates the left external carotid artery and its major branches to be widely patent. The left internal carotid artery at the bulb to the cranial skull base also demonstrates wide patency. The petrous, the cavernous and the supraclinoid segments are widely patent. A left posterior communicating artery is seen opacifying the left posterior cerebral artery territory. 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 The 3D reconstruction images demonstrate the aneurysm to arise from the upper most proximal aspect of the left posterior communicating artery, just at its origin from the internal carotid artery. The left middle cerebral artery and the left anterior cerebral artery opacify into the capillary and venous phases. ENDOVASCULAR EMBOLIZATION OF WIDE NECK LEFT INTERNAL CAROTID ARTERY, POSTERIOR COMMUNICATING ARTERY REGION ANEURYSM. The diagnostic 5 French Simmons 2 catheter in the left common carotid artery was then exchanged over a 0.035 inch 300 cm Rosen exchange guidewire for a 95 cm 7 French RIST sheath. Guidewire was removed. Good aspiration obtained from the 7 French RIST sheath. A gentle control arteriogram performed through this demonstrated no evidence of spasms, dissections or of intraluminal filling defects. Over a 0.035 inch Roadrunner guidewire, a 5 French 115 cm Catalyst guide catheter was then advanced to the horizontal petrous segment of the left internal carotid artery, with the advancement of the 7 French sheath to the cervical petrous junction. The guidewire was removed. Good aspiration obtained from the  hub of the 5 [place] Catalyst guide catheter in the left internal carotid artery. Control arteriogram demonstrated no evidence of intraluminal filling defects or of occlusions. Following measurements of the left internal carotid artery at the terminus, and the distal cavernous segment of the left internal carotid artery of the landing zones, it was decided to use a 3.5 mm x 12 mm pipeline flex flow diverter device for embolization treatment.  Over a 0.014 inch standard Synchro micro guidewire with a J-tip configuration, a 150 cm 027 Phenom microcatheter was advanced to the distal end of the Catalyst guide catheter. With the micro guidewire leading with a J-tip configuration, the combination was navigated without difficulty with a torque device to the M2 M3 region of the inferior division of the left middle cerebral artery. The guidewire was removed. Good aspiration obtained from the Phenom microcatheter. Gentle control arteriogram performed through this demonstrated safe position of tip of the microcatheter which was then connected to continuous heparinized saline infusion. The housing of the 3.5 mm x 12 mm device was then purged with heparinized saline infusion. Under intermittent fluoroscopy, this was then advanced to the distal end of the microcatheter. The O ring on the delivery microcatheter was loosened. With slight forward gentle traction with the right hand on the delivery micro guidewire, with the left hand the delivery microcatheter was unsheathed unsheathing the distal wire, and also the distal few mm of the device. The cigar configuration changed through expansion of the device distally. The combination was then retrieved to the distal landing zone which was at the left internal carotid artery terminus. Thereafter, the micro guidewire was then advanced unsheathing the device distally. More of the device was then deployed in the manner described advancing the micro guidewire, and maintaining the  microcatheter centralized in the native lumen. Once the entire device have been deployed, the microcatheter was gently advanced through the device and then retrieved. Control arteriogram performed through the Catalyst guide catheter demonstrated excellent apposition and deployment of the device with stasis being seen in the aneurysm itself. Under constant fluoroscopic guidance, the delivery micro guidewire and the catheter were retrieved and removed. A control arteriogram performed through the 5 [place] Catalyst guide catheter at 15 and 30 minutes post deployment continued to demonstrate excellent flow through the device with partial stasis in the aneurysm itself. Patency of the left posterior communicating artery was maintained. A final control arteriogram performed through the RIST sheath demonstrated excellent flow through the internal carotid artery and the through the device. No evidence of intraluminal filling defects or of occlusion was seen. The left internal carotid artery extra cranially remained widely patent also. The 7 French long sheath was then retrieved and removed. The 6/7 radial sheath was then removed with the successful application of a wrist band for hemostasis in the right radial artery puncture site. Distal right radial pulse was verified to be present. Prior to removal, the patient was also given a total of 4.5 mg of Integrilin intra-arterially to ensure no platelet aggregation in the device itself. The patient's general anesthesia was then reversed and the patient was then extubated without difficulty. Upon recovery, the patient complained of a sore throat but, otherwise, remained oriented with normal speech without any motor abnormalities. Tammy Rodgers was then transferred to the PACU and then to the neuro ICU to continue on low-dose IV heparin, close monitoring of her blood pressure, and close neurological checks. Overnight the patient exhibited no complications. The following morning, the IV  heparin was stopped and the patient was switched to aspirin 325 mg a day, and Plavix 75 mg a day. The right wrist puncture site remained soft with the right radial artery pulse being present. Tammy Rodgers was then mobilized independently and able to tolerate overall intake. Tammy Rodgers was then discharged home under the care of family with special instructions to maintain adequate hydration, to take her medications without delay. Tammy Rodgers was advised to refrain from  driving for 2 weeks, avoid stooping, bending or lifting weights above 10 pounds. Tammy Rodgers was advised also to maintain adequate hydration by drinking water. Should Tammy Rodgers develop any symptoms of gait instability, motor symptoms, speech difficulties or vision symptoms Tammy Rodgers was advised to call 911. Tammy Rodgers expressed understanding and agreement with the above management plan. 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 PLAN: Follow-up in the clinic 2 weeks post discharge. Electronically Signed   By: Luanne Bras M.D.   On: 07/07/2019 15:47    Labs:  CBC: Recent Labs    07/06/19 0722 07/07/19 0500 07/18/19 2037 07/23/19 1127  WBC 4.0 9.5 4.4 2.7*  HGB 12.0 9.9* 11.6* 11.9*  HCT 35.5* 30.2* 36.4 36.5  PLT 276 234 310 275    COAGS: Recent Labs    05/07/19 1344 07/06/19 0722  INR 0.9 1.0  APTT 26  --     BMP: Recent Labs    07/06/19 0722 07/07/19 0500 07/18/19 2037 07/23/19 1127  NA 142 142 141 141  K 3.7 4.0 3.5 3.4*  CL 106 109 104 103  CO2 22 26 24 27   GLUCOSE 140* 103* 96 113*  BUN 18 18 21* 17  CALCIUM 10.4* 9.2 10.2 10.2  CREATININE 0.80 0.67 0.90 0.72  GFRNONAA >60 >60 >60 >60  GFRAA >60 >60 >60 >60    LIVER FUNCTION TESTS: Recent Labs    05/07/19 1344  BILITOT 0.5  AST 31  ALT 29  ALKPHOS 76  PROT 8.4*  ALBUMIN 5.1*    TUMOR MARKERS: No results for input(s): AFPTM, CEA, CA199,  CHROMGRNA in the last 8760 hours.  Assessment Patient is status post endovascular embolization of left P-comm artery region aneurysm without event.  Patient was advised to keep her appointment with her neurologist. Should her bruising worsen, the patient has been asked to report in which case, her Plavix dose may have to be decreased by half.  Additionally patient was advised to be have her primary care physician check her hemoglobin in about 7 days time.  Questions were answered to her satisfaction.  Patient will undergo f a follow-up MRI/MRA of the brain in approximately 4 months from the time of the initial treatment.   Electronically Signed: Luanne Bras K PA-C 07/27/2019, 1:52 PM   I spent approximately 30 minutes in caring for this patient

## 2019-07-28 ENCOUNTER — Ambulatory Visit (HOSPITAL_COMMUNITY)

## 2019-07-29 ENCOUNTER — Encounter: Payer: Self-pay | Admitting: Neurology

## 2019-07-31 NOTE — ED Provider Notes (Signed)
[facility] EMERGENCY DEPARTMENT Provider Note   CSN: [id number] Arrival date & time: 07/23/19  1038     History Chief Complaint  Patient presents with  . Hematuria    Tammy Rodgers is a 59 y.o. female.  HPI   59 year old female with hematuria.  This is been ongoing issue.  Recently seen in the emergency room.  Placed on antibiotics improvement.  Blood in urine occasional clots.  No pain.  Not anticoagulated.  Past Medical History:  Diagnosis Date  . Anemia   . Anxiety   . 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 GERD (gastroesophageal reflux disease)   . High cholesterol   . Hypertension    states BP is up and down; has been on med. x 2 mos.  . 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 Pre-diabetes   . Seizures ([place])   . Sleep apnea    uses CPAP nightly    Patient Active Problem List   Diagnosis Date Noted  . Gait abnormality 05/10/2019  . Partial symptomatic epilepsy with complex partial seizures, not intractable, without status epilepticus ([place]) 05/10/2019  . Brain aneurysm 05/09/2019  . Bilateral carpal tunnel syndrome 04/28/2018  . Paresthesia 01/28/2017  . Encounter for therapeutic drug monitoring 04/17/2016  . Falls 12/10/2015  . Acute confusional state 12/10/2015  . Weakness 12/10/2015  . Seizure disorder, complex partial ([place]) 01/15/2014  . Hypersomnia, persistent 03/06/2013  . Obstructive sleep apnea 11/14/2012  . Bloody discharge from left nipple 09/27/2012    Past Surgical History:  Procedure Laterality Date  . ABDOMINAL HYSTERECTOMY  12/13/2002   partial  . BREAST LUMPECTOMY WITH NEEDLE LOCALIZATION Left 11/24/2012   Procedure: LEFT BREAST LUMPECTOMY WITH NEEDLE LOCALIZATION;  Surgeon: Harl Bowie, MD;  Location: [facility];  Service: General;  Laterality: Left;  . CHOLECYSTECTOMY    . COLONOSCOPY    . IR 3D INDEPENDENT WKST  07/11/2019  . IR ANGIO INTRA EXTRACRAN SEL COM CAROTID INNOMINATE  UNI R MOD SED  07/06/2019  . IR ANGIO INTRA EXTRACRAN SEL INTERNAL CAROTID UNI L MOD SED  07/06/2019  . IR ANGIO VERTEBRAL SEL VERTEBRAL UNI L MOD SED  07/06/2019  . IR ANGIOGRAM FOLLOW UP STUDY  07/06/2019  . IR TRANSCATH/EMBOLIZ  07/06/2019  . IR US GUIDE VASC ACCESS RIGHT  07/06/2019  . PARATHYROIDECTOMY     partial  . RADIOLOGY WITH ANESTHESIA N/A 07/06/2019   Procedure: IR WITH ANESTHESIA EMBLOLIZATION;  Surgeon: Luanne Bras, MD;  Location: [facility];  Service: Radiology;  Laterality: N/A;     OB History   No obstetric history on file.     Family History  Problem Relation Age of Onset  . Stroke Mother   . Heart attack Mother   . Diabetes Mother   . COPD Father   . Heart disease Other     Social History   Tobacco Use  . Smoking status: Never Smoker  . Smokeless tobacco: Never Used  Vaping Use  . Vaping Use: Never used  Substance Use Topics  . Alcohol use: No  . Drug use: No    Home Medications Prior to Admission medications   Medication Sig Start Date End Date Taking? Authorizing Provider  amLODipine (NORVASC) 10 MG tablet Take 10 mg by mouth daily.     [provider]  aspirin EC 81 MG tablet Take 81 mg by mouth daily.    [provider]  cephALEXin [facility])  500 MG capsule Take 1 capsule (500 mg total) by mouth 3 (three) times daily. 07/18/19   Horton, Barbette Hair, MD  cloBAZam (ONFI) 10 MG tablet Take 1 tablet (10 mg total) by mouth at bedtime. 05/09/19   Suzzanne Cloud, NP  clopidogrel (PLAVIX) 75 MG tablet Take 75 mg by mouth daily.    [provider]  diphenhydrAMINE (BENADRYL) 25 MG tablet Take 50 mg by mouth once. Take 2 tablets one hour before procedure    [provider]  ezetimibe-simvastatin (VYTORIN) 10-20 MG per tablet Take 1 tablet by mouth at bedtime.    [provider]  LAMICTAL 200 MG tablet TAKE 1 TABLET IN THE MORNING AND 1.5 TABLETS IN THE EVENING Patient taking differently: Take 200-300 mg by mouth See  admin instructions. TAKE 1 TABLET IN THE MORNING AND 1.5 TABLETS IN THE EVENING 06/07/19   Marcial Pacas, MD  losartan-hydrochlorothiazide (HYZAAR) 50-12.5 MG tablet Take 1 tablet by mouth daily.     [provider]  metFORMIN (GLUCOPHAGE) 500 MG tablet Take 250 mg by mouth daily.     [provider]  Multiple Vitamins-Minerals (MULTIVITAMIN WITH MINERALS) tablet Take 1 tablet by mouth daily.    [provider]  omeprazole (PRILOSEC) 20 MG capsule Take 20 mg by mouth daily.    [provider]  phenazopyridine (PYRIDIUM) 200 MG tablet Take 1 tablet (200 mg total) by mouth 3 (three) times daily as needed for pain. 07/23/19   Virgel Manifold, MD  predniSONE (DELTASONE) 50 MG tablet Take 50 mg by mouth See admin instructions. Take 1 tablet by mouth 13 hours before procedure, 7 hours before procedure, and 1 hour before procedure for 3 doses    [provider]    Allergies    Iodinated diagnostic agents and Ritalin [methylphenidate hcl]  Review of Systems   Review of Systems All systems reviewed and negative, other than as noted in HPI.  Physical Exam Updated Vital Signs BP 124/66 (BP Location: Right Arm)   Pulse 65   Temp 98.1 F (36.7 C) (Oral)   Resp 16   Ht 5\' 3"  (1.6 m)   Wt 76.2 kg   SpO2 99%   BMI 29.76 kg/m   Physical Exam Vitals and nursing note reviewed.  Constitutional:      General: She is not in acute distress.    Appearance: She is well-developed.  HENT:     Head: Normocephalic and atraumatic.  Eyes:     General:        Right eye: No discharge.        Left eye: No discharge.     Conjunctiva/sclera: Conjunctivae normal.  Cardiovascular:     Rate and Rhythm: Normal rate and regular rhythm.     Heart sounds: Normal heart sounds. No murmur heard.  No friction rub. No gallop.   Pulmonary:     Effort: Pulmonary effort is normal. No respiratory distress.     Breath sounds: Normal breath sounds.  Abdominal:     General: There is  no distension.     Palpations: Abdomen is soft.     Tenderness: There is no abdominal tenderness.  Musculoskeletal:        General: No tenderness.     Cervical back: Neck supple.  Skin:    General: Skin is warm and dry.  Neurological:     Mental Status: She is alert.  Psychiatric:        Behavior: Behavior normal.  Thought Content: Thought content normal.     ED Results / Procedures / Treatments   Labs (all labs ordered are listed, but only abnormal results are displayed) Labs Reviewed  URINALYSIS, ROUTINE W REFLEX MICROSCOPIC - Abnormal; Notable for the following components:      Result Value   Color, Urine BROWN (*)    APPearance TURBID (*)    Glucose, UA   (*)    Value: TEST NOT REPORTED DUE TO COLOR INTERFERENCE OF URINE PIGMENT   Hgb urine dipstick   (*)    Value: TEST NOT REPORTED DUE TO COLOR INTERFERENCE OF URINE PIGMENT   Bilirubin Urine   (*)    Value: TEST NOT REPORTED DUE TO COLOR INTERFERENCE OF URINE PIGMENT   Ketones, ur   (*)    Value: TEST NOT REPORTED DUE TO COLOR INTERFERENCE OF URINE PIGMENT   Protein, ur   (*)    Value: TEST NOT REPORTED DUE TO COLOR INTERFERENCE OF URINE PIGMENT   Nitrite   (*)    Value: TEST NOT REPORTED DUE TO COLOR INTERFERENCE OF URINE PIGMENT   Leukocytes,Ua   (*)    Value: TEST NOT REPORTED DUE TO COLOR INTERFERENCE OF URINE PIGMENT   All other components within normal limits  URINALYSIS, MICROSCOPIC (REFLEX) - Abnormal; Notable for the following components:   Bacteria, UA MANY (*)    All other components within normal limits  BASIC METABOLIC PANEL - Abnormal; Notable for the following components:   Potassium 3.4 (*)    Glucose, Bld 113 (*)    All other components within normal limits  CBC WITH DIFFERENTIAL/PLATELET - Abnormal; Notable for the following components:   WBC 2.7 (*)    Hemoglobin 11.9 (*)    Neutro Abs 1.1 (*)    All other components within normal limits  URINE CULTURE    EKG None  Radiology No  results found.\  IR Transcath/Emboliz  Result Date: 07/08/2019 CLINICAL DATA:  Patient with history of ischemic stroke. 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 EXAM: TRANSCATHETER THERAPY EMBOLIZATION COMPARISON:  CT angiogram of the head and neck of May 07, 2019. MEDICATIONS: Heparin 4,000 units IV. Ancef 2 g IV antibiotic was administered within 1 hour of the procedure. ANESTHESIA/SEDATION: General anesthesia CONTRAST:  Isovue 300 approximately 75 mL FLUOROSCOPY TIME:  Fluoroscopy Time: 47 minutes 30 seconds (2986 mGy). COMPLICATIONS: None immediate. TECHNIQUE: Informed written consent was obtained from the patient after a thorough discussion of the procedural risks, benefits and alternatives. All questions were addressed. Maximal Sterile Barrier Technique was utilized including caps, mask, sterile gowns, sterile gloves, sterile drape, hand hygiene and skin antiseptic. A timeout was performed prior to the initiation of the procedure. The right forearm to the wrist was prepped and draped in the usual sterile manner. The right radial artery was then identified and its morphology documented with ultrasound examination. A dorsal palmar anastomosis was verified to be present. Using ultrasound guidance and a micropuncture set access into the right radial artery was obtained without difficulty. Over a 0.018 inch micro guidewire, a 6/7 French radial sheath was then inserted. The obturator and the micro guidewire were removed. Good aspiration was obtained from the side port of the sheath. A cocktail of 2000 units of heparin, 2.5 mg of verapamil and 200 mcg of nitroglycerin was then infused in diluted form without event. A right radial arteriogram was obtained. Over a  0.035 inch Roadrunner guidewire, a 5 [place] Simmons 2 diagnostic catheter was advanced to the aortic arch region, and cannulations were performed  of the left vertebral artery, the right common artery and the left common carotid artery. Arteriograms were obtained in these vessels. Also obtained was a 3D rotational arteriogram intracranially from the left common carotid artery. 3D reconstruction was then performed of the left anterior circulation. FINDINGS: The left vertebral artery origin is widely patent. The vessel is seen to opacify to the cranial skull base. Wide patency is seen of the left posterior-inferior cerebellar artery and the left vertebrobasilar junction. The opacified portion of the basilar artery, the posterior cerebral arteries, the superior cerebellar arteries and the anterior-inferior cerebellar arteries is noted into the capillary and venous phases. Unopacified blood is seen in the basilar artery from the contralateral vertebral artery. The innominate arteriogram demonstrates the origin of the right subclavian artery and the right common carotid artery to be widely patent. The origin of the right vertebral artery is widely patent. The vessel is seen to opacify to the cranial skull base. The right common carotid arteriogram demonstrates the right external carotid artery and its major branches to be widely patent. The right internal carotid artery at the bulb to the cranial skull base is widely patent. The petrous, cavernous and supraclinoid segments are widely patent. A right posterior communicating artery is seen opacifying the right posterior cerebral artery distribution. The right middle cerebral artery and the right anterior cerebral artery opacify into the capillary and venous phases. The left common carotid arteriogram demonstrates the left external carotid artery and its major branches to be widely patent. The left internal carotid artery at the bulb to the cranial skull base also demonstrates wide patency. The petrous, the cavernous and the supraclinoid segments are widely patent. A left posterior communicating artery is seen  opacifying the left posterior cerebral artery territory. 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 The 3D reconstruction images demonstrate the aneurysm to arise from the upper most proximal aspect of the left posterior communicating artery, just at its origin from the internal carotid artery. The left middle cerebral artery and the left anterior cerebral artery opacify into the capillary and venous phases. ENDOVASCULAR EMBOLIZATION OF WIDE NECK LEFT INTERNAL CAROTID ARTERY, POSTERIOR COMMUNICATING ARTERY REGION ANEURYSM. The diagnostic 5 French Simmons 2 catheter in the left common carotid artery was then exchanged over a 0.035 inch 300 cm Rosen exchange guidewire for a 95 cm 7 French RIST sheath. Guidewire was removed. Good aspiration obtained from the 7 French RIST sheath. A gentle control arteriogram performed through this demonstrated no evidence of spasms, dissections or of intraluminal filling defects. Over a 0.035 inch Roadrunner guidewire, a 5 French 115 cm Catalyst guide catheter was then advanced to the horizontal petrous segment of the left internal carotid artery, with the advancement of the 7 French sheath to the cervical petrous junction. The guidewire was removed. Good aspiration obtained from the hub of the 5 [place] Catalyst guide catheter in the left internal carotid artery. Control arteriogram demonstrated no evidence of intraluminal filling defects or of occlusions. Following measurements of the left internal carotid artery at the terminus, and the distal cavernous segment of the left internal carotid artery of the landing zones, it was decided to use a 3.5 mm x 12 mm pipeline flex flow diverter device for embolization treatment. Over a  0.014 inch standard Synchro micro guidewire with a J-tip configuration, a 150 cm 027 Phenom microcatheter was advanced to the distal  end of the Catalyst guide catheter. With the micro guidewire leading with a J-tip configuration, the combination was navigated without difficulty with a torque device to the M2 M3 region of the inferior division of the left middle cerebral artery. The guidewire was removed. Good aspiration obtained from the Phenom microcatheter. Gentle control arteriogram performed through this demonstrated safe position of tip of the microcatheter which was then connected to continuous heparinized saline infusion. The housing of the 3.5 mm x 12 mm device was then purged with heparinized saline infusion. Under intermittent fluoroscopy, this was then advanced to the distal end of the microcatheter. The O ring on the delivery microcatheter was loosened. With slight forward gentle traction with the right hand on the delivery micro guidewire, with the left hand the delivery microcatheter was unsheathed unsheathing the distal wire, and also the distal few mm of the device. The cigar configuration changed through expansion of the device distally. The combination was then retrieved to the distal landing zone which was at the left internal carotid artery terminus. Thereafter, the micro guidewire was then advanced unsheathing the device distally. More of the device was then deployed in the manner described advancing the micro guidewire, and maintaining the microcatheter centralized in the native lumen. Once the entire device have been deployed, the microcatheter was gently advanced through the device and then retrieved. Control arteriogram performed through the Catalyst guide catheter demonstrated excellent apposition and deployment of the device with stasis being seen in the aneurysm itself. Under constant fluoroscopic guidance, the delivery micro guidewire and the catheter were retrieved and removed. A control arteriogram performed through the 5 [place] Catalyst guide catheter at 15 and 30 minutes post deployment continued to demonstrate  excellent flow through the device with partial stasis in the aneurysm itself. Patency of the left posterior communicating artery was maintained. A final control arteriogram performed through the RIST sheath demonstrated excellent flow through the internal carotid artery and the through the device. No evidence of intraluminal filling defects or of occlusion was seen. The left internal carotid artery extra cranially remained widely patent also. The 7 French long sheath was then retrieved and removed. The 6/7 radial sheath was then removed with the successful application of a wrist band for hemostasis in the right radial artery puncture site. Distal right radial pulse was verified to be present. Prior to removal, the patient was also given a total of 4.5 mg of Integrilin intra-arterially to ensure no platelet aggregation in the device itself. The patient's general anesthesia was then reversed and the patient was then extubated without difficulty. Upon recovery, the patient complained of a sore throat but, otherwise, remained oriented with normal speech without any motor abnormalities. She was then transferred to the PACU and then to the neuro ICU to continue on low-dose IV heparin, close monitoring of her blood pressure, and close neurological checks. Overnight the patient exhibited no complications. The following morning, the IV heparin was stopped and the patient was switched to aspirin 325 mg a day, and Plavix 75 mg a day. The right wrist puncture site remained soft with the right radial artery pulse being present. She was then mobilized independently and able to tolerate overall intake. She was then discharged home under the care of family with special instructions to maintain adequate hydration, to take her medications without delay. She was advised to refrain from driving for  2 weeks, avoid stooping, bending or lifting weights above 10 pounds. She was advised also to maintain adequate hydration by drinking water.  Should she develop any symptoms of gait instability, motor symptoms, speech difficulties or vision symptoms she was advised to call 911. She expressed understanding and agreement with the above management plan. 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 PLAN: Follow-up in the clinic 2 weeks post discharge. Electronically Signed   By: Luanne Bras M.D.   On: 07/07/2019 15:47   IR Angiogram Follow Up Study  Result Date: 07/08/2019 CLINICAL DATA:  Patient with history of ischemic stroke. 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 EXAM: TRANSCATHETER THERAPY EMBOLIZATION COMPARISON:  CT angiogram of the head and neck of May 07, 2019. MEDICATIONS: Heparin 4,000 units IV. Ancef 2 g IV antibiotic was administered within 1 hour of the procedure. ANESTHESIA/SEDATION: General anesthesia CONTRAST:  Isovue 300 approximately 75 mL FLUOROSCOPY TIME:  Fluoroscopy Time: 47 minutes 30 seconds (2986 mGy). COMPLICATIONS: None immediate. TECHNIQUE: Informed written consent was obtained from the patient after a thorough discussion of the procedural risks, benefits and alternatives. All questions were addressed. Maximal Sterile Barrier Technique was utilized including caps, mask, sterile gowns, sterile gloves, sterile drape, hand hygiene and skin antiseptic. A timeout was performed prior to the initiation of the procedure. The right forearm to the wrist was prepped and draped in the usual sterile manner. The right radial artery was then identified and its morphology documented with ultrasound examination. A dorsal palmar anastomosis was verified to be present. Using ultrasound guidance and a micropuncture set access into the right radial artery was obtained without difficulty.  Over a 0.018 inch micro guidewire, a 6/7 French radial sheath was then inserted. The obturator and the micro guidewire were removed. Good aspiration was obtained from the side port of the sheath. A cocktail of 2000 units of heparin, 2.5 mg of verapamil and 200 mcg of nitroglycerin was then infused in diluted form without event. A right radial arteriogram was obtained. Over a 0.035 inch Roadrunner guidewire, a 5 [place] Simmons 2 diagnostic catheter was advanced to the aortic arch region, and cannulations were performed of the left vertebral artery, the right common artery and the left common carotid artery. Arteriograms were obtained in these vessels. Also obtained was a 3D rotational arteriogram intracranially from the left common carotid artery. 3D reconstruction was then performed of the left anterior circulation. FINDINGS: The left vertebral artery origin is widely patent. The vessel is seen to opacify to the cranial skull base. Wide patency is seen of the left posterior-inferior cerebellar artery and the left vertebrobasilar junction. The opacified portion of the basilar artery, the posterior cerebral arteries, the superior cerebellar arteries and the anterior-inferior cerebellar arteries is noted into the capillary and venous phases. Unopacified blood is seen in the basilar artery from the contralateral vertebral artery. The innominate arteriogram demonstrates the origin of the right subclavian artery and the right common carotid artery to be widely patent. The origin of the right vertebral artery is widely patent. The vessel is seen to opacify to the cranial skull base. The right common carotid arteriogram demonstrates the right external carotid artery and its major branches to be widely patent. The right internal carotid artery at the bulb to the cranial skull base is widely  patent. The petrous, cavernous and supraclinoid segments are widely patent. A right posterior communicating artery is seen opacifying  the right posterior cerebral artery distribution. The right middle cerebral artery and the right anterior cerebral artery opacify into the capillary and venous phases. The left common carotid arteriogram demonstrates the left external carotid artery and its major branches to be widely patent. The left internal carotid artery at the bulb to the cranial skull base also demonstrates wide patency. The petrous, the cavernous and the supraclinoid segments are widely patent. A left posterior communicating artery is seen opacifying the left posterior cerebral artery territory. 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 The 3D reconstruction images demonstrate the aneurysm to arise from the upper most proximal aspect of the left posterior communicating artery, just at its origin from the internal carotid artery. The left middle cerebral artery and the left anterior cerebral artery opacify into the capillary and venous phases. ENDOVASCULAR EMBOLIZATION OF WIDE NECK LEFT INTERNAL CAROTID ARTERY, POSTERIOR COMMUNICATING ARTERY REGION ANEURYSM. The diagnostic 5 French Simmons 2 catheter in the left common carotid artery was then exchanged over a 0.035 inch 300 cm Rosen exchange guidewire for a 95 cm 7 French RIST sheath. Guidewire was removed. Good aspiration obtained from the 7 French RIST sheath. A gentle control arteriogram performed through this demonstrated no evidence of spasms, dissections or of intraluminal filling defects. Over a 0.035 inch Roadrunner guidewire, a 5 French 115 cm Catalyst guide catheter was then advanced to the horizontal petrous segment of the left internal carotid artery, with the advancement of the 7 French sheath to the cervical petrous junction. The guidewire was removed. Good aspiration obtained from the hub of the 5 [place] Catalyst guide catheter in the left internal  carotid artery. Control arteriogram demonstrated no evidence of intraluminal filling defects or of occlusions. Following measurements of the left internal carotid artery at the terminus, and the distal cavernous segment of the left internal carotid artery of the landing zones, it was decided to use a 3.5 mm x 12 mm pipeline flex flow diverter device for embolization treatment. Over a 0.014 inch standard Synchro micro guidewire with a J-tip configuration, a 150 cm 027 Phenom microcatheter was advanced to the distal end of the Catalyst guide catheter. With the micro guidewire leading with a J-tip configuration, the combination was navigated without difficulty with a torque device to the M2 M3 region of the inferior division of the left middle cerebral artery. The guidewire was removed. Good aspiration obtained from the Phenom microcatheter. Gentle control arteriogram performed through this demonstrated safe position of tip of the microcatheter which was then connected to continuous heparinized saline infusion. The housing of the 3.5 mm x 12 mm device was then purged with heparinized saline infusion. Under intermittent fluoroscopy, this was then advanced to the distal end of the microcatheter. The O ring on the delivery microcatheter was loosened. With slight forward gentle traction with the right hand on the delivery micro guidewire, with the left hand the delivery microcatheter was unsheathed unsheathing the distal wire, and also the distal few mm of the device. The cigar configuration changed through expansion of the device distally. The combination was then retrieved to the distal landing zone which was at the left internal carotid artery terminus. Thereafter, the micro guidewire was then advanced unsheathing the device distally. More of the device was then deployed in  the manner described advancing the micro guidewire, and maintaining the microcatheter centralized in the native lumen. Once the entire device have  been deployed, the microcatheter was gently advanced through the device and then retrieved. Control arteriogram performed through the Catalyst guide catheter demonstrated excellent apposition and deployment of the device with stasis being seen in the aneurysm itself. Under constant fluoroscopic guidance, the delivery micro guidewire and the catheter were retrieved and removed. A control arteriogram performed through the 5 [place] Catalyst guide catheter at 15 and 30 minutes post deployment continued to demonstrate excellent flow through the device with partial stasis in the aneurysm itself. Patency of the left posterior communicating artery was maintained. A final control arteriogram performed through the RIST sheath demonstrated excellent flow through the internal carotid artery and the through the device. No evidence of intraluminal filling defects or of occlusion was seen. The left internal carotid artery extra cranially remained widely patent also. The 7 French long sheath was then retrieved and removed. The 6/7 radial sheath was then removed with the successful application of a wrist band for hemostasis in the right radial artery puncture site. Distal right radial pulse was verified to be present. Prior to removal, the patient was also given a total of 4.5 mg of Integrilin intra-arterially to ensure no platelet aggregation in the device itself. The patient's general anesthesia was then reversed and the patient was then extubated without difficulty. Upon recovery, the patient complained of a sore throat but, otherwise, remained oriented with normal speech without any motor abnormalities. She was then transferred to the PACU and then to the neuro ICU to continue on low-dose IV heparin, close monitoring of her blood pressure, and close neurological checks. Overnight the patient exhibited no complications. The following morning, the IV heparin was stopped and the patient was switched to aspirin 325 mg a day, and  Plavix 75 mg a day. The right wrist puncture site remained soft with the right radial artery pulse being present. She was then mobilized independently and able to tolerate overall intake. She was then discharged home under the care of family with special instructions to maintain adequate hydration, to take her medications without delay. She was advised to refrain from driving for 2 weeks, avoid stooping, bending or lifting weights above 10 pounds. She was advised also to maintain adequate hydration by drinking water. Should she develop any symptoms of gait instability, motor symptoms, speech difficulties or vision symptoms she was advised to call 911. She expressed understanding and agreement with the above management plan. 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 PLAN: Follow-up in the clinic 2 weeks post discharge. Electronically Signed   By: Luanne Bras M.D.   On: 07/07/2019 15:47   IR 3D Independent Darreld Mclean  Result Date: 07/11/2019 CLINICAL DATA:  Patient with history of ischemic stroke. 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  EXAM: TRANSCATHETER THERAPY EMBOLIZATION  COMPARISON:  CT angiogram of the head and neck of May 07, 2019.  MEDICATIONS: Heparin 4,000 units IV. Ancef 2 g IV antibiotic was administered within 1 hour of the procedure.  ANESTHESIA/SEDATION: General anesthesia  CONTRAST:  Isovue 300 approximately 75 mL  FLUOROSCOPY TIME:  Fluoroscopy Time: 47 minutes 30 seconds (2986 mGy).  COMPLICATIONS: None immediate.  TECHNIQUE: Informed written consent was obtained from the  patient after a thorough discussion of the procedural risks, benefits and alternatives. All questions were addressed. Maximal Sterile Barrier Technique was  utilized including caps, mask, sterile gowns, sterile gloves, sterile drape, hand hygiene and skin antiseptic. A timeout was performed prior to the initiation of the procedure.  The right forearm to the wrist was prepped and draped in the usual sterile manner. The right radial artery was then identified and its morphology documented with ultrasound examination.  A dorsal palmar anastomosis was verified to be present. Using ultrasound guidance and a micropuncture set access into the right radial artery was obtained without difficulty. Over a 0.018 inch micro guidewire, a 6/7 French radial sheath was then inserted. The obturator and the micro guidewire were removed. Good aspiration was obtained from the side port of the sheath. A cocktail of 2000 units of heparin, 2.5 mg of verapamil and 200 mcg of nitroglycerin was then infused in diluted form without event. A right radial arteriogram was obtained.  Over a 0.035 inch Roadrunner guidewire, a 5 [place] Simmons 2 diagnostic catheter was advanced to the aortic arch region, and cannulations were performed of the left vertebral artery, the right common artery and the left common carotid artery. Arteriograms were obtained in these vessels. Also obtained was a 3D rotational arteriogram intracranially from the left common carotid artery. 3D reconstruction was then performed of the left anterior circulation.  FINDINGS: The left vertebral artery origin is widely patent.  The vessel is seen to opacify to the cranial skull base. Wide patency is seen of the left posterior-inferior cerebellar artery and the left vertebrobasilar junction.  The opacified portion of the basilar artery, the posterior cerebral arteries, the superior cerebellar arteries and the anterior-inferior cerebellar arteries is noted into the capillary and venous phases. Unopacified blood is seen in the basilar artery from the contralateral vertebral artery.  The innominate arteriogram demonstrates the  origin of the right subclavian artery and the right common carotid artery to be widely patent.  The origin of the right vertebral artery is widely patent. The vessel is seen to opacify to the cranial skull base.  The right common carotid arteriogram demonstrates the right external carotid artery and its major branches to be widely patent.  The right internal carotid artery at the bulb to the cranial skull base is widely patent.  The petrous, cavernous and supraclinoid segments are widely patent.  A right posterior communicating artery is seen opacifying the right posterior cerebral artery distribution.  The right middle cerebral artery and the right anterior cerebral artery opacify into the capillary and venous phases.  The left common carotid arteriogram demonstrates the left external carotid artery and its major branches to be widely patent.  The left internal carotid artery at the bulb to the cranial skull base also demonstrates wide patency. The petrous, the cavernous and the supraclinoid segments are widely patent.  A left posterior communicating artery is seen opacifying the left posterior cerebral artery territory.  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 The 3D reconstruction images demonstrate the aneurysm to arise from the upper most proximal aspect of the left posterior communicating artery, just at its origin from the internal carotid artery.  The left middle cerebral artery and the left anterior cerebral artery opacify into the capillary and venous phases.  ENDOVASCULAR EMBOLIZATION OF WIDE NECK LEFT INTERNAL CAROTID ARTERY, POSTERIOR COMMUNICATING ARTERY REGION ANEURYSM.  The diagnostic 5 French Simmons 2 catheter in the left common carotid artery was then exchanged over a 0.035 inch 300 cm Rosen exchange guidewire for a 95 cm 7 French RIST sheath. Guidewire  was removed. Good aspiration obtained from the 7 French RIST sheath. A gentle control arteriogram performed through this demonstrated no evidence of spasms, dissections or of intraluminal filling defects. Over a 0.035 inch Roadrunner guidewire, a 5 French 115 cm Catalyst guide catheter was then advanced to the horizontal petrous segment of the left internal carotid artery, with the advancement of the 7 French sheath to the cervical petrous junction.  The guidewire was removed. Good aspiration obtained from the hub of the 5 [place] Catalyst guide catheter in the left internal carotid artery. Control arteriogram demonstrated no evidence of intraluminal filling defects or of occlusions.  Following measurements of the left internal carotid artery at the terminus, and the distal cavernous segment of the left internal carotid artery of the landing zones, it was decided to use a 3.5 mm x 12 mm pipeline flex flow diverter device for embolization treatment. Over a 0.014 inch standard Synchro micro guidewire with a J-tip configuration, a 150 cm 027 Phenom microcatheter was advanced to the distal end of the Catalyst guide catheter.  With the micro guidewire leading with a J-tip configuration, the combination was navigated without difficulty with a torque device to the M2 M3 region of the inferior division of the left middle cerebral artery.  The guidewire was removed. Good aspiration obtained from the Phenom microcatheter. Gentle control arteriogram performed through this demonstrated safe position of tip of the microcatheter which was then connected to continuous heparinized saline infusion.  The housing of the 3.5 mm x 12 mm device was then purged with heparinized saline infusion.  Under intermittent fluoroscopy, this was then advanced to the distal end of the microcatheter. The O ring on the delivery microcatheter was loosened. With slight forward gentle traction with the right hand on the delivery micro guidewire,  with the left hand the delivery microcatheter was unsheathed unsheathing the distal wire, and also the distal few mm of the device.  The cigar configuration changed through expansion of the device distally.  The combination was then retrieved to the distal landing zone which was at the left internal carotid artery terminus. Thereafter, the micro guidewire was then advanced unsheathing the device distally. More of the device was then deployed in the manner described advancing the micro guidewire, and maintaining the microcatheter centralized in the native lumen.  Once the entire device have been deployed, the microcatheter was gently advanced through the device and then retrieved.  Control arteriogram performed through the Catalyst guide catheter demonstrated excellent apposition and deployment of the device with stasis being seen in the aneurysm itself.  Under constant fluoroscopic guidance, the delivery micro guidewire and the catheter were retrieved and removed. A control arteriogram performed through the 5 [place] Catalyst guide catheter at 15 and 30 minutes post deployment continued to demonstrate excellent flow through the device with partial stasis in the aneurysm itself. Patency of the left posterior communicating artery was maintained.  A final control arteriogram performed through the RIST sheath demonstrated excellent flow through the internal carotid artery and the through the device. No evidence of intraluminal filling defects or of occlusion was seen.  The left internal carotid artery extra cranially remained widely patent also.  The 7 French long sheath was then retrieved and removed.  The 6/7 radial sheath was then removed with the  successful application of a wrist band for hemostasis in the right radial artery puncture site. Distal right radial pulse was verified to be present.  Prior to removal, the patient was also given a total of 4.5 mg of Integrilin intra-arterially to ensure no  platelet aggregation in the device itself.  The patient's general anesthesia was then reversed and the patient was then extubated without difficulty. Upon recovery, the patient complained of a sore throat but, otherwise, remained oriented with normal speech without any motor abnormalities.  She was then transferred to the PACU and then to the neuro ICU to continue on low-dose IV heparin, close monitoring of her blood pressure, and close neurological checks. Overnight the patient exhibited no complications. The following morning, the IV heparin was stopped and the patient was switched to aspirin 325 mg a day, and Plavix 75 mg a day. The right wrist puncture site remained soft with the right radial artery pulse being present.  She was then mobilized independently and able to tolerate overall intake. She was then discharged home under the care of family with special instructions to maintain adequate hydration, to take her medications without delay.  She was advised to refrain from driving for 2 weeks, avoid stooping, bending or lifting weights above 10 pounds.  She was advised also to maintain adequate hydration by drinking water.  Should she develop any symptoms of gait instability, motor symptoms, speech difficulties or vision symptoms she was advised to call 911.  She expressed understanding and agreement with the above management plan.  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  PLAN: Follow-up in the clinic 2 weeks post discharge.   Electronically Signed   By: Luanne Bras M.D.   On: 07/07/2019 15:47   IR US Guide Vasc Access Right  Result Date: 07/08/2019 CLINICAL DATA:  Patient with history of ischemic stroke. Workup revealed presence of a lobulated aneurysm arising from the left internal carotid artery at the level of the posterior  communicating artery. EXAM: TRANSCATHETER THERAPY EMBOLIZATION COMPARISON:  CT angiogram of the head and neck of May 07, 2019. MEDICATIONS: Heparin 4,000 units IV. Ancef 2 g IV antibiotic was administered within 1 hour of the procedure. ANESTHESIA/SEDATION: General anesthesia CONTRAST:  Isovue 300 approximately 75 mL FLUOROSCOPY TIME:  Fluoroscopy Time: 47 minutes 30 seconds (2986 mGy). COMPLICATIONS: None immediate. TECHNIQUE: Informed written consent was obtained from the patient after a thorough discussion of the procedural risks, benefits and alternatives. All questions were addressed. Maximal Sterile Barrier Technique was utilized including caps, mask, sterile gowns, sterile gloves, sterile drape, hand hygiene and skin antiseptic. A timeout was performed prior to the initiation of the procedure. The right forearm to the wrist was prepped and draped in the usual sterile manner. The right radial artery was then identified and its morphology documented with ultrasound examination. A dorsal palmar anastomosis was verified to be present. Using ultrasound guidance and a micropuncture set access into the right radial artery was obtained without difficulty. Over a 0.018 inch micro guidewire, a 6/7 French radial sheath was then inserted. The obturator and the micro guidewire were removed. Good aspiration was obtained from the side port of the sheath. A cocktail of 2000 units of heparin, 2.5 mg of verapamil and 200 mcg of nitroglycerin was then infused in diluted form without event. A right radial arteriogram was obtained. Over a 0.035 inch Roadrunner guidewire, a  5 [place] Simmons 2 diagnostic catheter was advanced to the aortic arch region, and cannulations were performed of the left vertebral artery, the right common artery and the left common carotid artery. Arteriograms were obtained in these vessels. Also obtained was a 3D rotational arteriogram intracranially from the left common carotid artery. 3D reconstruction  was then performed of the left anterior circulation. FINDINGS: The left vertebral artery origin is widely patent. The vessel is seen to opacify to the cranial skull base. Wide patency is seen of the left posterior-inferior cerebellar artery and the left vertebrobasilar junction. The opacified portion of the basilar artery, the posterior cerebral arteries, the superior cerebellar arteries and the anterior-inferior cerebellar arteries is noted into the capillary and venous phases. Unopacified blood is seen in the basilar artery from the contralateral vertebral artery. The innominate arteriogram demonstrates the origin of the right subclavian artery and the right common carotid artery to be widely patent. The origin of the right vertebral artery is widely patent. The vessel is seen to opacify to the cranial skull base. The right common carotid arteriogram demonstrates the right external carotid artery and its major branches to be widely patent. The right internal carotid artery at the bulb to the cranial skull base is widely patent. The petrous, cavernous and supraclinoid segments are widely patent. A right posterior communicating artery is seen opacifying the right posterior cerebral artery distribution. The right middle cerebral artery and the right anterior cerebral artery opacify into the capillary and venous phases. The left common carotid arteriogram demonstrates the left external carotid artery and its major branches to be widely patent. The left internal carotid artery at the bulb to the cranial skull base also demonstrates wide patency. The petrous, the cavernous and the supraclinoid segments are widely patent. A left posterior communicating artery is seen opacifying the left posterior cerebral artery territory. 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 The 3D reconstruction images  demonstrate the aneurysm to arise from the upper most proximal aspect of the left posterior communicating artery, just at its origin from the internal carotid artery. The left middle cerebral artery and the left anterior cerebral artery opacify into the capillary and venous phases. ENDOVASCULAR EMBOLIZATION OF WIDE NECK LEFT INTERNAL CAROTID ARTERY, POSTERIOR COMMUNICATING ARTERY REGION ANEURYSM. The diagnostic 5 [place] Simmons 2 catheter in the left common carotid artery was then exchanged over a 0.035 inch 300 cm Rosen exchange guidewire for a 95 cm 7 French RIST sheath. Guidewire was removed. Good aspiration obtained from the 7 French RIST sheath. A gentle control arteriogram performed through this demonstrated no evidence of spasms, dissections or of intraluminal filling defects. Over a 0.035 inch Roadrunner guidewire, a 5 French 115 cm Catalyst guide catheter was then advanced to the horizontal petrous segment of the left internal carotid artery, with the advancement of the 7 French sheath to the cervical petrous junction. The guidewire was removed. Good aspiration obtained from the hub of the 5 [place] Catalyst guide catheter in the left internal carotid artery. Control arteriogram demonstrated no evidence of intraluminal filling defects or of occlusions. Following measurements of the left internal carotid artery at the terminus, and the distal cavernous segment of the left internal carotid artery of the landing zones, it was decided to use a 3.5 mm x 12 mm pipeline flex flow diverter device for embolization treatment. Over a 0.014 inch standard Synchro micro  guidewire with a J-tip configuration, a 150 cm 027 Phenom microcatheter was advanced to the distal end of the Catalyst guide catheter. With the micro guidewire leading with a J-tip configuration, the combination was navigated without difficulty with a torque device to the M2 M3 region of the inferior division of the left middle cerebral artery. The  guidewire was removed. Good aspiration obtained from the Phenom microcatheter. Gentle control arteriogram performed through this demonstrated safe position of tip of the microcatheter which was then connected to continuous heparinized saline infusion. The housing of the 3.5 mm x 12 mm device was then purged with heparinized saline infusion. Under intermittent fluoroscopy, this was then advanced to the distal end of the microcatheter. The O ring on the delivery microcatheter was loosened. With slight forward gentle traction with the right hand on the delivery micro guidewire, with the left hand the delivery microcatheter was unsheathed unsheathing the distal wire, and also the distal few mm of the device. The cigar configuration changed through expansion of the device distally. The combination was then retrieved to the distal landing zone which was at the left internal carotid artery terminus. Thereafter, the micro guidewire was then advanced unsheathing the device distally. More of the device was then deployed in the manner described advancing the micro guidewire, and maintaining the microcatheter centralized in the native lumen. Once the entire device have been deployed, the microcatheter was gently advanced through the device and then retrieved. Control arteriogram performed through the Catalyst guide catheter demonstrated excellent apposition and deployment of the device with stasis being seen in the aneurysm itself. Under constant fluoroscopic guidance, the delivery micro guidewire and the catheter were retrieved and removed. A control arteriogram performed through the 5 [place] Catalyst guide catheter at 15 and 30 minutes post deployment continued to demonstrate excellent flow through the device with partial stasis in the aneurysm itself. Patency of the left posterior communicating artery was maintained. A final control arteriogram performed through the RIST sheath demonstrated excellent flow through the  internal carotid artery and the through the device. No evidence of intraluminal filling defects or of occlusion was seen. The left internal carotid artery extra cranially remained widely patent also. The 7 French long sheath was then retrieved and removed. The 6/7 radial sheath was then removed with the successful application of a wrist band for hemostasis in the right radial artery puncture site. Distal right radial pulse was verified to be present. Prior to removal, the patient was also given a total of 4.5 mg of Integrilin intra-arterially to ensure no platelet aggregation in the device itself. The patient's general anesthesia was then reversed and the patient was then extubated without difficulty. Upon recovery, the patient complained of a sore throat but, otherwise, remained oriented with normal speech without any motor abnormalities. She was then transferred to the PACU and then to the neuro ICU to continue on low-dose IV heparin, close monitoring of her blood pressure, and close neurological checks. Overnight the patient exhibited no complications. The following morning, the IV heparin was stopped and the patient was switched to aspirin 325 mg a day, and Plavix 75 mg a day. The right wrist puncture site remained soft with the right radial artery pulse being present. She was then mobilized independently and able to tolerate overall intake. She was then discharged home under the care of family with special instructions to maintain adequate hydration, to take her medications without delay. She was advised to refrain from driving for 2 weeks, avoid stooping, bending  or lifting weights above 10 pounds. She was advised also to maintain adequate hydration by drinking water. Should she develop any symptoms of gait instability, motor symptoms, speech difficulties or vision symptoms she was advised to call 911. She expressed understanding and agreement with the above management plan. 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 PLAN: Follow-up in the clinic 2 weeks post discharge. Electronically Signed   By: Luanne Bras M.D.   On: 07/07/2019 15:47   CT Renal Stone Study  Result Date: 07/23/2019 CLINICAL DATA:  59 year old female acute LEFT flank and abdominal pain with hematuria. EXAM: CT ABDOMEN AND PELVIS WITHOUT CONTRAST TECHNIQUE: Multidetector CT imaging of the abdomen and pelvis was performed following the standard protocol without IV contrast. COMPARISON:  None. FINDINGS: Please note that parenchymal abnormalities may be missed without intravenous contrast. Lower chest: No acute abnormality. Hepatobiliary: Liver is unremarkable. The patient is status post cholecystectomy. No biliary dilatation. Pancreas: Unremarkable Spleen: Unremarkable Adrenals/Urinary Tract: Punctate nonobstructing bilateral renal calculi are identified, 2 on the RIGHT and 3 on the LEFT. There is no evidence of hydronephrosis or obstructing urinary calculi. The adrenal glands and bladder are unremarkable. Stomach/Bowel: Stomach is within normal limits. Appendix appears normal. No evidence of bowel wall thickening, distention, or inflammatory changes. Vascular/Lymphatic: No significant vascular findings are present. No enlarged abdominal or pelvic lymph nodes. Reproductive: Status post hysterectomy. No adnexal masses. Other: No ascites, focal collection or pneumoperitoneum. Musculoskeletal: No acute or suspicious bony abnormality. Moderate degenerative disc disease at L5-S1 noted. IMPRESSION: 1. No evidence of acute abnormality. 2. Punctate nonobstructing bilateral renal calculi. Electronically Signed   By: Margarette Canada M.D.   On: 07/23/2019 11:58   IR ANGIO INTRA EXTRACRAN SEL COM CAROTID INNOMINATE UNI R MOD SED  Result Date: 07/08/2019 CLINICAL DATA:  Patient with history of ischemic  stroke. 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 EXAM: TRANSCATHETER THERAPY EMBOLIZATION COMPARISON:  CT angiogram of the head and neck of May 07, 2019. MEDICATIONS: Heparin 4,000 units IV. Ancef 2 g IV antibiotic was administered within 1 hour of the procedure. ANESTHESIA/SEDATION: General anesthesia CONTRAST:  Isovue 300 approximately 75 mL FLUOROSCOPY TIME:  Fluoroscopy Time: 47 minutes 30 seconds (2986 mGy). COMPLICATIONS: None immediate. TECHNIQUE: Informed written consent was obtained from the patient after a thorough discussion of the procedural risks, benefits and alternatives. All questions were addressed. Maximal Sterile Barrier Technique was utilized including caps, mask, sterile gowns, sterile gloves, sterile drape, hand hygiene and skin antiseptic. A timeout was performed prior to the initiation of the procedure. The right forearm to the wrist was prepped and draped in the usual sterile manner. The right radial artery was then identified and its morphology documented with ultrasound examination. A dorsal palmar anastomosis was verified to be present. Using ultrasound guidance and a micropuncture set access into the right radial artery was obtained without difficulty. Over a 0.018 inch micro guidewire, a 6/7 French radial sheath was then inserted. The obturator and the micro guidewire were removed. Good aspiration was obtained from the side port of the sheath. A cocktail of 2000 units of heparin, 2.5 mg of verapamil and 200 mcg of nitroglycerin was then infused in diluted form without event. A right radial arteriogram was obtained. Over a 0.035 inch Roadrunner guidewire, a 5 [place] Simmons 2  diagnostic catheter was advanced to the aortic arch region, and cannulations were performed of the left vertebral artery, the right common artery and the left common carotid artery. Arteriograms were obtained  in these vessels. Also obtained was a 3D rotational arteriogram intracranially from the left common carotid artery. 3D reconstruction was then performed of the left anterior circulation. FINDINGS: The left vertebral artery origin is widely patent. The vessel is seen to opacify to the cranial skull base. Wide patency is seen of the left posterior-inferior cerebellar artery and the left vertebrobasilar junction. The opacified portion of the basilar artery, the posterior cerebral arteries, the superior cerebellar arteries and the anterior-inferior cerebellar arteries is noted into the capillary and venous phases. Unopacified blood is seen in the basilar artery from the contralateral vertebral artery. The innominate arteriogram demonstrates the origin of the right subclavian artery and the right common carotid artery to be widely patent. The origin of the right vertebral artery is widely patent. The vessel is seen to opacify to the cranial skull base. The right common carotid arteriogram demonstrates the right external carotid artery and its major branches to be widely patent. The right internal carotid artery at the bulb to the cranial skull base is widely patent. The petrous, cavernous and supraclinoid segments are widely patent. A right posterior communicating artery is seen opacifying the right posterior cerebral artery distribution. The right middle cerebral artery and the right anterior cerebral artery opacify into the capillary and venous phases. The left common carotid arteriogram demonstrates the left external carotid artery and its major branches to be widely patent. The left internal carotid artery at the bulb to the cranial skull base also demonstrates wide patency. The petrous, the cavernous and the supraclinoid segments are widely patent. A left posterior communicating artery is seen opacifying the left posterior cerebral artery territory. 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 The 3D reconstruction images demonstrate the aneurysm to arise from the upper most proximal aspect of the left posterior communicating artery, just at its origin from the internal carotid artery. The left middle cerebral artery and the left anterior cerebral artery opacify into the capillary and venous phases. ENDOVASCULAR EMBOLIZATION OF WIDE NECK LEFT INTERNAL CAROTID ARTERY, POSTERIOR COMMUNICATING ARTERY REGION ANEURYSM. The diagnostic 5 French Simmons 2 catheter in the left common carotid artery was then exchanged over a 0.035 inch 300 cm Rosen exchange guidewire for a 95 cm 7 French RIST sheath. Guidewire was removed. Good aspiration obtained from the 7 French RIST sheath. A gentle control arteriogram performed through this demonstrated no evidence of spasms, dissections or of intraluminal filling defects. Over a 0.035 inch Roadrunner guidewire, a 5 French 115 cm Catalyst guide catheter was then advanced to the horizontal petrous segment of the left internal carotid artery, with the advancement of the 7 French sheath to the cervical petrous junction. The guidewire was removed. Good aspiration obtained from the hub of the 5 [place] Catalyst guide catheter in the left internal carotid artery. Control arteriogram demonstrated no evidence of intraluminal filling defects or of occlusions. Following measurements of the left internal carotid artery at the terminus, and the distal cavernous segment of the left internal carotid artery of the landing zones, it was decided to use a 3.5 mm x 12 mm pipeline flex flow diverter device for embolization treatment. Over a 0.014 inch standard Synchro micro guidewire with a J-tip  configuration, a 150 cm 027 Phenom microcatheter was advanced to the distal end of the Catalyst guide catheter. With the micro guidewire leading with a J-tip configuration, the combination was  navigated without difficulty with a torque device to the M2 M3 region of the inferior division of the left middle cerebral artery. The guidewire was removed. Good aspiration obtained from the Phenom microcatheter. Gentle control arteriogram performed through this demonstrated safe position of tip of the microcatheter which was then connected to continuous heparinized saline infusion. The housing of the 3.5 mm x 12 mm device was then purged with heparinized saline infusion. Under intermittent fluoroscopy, this was then advanced to the distal end of the microcatheter. The O ring on the delivery microcatheter was loosened. With slight forward gentle traction with the right hand on the delivery micro guidewire, with the left hand the delivery microcatheter was unsheathed unsheathing the distal wire, and also the distal few mm of the device. The cigar configuration changed through expansion of the device distally. The combination was then retrieved to the distal landing zone which was at the left internal carotid artery terminus. Thereafter, the micro guidewire was then advanced unsheathing the device distally. More of the device was then deployed in the manner described advancing the micro guidewire, and maintaining the microcatheter centralized in the native lumen. Once the entire device have been deployed, the microcatheter was gently advanced through the device and then retrieved. Control arteriogram performed through the Catalyst guide catheter demonstrated excellent apposition and deployment of the device with stasis being seen in the aneurysm itself. Under constant fluoroscopic guidance, the delivery micro guidewire and the catheter were retrieved and removed. A control arteriogram performed through the 5 [place] Catalyst guide catheter at 15 and 30 minutes post deployment continued to demonstrate excellent flow through the device with partial stasis in the aneurysm itself. Patency of the left posterior  communicating artery was maintained. A final control arteriogram performed through the RIST sheath demonstrated excellent flow through the internal carotid artery and the through the device. No evidence of intraluminal filling defects or of occlusion was seen. The left internal carotid artery extra cranially remained widely patent also. The 7 French long sheath was then retrieved and removed. The 6/7 radial sheath was then removed with the successful application of a wrist band for hemostasis in the right radial artery puncture site. Distal right radial pulse was verified to be present. Prior to removal, the patient was also given a total of 4.5 mg of Integrilin intra-arterially to ensure no platelet aggregation in the device itself. The patient's general anesthesia was then reversed and the patient was then extubated without difficulty. Upon recovery, the patient complained of a sore throat but, otherwise, remained oriented with normal speech without any motor abnormalities. She was then transferred to the PACU and then to the neuro ICU to continue on low-dose IV heparin, close monitoring of her blood pressure, and close neurological checks. Overnight the patient exhibited no complications. The following morning, the IV heparin was stopped and the patient was switched to aspirin 325 mg a day, and Plavix 75 mg a day. The right wrist puncture site remained soft with the right radial artery pulse being present. She was then mobilized independently and able to tolerate overall intake. She was then discharged home under the care of family with special instructions to maintain adequate hydration, to take her medications without delay. She was advised to refrain from driving for 2 weeks, avoid stooping, bending or lifting weights above  10 pounds. She was advised also to maintain adequate hydration by drinking water. Should she develop any symptoms of gait instability, motor symptoms, speech difficulties or vision symptoms  she was advised to call 911. She expressed understanding and agreement with the above management plan. 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 PLAN: Follow-up in the clinic 2 weeks post discharge. Electronically Signed   By: Luanne Bras M.D.   On: 07/07/2019 15:47   IR ANGIO INTRA EXTRACRAN SEL INTERNAL CAROTID UNI L MOD SED  Result Date: 07/08/2019 CLINICAL DATA:  Patient with history of ischemic stroke. 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 EXAM: TRANSCATHETER THERAPY EMBOLIZATION COMPARISON:  CT angiogram of the head and neck of May 07, 2019. MEDICATIONS: Heparin 4,000 units IV. Ancef 2 g IV antibiotic was administered within 1 hour of the procedure. ANESTHESIA/SEDATION: General anesthesia CONTRAST:  Isovue 300 approximately 75 mL FLUOROSCOPY TIME:  Fluoroscopy Time: 47 minutes 30 seconds (2986 mGy). COMPLICATIONS: None immediate. TECHNIQUE: Informed written consent was obtained from the patient after a thorough discussion of the procedural risks, benefits and alternatives. All questions were addressed. Maximal Sterile Barrier Technique was utilized including caps, mask, sterile gowns, sterile gloves, sterile drape, hand hygiene and skin antiseptic. A timeout was performed prior to the initiation of the procedure. The right forearm to the wrist was prepped and draped in the usual sterile manner. The right radial artery was then identified and its morphology documented with ultrasound examination. A dorsal palmar anastomosis was verified to be present. Using ultrasound guidance and a micropuncture set access into the right radial artery was obtained without difficulty. Over a 0.018 inch micro guidewire, a 6/7 French radial sheath was then  inserted. The obturator and the micro guidewire were removed. Good aspiration was obtained from the side port of the sheath. A cocktail of 2000 units of heparin, 2.5 mg of verapamil and 200 mcg of nitroglycerin was then infused in diluted form without event. A right radial arteriogram was obtained. Over a 0.035 inch Roadrunner guidewire, a 5 [place] Simmons 2 diagnostic catheter was advanced to the aortic arch region, and cannulations were performed of the left vertebral artery, the right common artery and the left common carotid artery. Arteriograms were obtained in these vessels. Also obtained was a 3D rotational arteriogram intracranially from the left common carotid artery. 3D reconstruction was then performed of the left anterior circulation. FINDINGS: The left vertebral artery origin is widely patent. The vessel is seen to opacify to the cranial skull base. Wide patency is seen of the left posterior-inferior cerebellar artery and the left vertebrobasilar junction. The opacified portion of the basilar artery, the posterior cerebral arteries, the superior cerebellar arteries and the anterior-inferior cerebellar arteries is noted into the capillary and venous phases. Unopacified blood is seen in the basilar artery from the contralateral vertebral artery. The innominate arteriogram demonstrates the origin of the right subclavian artery and the right common carotid artery to be widely patent. The origin of the right vertebral artery is widely patent. The vessel is seen to opacify to the cranial skull base. The right common carotid arteriogram demonstrates the right external carotid artery and its major branches to be widely patent. The right internal carotid artery at the bulb to the cranial skull base is widely patent. The petrous,  cavernous and supraclinoid segments are widely patent. A right posterior communicating artery is seen opacifying the right posterior cerebral artery distribution. The right middle  cerebral artery and the right anterior cerebral artery opacify into the capillary and venous phases. The left common carotid arteriogram demonstrates the left external carotid artery and its major branches to be widely patent. The left internal carotid artery at the bulb to the cranial skull base also demonstrates wide patency. The petrous, the cavernous and the supraclinoid segments are widely patent. A left posterior communicating artery is seen opacifying the left posterior cerebral artery territory. 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 The 3D reconstruction images demonstrate the aneurysm to arise from the upper most proximal aspect of the left posterior communicating artery, just at its origin from the internal carotid artery. The left middle cerebral artery and the left anterior cerebral artery opacify into the capillary and venous phases. ENDOVASCULAR EMBOLIZATION OF WIDE NECK LEFT INTERNAL CAROTID ARTERY, POSTERIOR COMMUNICATING ARTERY REGION ANEURYSM. The diagnostic 5 French Simmons 2 catheter in the left common carotid artery was then exchanged over a 0.035 inch 300 cm Rosen exchange guidewire for a 95 cm 7 French RIST sheath. Guidewire was removed. Good aspiration obtained from the 7 French RIST sheath. A gentle control arteriogram performed through this demonstrated no evidence of spasms, dissections or of intraluminal filling defects. Over a 0.035 inch Roadrunner guidewire, a 5 French 115 cm Catalyst guide catheter was then advanced to the horizontal petrous segment of the left internal carotid artery, with the advancement of the 7 French sheath to the cervical petrous junction. The guidewire was removed. Good aspiration obtained from the hub of the 5 [place] Catalyst guide catheter in the left internal carotid artery. Control arteriogram demonstrated no evidence of  intraluminal filling defects or of occlusions. Following measurements of the left internal carotid artery at the terminus, and the distal cavernous segment of the left internal carotid artery of the landing zones, it was decided to use a 3.5 mm x 12 mm pipeline flex flow diverter device for embolization treatment. Over a 0.014 inch standard Synchro micro guidewire with a J-tip configuration, a 150 cm 027 Phenom microcatheter was advanced to the distal end of the Catalyst guide catheter. With the micro guidewire leading with a J-tip configuration, the combination was navigated without difficulty with a torque device to the M2 M3 region of the inferior division of the left middle cerebral artery. The guidewire was removed. Good aspiration obtained from the Phenom microcatheter. Gentle control arteriogram performed through this demonstrated safe position of tip of the microcatheter which was then connected to continuous heparinized saline infusion. The housing of the 3.5 mm x 12 mm device was then purged with heparinized saline infusion. Under intermittent fluoroscopy, this was then advanced to the distal end of the microcatheter. The O ring on the delivery microcatheter was loosened. With slight forward gentle traction with the right hand on the delivery micro guidewire, with the left hand the delivery microcatheter was unsheathed unsheathing the distal wire, and also the distal few mm of the device. The cigar configuration changed through expansion of the device distally. The combination was then retrieved to the distal landing zone which was at the left internal carotid artery terminus. Thereafter, the micro guidewire was then advanced unsheathing the device distally. More of the device was then deployed in the manner described  advancing the micro guidewire, and maintaining the microcatheter centralized in the native lumen. Once the entire device have been deployed, the microcatheter was gently advanced through the  device and then retrieved. Control arteriogram performed through the Catalyst guide catheter demonstrated excellent apposition and deployment of the device with stasis being seen in the aneurysm itself. Under constant fluoroscopic guidance, the delivery micro guidewire and the catheter were retrieved and removed. A control arteriogram performed through the 5 [place] Catalyst guide catheter at 15 and 30 minutes post deployment continued to demonstrate excellent flow through the device with partial stasis in the aneurysm itself. Patency of the left posterior communicating artery was maintained. A final control arteriogram performed through the RIST sheath demonstrated excellent flow through the internal carotid artery and the through the device. No evidence of intraluminal filling defects or of occlusion was seen. The left internal carotid artery extra cranially remained widely patent also. The 7 French long sheath was then retrieved and removed. The 6/7 radial sheath was then removed with the successful application of a wrist band for hemostasis in the right radial artery puncture site. Distal right radial pulse was verified to be present. Prior to removal, the patient was also given a total of 4.5 mg of Integrilin intra-arterially to ensure no platelet aggregation in the device itself. The patient's general anesthesia was then reversed and the patient was then extubated without difficulty. Upon recovery, the patient complained of a sore throat but, otherwise, remained oriented with normal speech without any motor abnormalities. She was then transferred to the PACU and then to the neuro ICU to continue on low-dose IV heparin, close monitoring of her blood pressure, and close neurological checks. Overnight the patient exhibited no complications. The following morning, the IV heparin was stopped and the patient was switched to aspirin 325 mg a day, and Plavix 75 mg a day. The right wrist puncture site remained soft with  the right radial artery pulse being present. She was then mobilized independently and able to tolerate overall intake. She was then discharged home under the care of family with special instructions to maintain adequate hydration, to take her medications without delay. She was advised to refrain from driving for 2 weeks, avoid stooping, bending or lifting weights above 10 pounds. She was advised also to maintain adequate hydration by drinking water. Should she develop any symptoms of gait instability, motor symptoms, speech difficulties or vision symptoms she was advised to call 911. She expressed understanding and agreement with the above management plan. 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 PLAN: Follow-up in the clinic 2 weeks post discharge. Electronically Signed   By: Luanne Bras M.D.   On: 07/07/2019 15:47   IR ANGIO VERTEBRAL SEL VERTEBRAL UNI L MOD SED  Result Date: 07/08/2019 CLINICAL DATA:  Patient with history of ischemic stroke. 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 EXAM: TRANSCATHETER THERAPY EMBOLIZATION COMPARISON:  CT angiogram of the head and neck of May 07, 2019. MEDICATIONS: Heparin 4,000 units IV. Ancef 2 g IV antibiotic was administered within 1 hour of the procedure. ANESTHESIA/SEDATION: General anesthesia CONTRAST:  Isovue 300 approximately 75 mL FLUOROSCOPY TIME:  Fluoroscopy Time: 47 minutes 30 seconds (2986 mGy). COMPLICATIONS: None immediate. TECHNIQUE: Informed written consent was obtained from the patient after a thorough discussion of  the procedural risks, benefits and alternatives. All questions were addressed. Maximal Sterile Barrier Technique was utilized including caps, mask, sterile gowns, sterile gloves,  sterile drape, hand hygiene and skin antiseptic. A timeout was performed prior to the initiation of the procedure. The right forearm to the wrist was prepped and draped in the usual sterile manner. The right radial artery was then identified and its morphology documented with ultrasound examination. A dorsal palmar anastomosis was verified to be present. Using ultrasound guidance and a micropuncture set access into the right radial artery was obtained without difficulty. Over a 0.018 inch micro guidewire, a 6/7 French radial sheath was then inserted. The obturator and the micro guidewire were removed. Good aspiration was obtained from the side port of the sheath. A cocktail of 2000 units of heparin, 2.5 mg of verapamil and 200 mcg of nitroglycerin was then infused in diluted form without event. A right radial arteriogram was obtained. Over a 0.035 inch Roadrunner guidewire, a 5 [place] Simmons 2 diagnostic catheter was advanced to the aortic arch region, and cannulations were performed of the left vertebral artery, the right common artery and the left common carotid artery. Arteriograms were obtained in these vessels. Also obtained was a 3D rotational arteriogram intracranially from the left common carotid artery. 3D reconstruction was then performed of the left anterior circulation. FINDINGS: The left vertebral artery origin is widely patent. The vessel is seen to opacify to the cranial skull base. Wide patency is seen of the left posterior-inferior cerebellar artery and the left vertebrobasilar junction. The opacified portion of the basilar artery, the posterior cerebral arteries, the superior cerebellar arteries and the anterior-inferior cerebellar arteries is noted into the capillary and venous phases. Unopacified blood is seen in the basilar artery from the contralateral vertebral artery. The innominate arteriogram demonstrates the origin of the right subclavian artery and the right common carotid artery to be  widely patent. The origin of the right vertebral artery is widely patent. The vessel is seen to opacify to the cranial skull base. The right common carotid arteriogram demonstrates the right external carotid artery and its major branches to be widely patent. The right internal carotid artery at the bulb to the cranial skull base is widely patent. The petrous, cavernous and supraclinoid segments are widely patent. A right posterior communicating artery is seen opacifying the right posterior cerebral artery distribution. The right middle cerebral artery and the right anterior cerebral artery opacify into the capillary and venous phases. The left common carotid arteriogram demonstrates the left external carotid artery and its major branches to be widely patent. The left internal carotid artery at the bulb to the cranial skull base also demonstrates wide patency. The petrous, the cavernous and the supraclinoid segments are widely patent. A left posterior communicating artery is seen opacifying the left posterior cerebral artery territory. 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 The 3D reconstruction images demonstrate the aneurysm to arise from the upper most proximal aspect of the left posterior communicating artery, just at its origin from the internal carotid artery. The left middle cerebral artery and the left anterior cerebral artery opacify into the capillary and venous phases. ENDOVASCULAR EMBOLIZATION OF WIDE NECK LEFT INTERNAL CAROTID ARTERY, POSTERIOR COMMUNICATING ARTERY REGION ANEURYSM. The diagnostic 5 French Simmons 2 catheter in the left common carotid artery was then exchanged over a 0.035 inch 300 cm Constance Holster exchange guidewire  for a 95 cm 7 French RIST sheath. Guidewire was removed. Good aspiration obtained from the 7 French RIST sheath. A gentle control arteriogram performed  through this demonstrated no evidence of spasms, dissections or of intraluminal filling defects. Over a 0.035 inch Roadrunner guidewire, a 5 French 115 cm Catalyst guide catheter was then advanced to the horizontal petrous segment of the left internal carotid artery, with the advancement of the 7 French sheath to the cervical petrous junction. The guidewire was removed. Good aspiration obtained from the hub of the 5 [place] Catalyst guide catheter in the left internal carotid artery. Control arteriogram demonstrated no evidence of intraluminal filling defects or of occlusions. Following measurements of the left internal carotid artery at the terminus, and the distal cavernous segment of the left internal carotid artery of the landing zones, it was decided to use a 3.5 mm x 12 mm pipeline flex flow diverter device for embolization treatment. Over a 0.014 inch standard Synchro micro guidewire with a J-tip configuration, a 150 cm 027 Phenom microcatheter was advanced to the distal end of the Catalyst guide catheter. With the micro guidewire leading with a J-tip configuration, the combination was navigated without difficulty with a torque device to the M2 M3 region of the inferior division of the left middle cerebral artery. The guidewire was removed. Good aspiration obtained from the Phenom microcatheter. Gentle control arteriogram performed through this demonstrated safe position of tip of the microcatheter which was then connected to continuous heparinized saline infusion. The housing of the 3.5 mm x 12 mm device was then purged with heparinized saline infusion. Under intermittent fluoroscopy, this was then advanced to the distal end of the microcatheter. The O ring on the delivery microcatheter was loosened. With slight forward gentle traction with the right hand on the delivery micro guidewire, with the left hand the delivery microcatheter was unsheathed unsheathing the distal wire, and also the distal few mm of the  device. The cigar configuration changed through expansion of the device distally. The combination was then retrieved to the distal landing zone which was at the left internal carotid artery terminus. Thereafter, the micro guidewire was then advanced unsheathing the device distally. More of the device was then deployed in the manner described advancing the micro guidewire, and maintaining the microcatheter centralized in the native lumen. Once the entire device have been deployed, the microcatheter was gently advanced through the device and then retrieved. Control arteriogram performed through the Catalyst guide catheter demonstrated excellent apposition and deployment of the device with stasis being seen in the aneurysm itself. Under constant fluoroscopic guidance, the delivery micro guidewire and the catheter were retrieved and removed. A control arteriogram performed through the 5 [place] Catalyst guide catheter at 15 and 30 minutes post deployment continued to demonstrate excellent flow through the device with partial stasis in the aneurysm itself. Patency of the left posterior communicating artery was maintained. A final control arteriogram performed through the RIST sheath demonstrated excellent flow through the internal carotid artery and the through the device. No evidence of intraluminal filling defects or of occlusion was seen. The left internal carotid artery extra cranially remained widely patent also. The 7 French long sheath was then retrieved and removed. The 6/7 radial sheath was then removed with the successful application of a wrist band for hemostasis in the right radial artery puncture site. Distal right radial pulse was verified to be present. Prior to removal, the patient was also given a total of 4.5 mg of Integrilin intra-arterially  to ensure no platelet aggregation in the device itself. The patient's general anesthesia was then reversed and the patient was then extubated without difficulty.  Upon recovery, the patient complained of a sore throat but, otherwise, remained oriented with normal speech without any motor abnormalities. She was then transferred to the PACU and then to the neuro ICU to continue on low-dose IV heparin, close monitoring of her blood pressure, and close neurological checks. Overnight the patient exhibited no complications. The following morning, the IV heparin was stopped and the patient was switched to aspirin 325 mg a day, and Plavix 75 mg a day. The right wrist puncture site remained soft with the right radial artery pulse being present. She was then mobilized independently and able to tolerate overall intake. She was then discharged home under the care of family with special instructions to maintain adequate hydration, to take her medications without delay. She was advised to refrain from driving for 2 weeks, avoid stooping, bending or lifting weights above 10 pounds. She was advised also to maintain adequate hydration by drinking water. Should she develop any symptoms of gait instability, motor symptoms, speech difficulties or vision symptoms she was advised to call 911. She expressed understanding and agreement with the above management plan. 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 PLAN: Follow-up in the clinic 2 weeks post discharge. Electronically Signed   By: Luanne Bras M.D.   On: 07/07/2019 15:47    Procedures Procedures (including critical care time)  Medications Ordered in ED Medications - No data to display  ED Course  I have reviewed the triage vital signs and the nursing notes.  Pertinent labs & imaging results that were available during my care of the patient were reviewed by me and considered in my medical decision making (see chart for details).    MDM Rules/Calculators/A&P                           59 year old female with persistent hematuria.  No improvement despite antibiotics.  Needs urology evaluation, possibly in GYN if no clear etiology found.  Final Clinical Impression(s) / ED Diagnoses Final diagnoses:  Hematuria, unspecified type    Rx / DC Orders ED Discharge Orders         Ordered    phenazopyridine (PYRIDIUM) 200 MG tablet  3 times daily PRN     Discontinue  Reprint     07/23/19 1306           Virgel Manifold, MD 07/31/19 1212

## 2019-08-01 ENCOUNTER — Ambulatory Visit
Admission: RE | Admit: 2019-08-01 | Discharge: 2019-08-01 | Disposition: A | Discharge status: Home / Self Care | Source: Ambulatory Visit | Attending: Gastroenterology | Admitting: Gastroenterology

## 2019-08-01 DIAGNOSIS — R1084 Generalized abdominal pain: Secondary | ICD-10-CM

## 2019-08-01 DIAGNOSIS — K219 Gastro-esophageal reflux disease without esophagitis: Secondary | ICD-10-CM

## 2019-08-01 DIAGNOSIS — R634 Abnormal weight loss: Secondary | ICD-10-CM

## 2019-08-01 DIAGNOSIS — R142 Eructation: Secondary | ICD-10-CM

## 2019-08-01 IMAGING — RF DG UGI W/ SMALL BOWEL
8 of 9 series · 14 of 24 positions shown · non-contrast
Comparison: [DATE] CT abdomen/pelvis.

CLINICAL DATA: Gastroesophageal reflux disease. Intermittent mid
abdominal pain.

EXAM:
UPPER GI SERIES WITH SMALL BOWEL FOLLOW-THROUGH WITH KUB
FLUOROSCOPY TIME:  Fluoroscopy Time:  4 minutes 24 seconds
Radiation Exposure Index (if provided by the fluoroscopic device):
[VA] mGy
Number of Acquired Spot Images: 19
TECHNIQUE: Combined double contrast and single contrast upper GI series using
effervescent crystals, thick barium, and thin barium. Subsequently,
serial images of the small bowel were obtained including spot views
of the terminal ileum.

[Series 1: one shot · 0.14mm/px · 3 of 11 slices shown (1 of 3)]
[im 1/11]
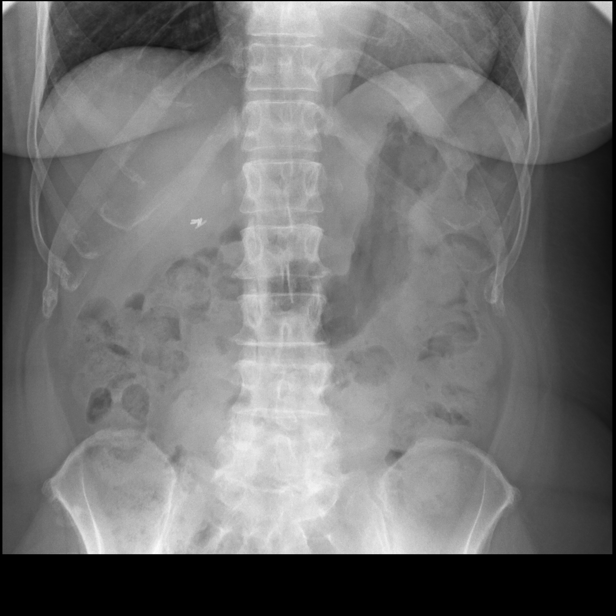
[im 5/11]
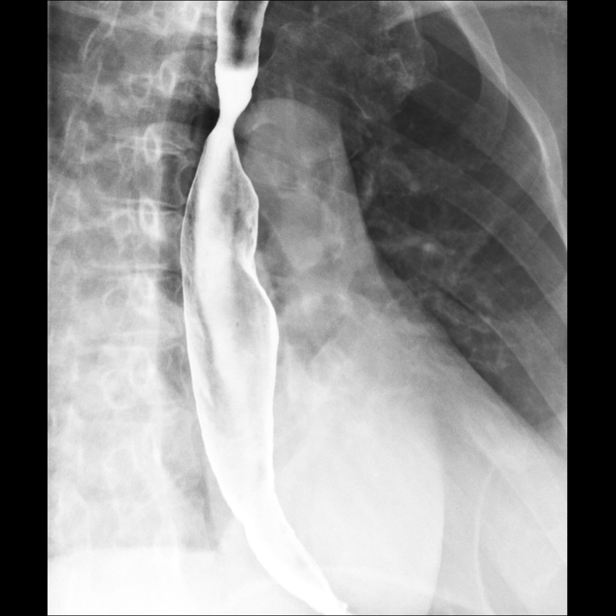
[im 11/11]
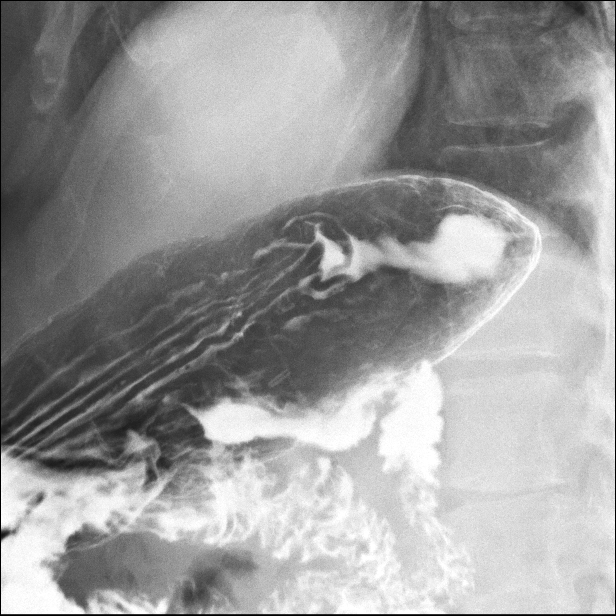

[Series 2: sequence · 1 of 27 frames shown (1 of 5)]
[frame 14/27]
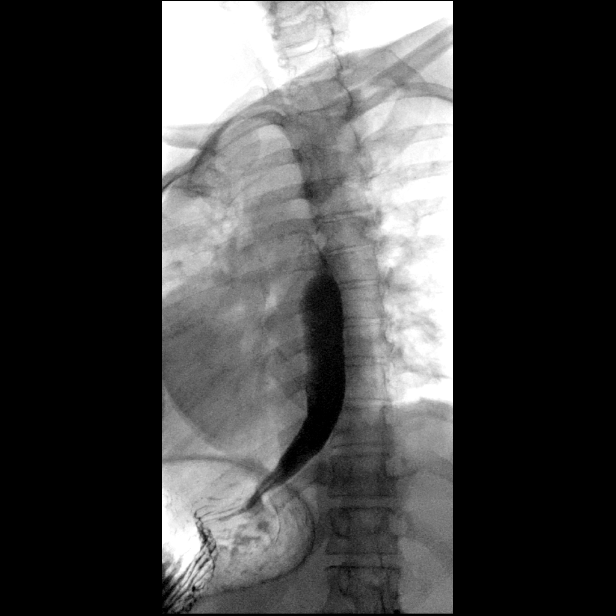

[Series 3: sequence · 2 of 95 frames shown (2 of 5)]
[frame 15/95]
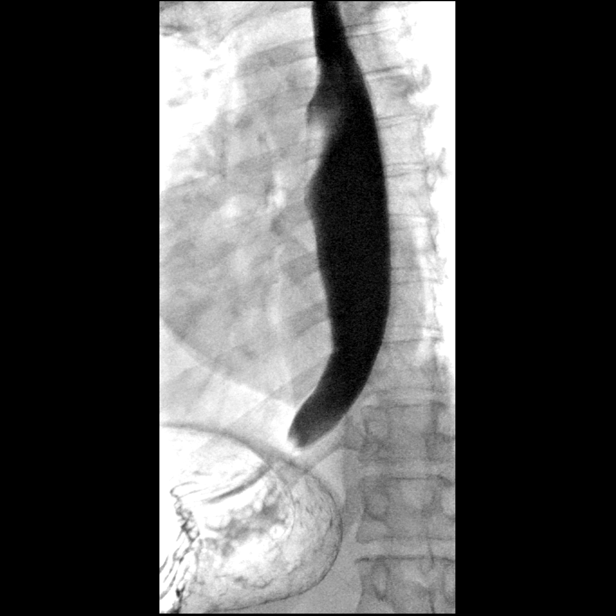
[frame 81/95]
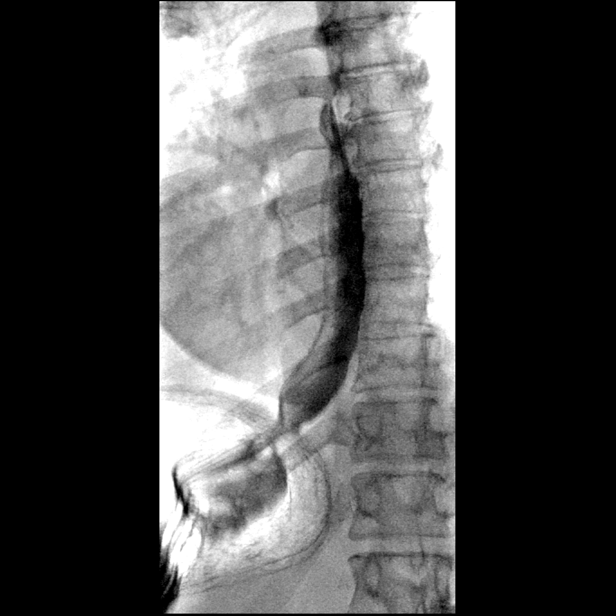

[Series 5: sequence · 2 of 20 frames shown (3 of 5)]
[frame 11/20]
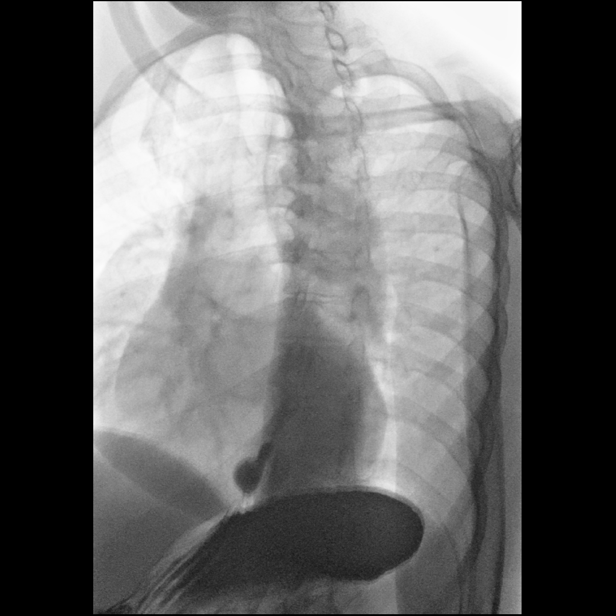
[frame 20/20]
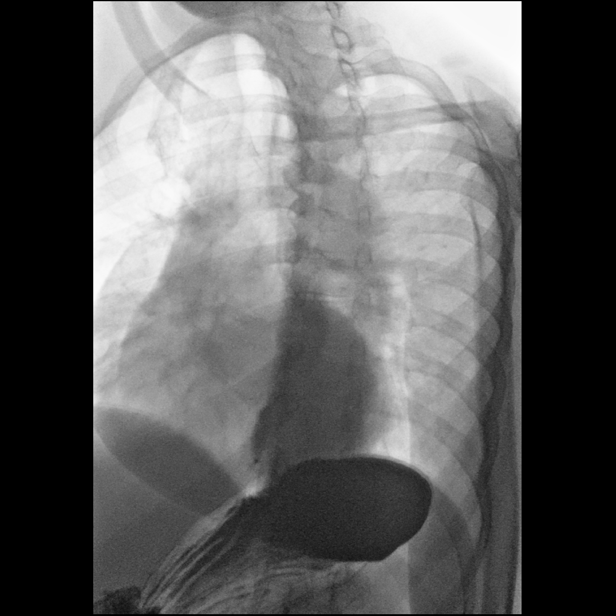

[Series 7: sequence · 1 of 32 frames shown (4 of 5)]
[frame 17/32]
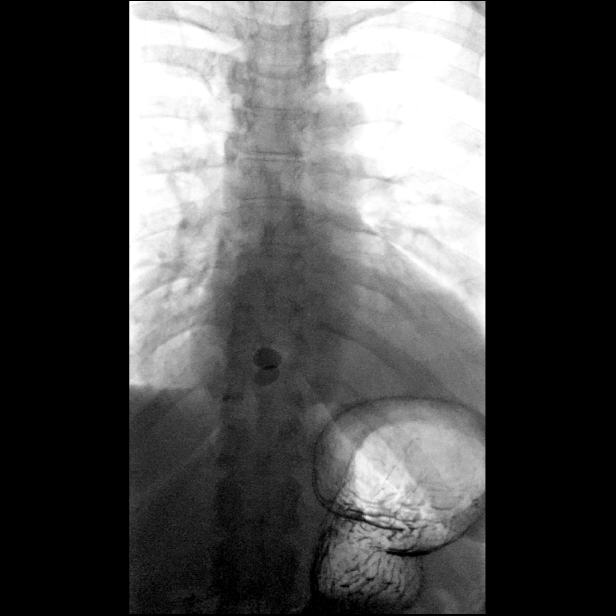

[Series 8: one shot · 3 of 8 slices shown (2 of 3)]
[im 1/8]
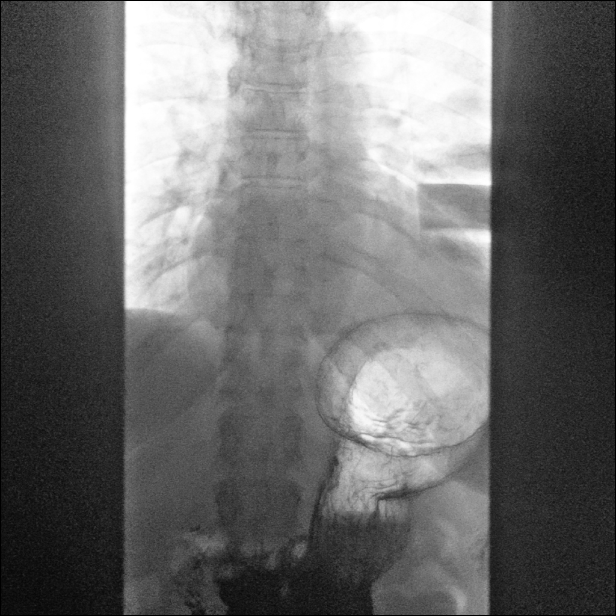
[im 5/8]
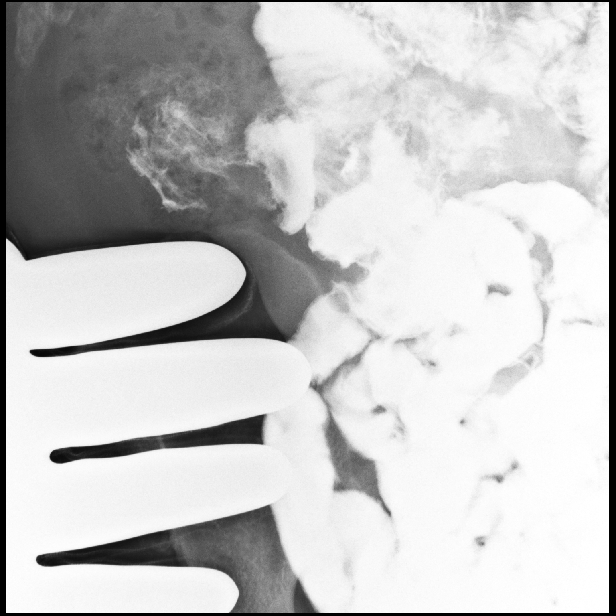
[im 6/8]
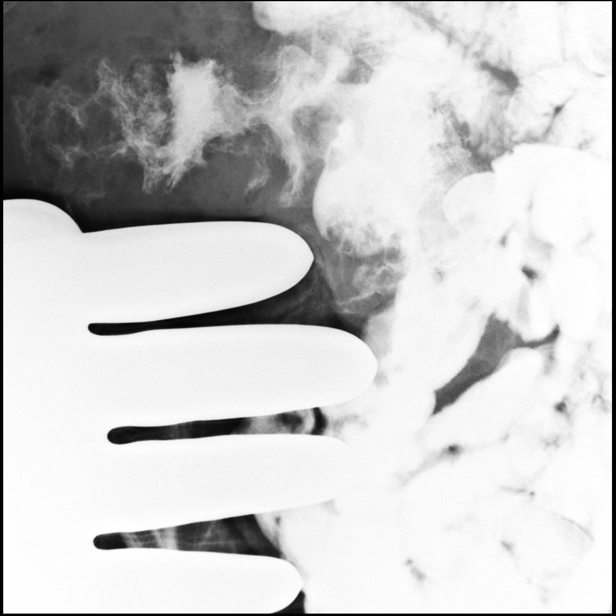

[Series 9: sequence · 1 of 21 frames shown (5 of 5)]
[frame 11/21]
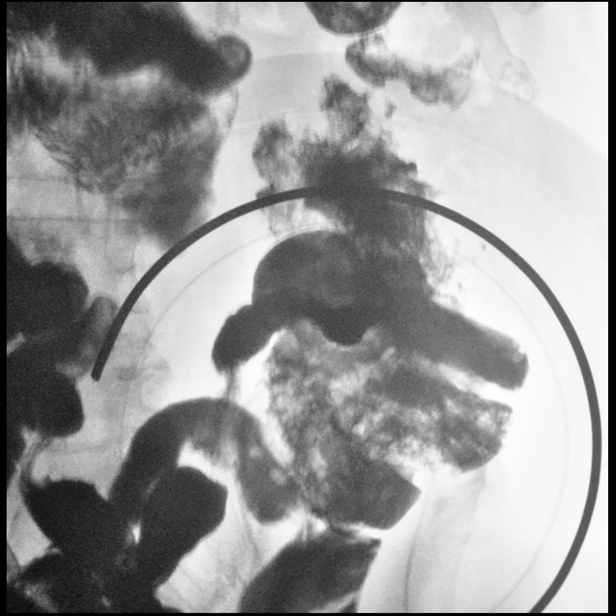

[Series 10: one shot · 0.16mm/px · 1 of 3 slices shown (3 of 3)]
[im 3/3]
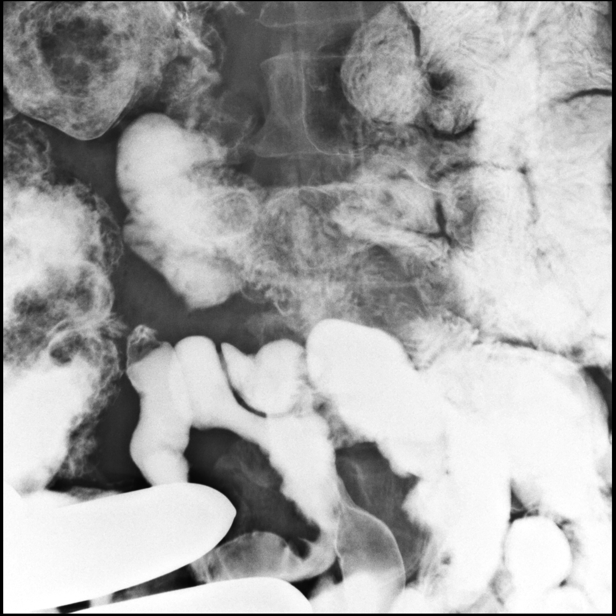

[14 of 24 positions shown; findings below may reference images not displayed]

FINDINGS: Scout radiograph demonstrates no dilated small bowel loops. Moderate
colonic stool volume. No evidence of pneumatosis or
pneumoperitoneum. Cholecystectomy clips in the right upper quadrant.
Clear lung bases.

Normal esophageal motility. No hiatal hernia. Mild gastroesophageal
reflux elicited to the level of the midthoracic esophagus with water
siphon test. Normal esophageal mucosa with no evidence of reflux
esophagitis. Normal esophageal distensibility, with no evidence of
esophageal mass, stricture or ulcer. Barium tablet traversed the
esophagus into the stomach without delay.

Normal gastric emptying. No gastric fold thickening. No filling
defects, ulcers or significant erosions in the stomach. Normal
duodenal bulb and C sweep with no evidence of duodenal fold
thickening, filling defects, strictures or ulcers. Normal duodenal
jejunal junction to the left of the spine.

Normal small bowel transit time (1 hour 10 minutes). Normal caliber
small bowel loops with no small bowel fold thickening, filling
defects, kinking, fixed positioning or fistula. Normal terminal
ileum.
IMPRESSION: 1. Mild gastroesophageal reflux elicited.  No hiatal hernia.
2. Otherwise normal upper GI and small-bowel follow-through.
3. Moderate colonic stool volume suggests constipation.

## 2019-08-08 ENCOUNTER — Encounter: Payer: Self-pay | Admitting: Neurology

## 2019-08-08 ENCOUNTER — Ambulatory Visit (INDEPENDENT_AMBULATORY_CARE_PROVIDER_SITE_OTHER): Admitting: Neurology

## 2019-08-08 VITALS — BP 118/64 | HR 79 | Ht 63.0 in | Wt 171.0 lb

## 2019-08-08 DIAGNOSIS — G40209 Localization-related (focal) (partial) symptomatic epilepsy and epileptic syndromes with complex partial seizures, not intractable, without status epilepticus: Secondary | ICD-10-CM | POA: Diagnosis not present

## 2019-08-08 DIAGNOSIS — I671 Cerebral aneurysm, nonruptured: Secondary | ICD-10-CM | POA: Diagnosis not present
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
## 2019-08-08 NOTE — Patient Instructions (Addendum)
Check Lamictal level  Continue current medications See your primary doctor for better control of your anxiety  See you back in 6 months

## 2019-08-09 LAB — LAMOTRIGINE LEVEL: Lamotrigine Lvl: 21.1 ug/mL (ref 2.0–20.0)

## 2019-08-10 ENCOUNTER — Encounter: Payer: Self-pay | Admitting: Neurology

## 2019-08-10 DIAGNOSIS — G40209 Localization-related (focal) (partial) symptomatic epilepsy and epileptic syndromes with complex partial seizures, not intractable, without status epilepticus: Secondary | ICD-10-CM

## 2019-08-10 NOTE — Telephone Encounter (Signed)
I spoke with pt and she will be tomorrow for lab draw at 0800, she needed transportation.  She was aware of results.

## 2019-08-10 NOTE — Addendum Note (Signed)
Addended by: Brandon Melnick on: 08/10/2019 11:57 AM   Modules accepted: Orders

## 2019-08-11 ENCOUNTER — Other Ambulatory Visit (INDEPENDENT_AMBULATORY_CARE_PROVIDER_SITE_OTHER): Payer: Self-pay

## 2019-08-11 ENCOUNTER — Other Ambulatory Visit: Payer: Self-pay

## 2019-08-11 DIAGNOSIS — G40209 Localization-related (focal) (partial) symptomatic epilepsy and epileptic syndromes with complex partial seizures, not intractable, without status epilepticus: Secondary | ICD-10-CM

## 2019-08-11 DIAGNOSIS — Z0289 Encounter for other administrative examinations: Secondary | ICD-10-CM

## 2019-08-12 LAB — LAMOTRIGINE LEVEL: Lamotrigine Lvl: 18.8 ug/mL (ref 2.0–20.0)

## 2019-08-30 ENCOUNTER — Telehealth: Payer: Self-pay | Admitting: Student

## 2019-08-30 NOTE — Telephone Encounter (Signed)
NIR.  Received fax prescription refill request from pharmacy for Plavix. Faxed filled prescription to [facility] [phone]) at 1224- Plavix 75 mg tablets, take one tablet by mouth once daily, dispense 90 tablets with 1 refill.   Bea Graff Bana Borgmeyer, PA-C 08/30/2019, 12:43 PM

## 2019-09-19 ENCOUNTER — Encounter: Payer: Self-pay | Admitting: Neurology

## 2019-09-20 ENCOUNTER — Other Ambulatory Visit: Payer: Self-pay | Admitting: Neurology

## 2019-09-20 MED ORDER — CLOBAZAM 10 MG PO TABS: 10.0000 mg | ORAL_TABLET | Freq: Every day | ORAL | 4 refills | Status: DC

## 2019-10-10 ENCOUNTER — Other Ambulatory Visit (HOSPITAL_COMMUNITY): Payer: Self-pay | Admitting: Radiology

## 2019-10-10 ENCOUNTER — Telehealth (HOSPITAL_COMMUNITY): Payer: Self-pay

## 2019-10-10 ENCOUNTER — Other Ambulatory Visit: Payer: Self-pay | Admitting: Radiology

## 2019-10-10 MED ORDER — CLOPIDOGREL BISULFATE 75 MG PO TABS: 75.0000 mg | ORAL_TABLET | Freq: Every day | ORAL | 6 refills | Status: DC

## 2019-10-10 NOTE — Telephone Encounter (Signed)
Pt called this morning needing a refill of her blood thinner. I have sent a message to out PA to call this in. AW

## 2019-11-30 ENCOUNTER — Other Ambulatory Visit (HOSPITAL_COMMUNITY): Payer: Self-pay | Admitting: Interventional Radiology

## 2019-11-30 DIAGNOSIS — I671 Cerebral aneurysm, nonruptured: Secondary | ICD-10-CM

## 2019-12-05 ENCOUNTER — Encounter (HOSPITAL_COMMUNITY): Payer: Self-pay

## 2019-12-05 ENCOUNTER — Ambulatory Visit (HOSPITAL_COMMUNITY): Admission: RE | Admit: 2019-12-05 | Source: Ambulatory Visit

## 2019-12-16 ENCOUNTER — Other Ambulatory Visit: Payer: Self-pay

## 2019-12-16 ENCOUNTER — Ambulatory Visit (HOSPITAL_COMMUNITY)
Admission: RE | Admit: 2019-12-16 | Discharge: 2019-12-16 | Disposition: A | Discharge status: Home / Self Care | Source: Ambulatory Visit | Attending: Interventional Radiology | Admitting: Interventional Radiology

## 2019-12-16 ENCOUNTER — Encounter (HOSPITAL_COMMUNITY): Payer: Self-pay

## 2019-12-16 DIAGNOSIS — I671 Cerebral aneurysm, nonruptured: Secondary | ICD-10-CM

## 2019-12-16 IMAGING — MR MR HEAD W/O CM
9 of 11 series · 32 of 48 positions shown · non-contrast
Comparison: CT angiogram [DATE]. Digital subtraction
angiography [DATE].

CLINICAL DATA: Follow-up post aneurysm repair.

EXAM:
MRI HEAD WITHOUT CONTRAST
MRA HEAD WITHOUT CONTRAST
TECHNIQUE: Multiplanar, multiecho pulse sequences of the brain and surrounding
structures were obtained without intravenous contrast. Angiographic
images of the head were obtained using MRA technique without
contrast.

[Series 4: DWI · axial · 3.0mm · 1.09mm/px · z∈[-78,+50]mm · 6 of 90 slices shown (1 of 4)]
[im 1/90]
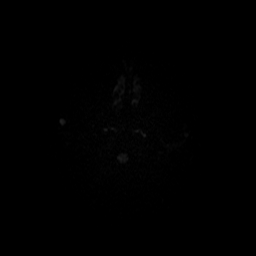
[im 18/90]
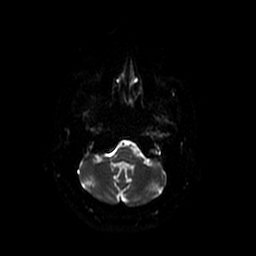
[im 36/90]
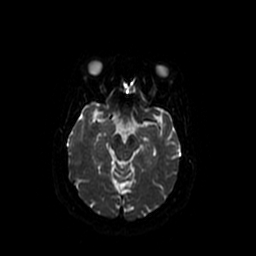
[im 54/90]
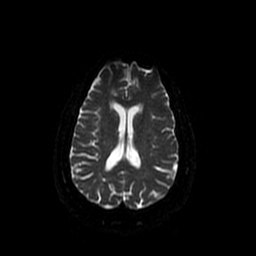
[im 72/90]
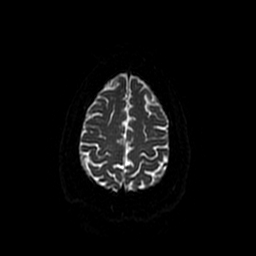
[im 90/90]
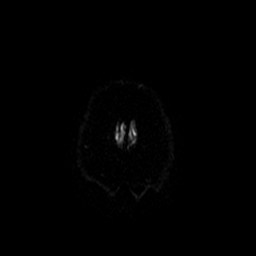

[Series 6: DWI · coronal · 5.0mm · 1.09mm/px · 6 of 70 slices shown (2 of 4)]
[im 1/70]
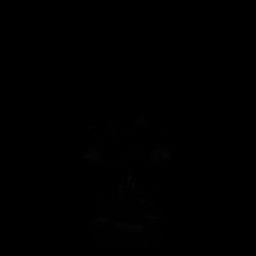
[im 14/70]
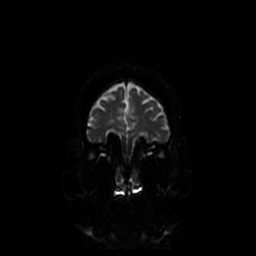
[im 28/70]
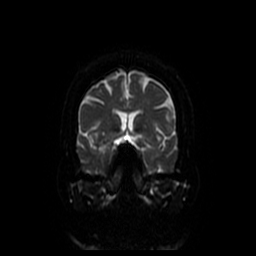
[im 42/70]
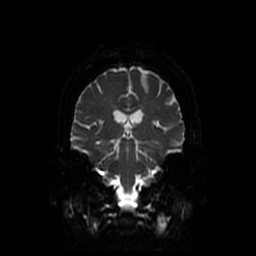
[im 56/70]
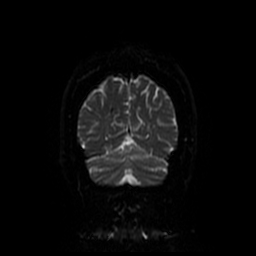
[im 70/70]
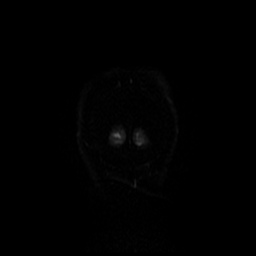

[Series 7: T1 · sagittal · 5.0mm · 0.47mm/px · 2 of 23 slices shown (1 of 2)]
[im 1/23]
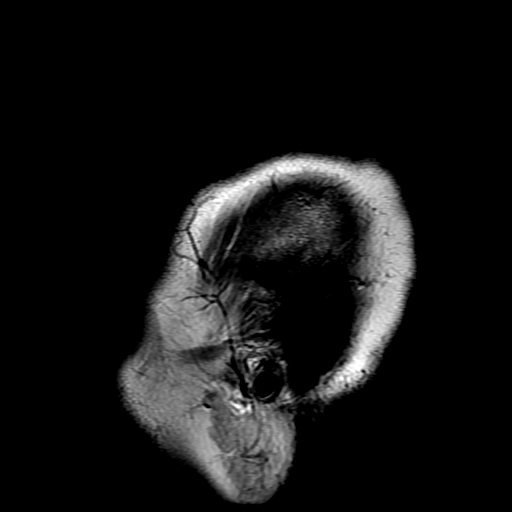
[im 23/23]
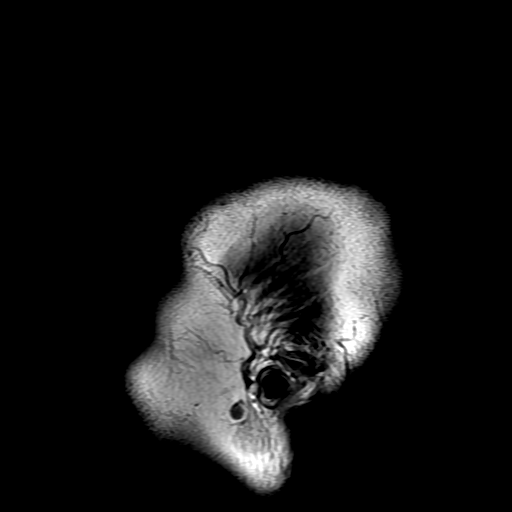

[Series 8: T2 · axial · 5.0mm · 0.43mm/px · z∈[-66,+69]mm · 2 of 24 slices shown (1 of 2)]
[im 1/24]
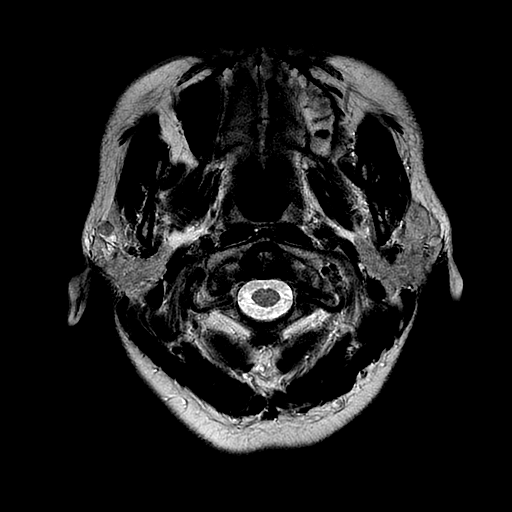
[im 24/24]
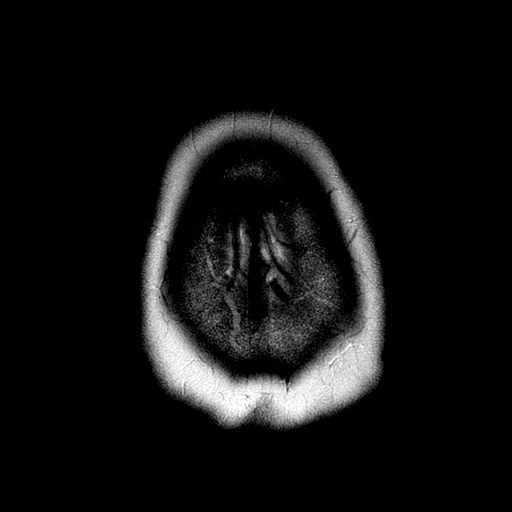

[Series 9: FLAIR · axial · 3.0mm · 0.43mm/px · z∈[-65,+71]mm · 2 of 24 slices shown]
[im 1/24]
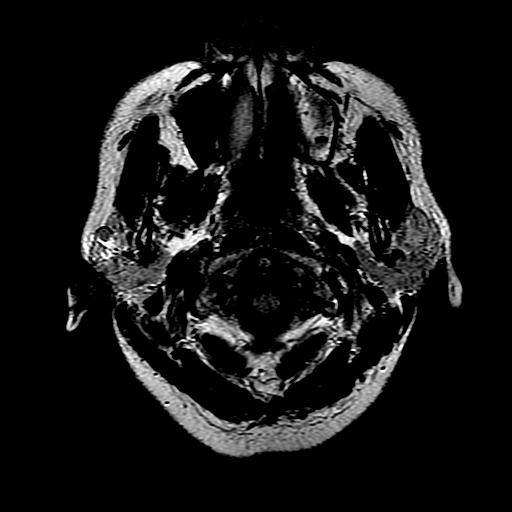
[im 24/24]
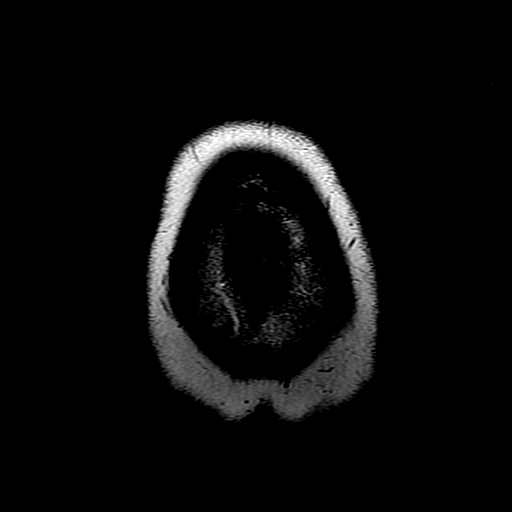

[Series 11: T1 · axial · 3.0mm · 0.47mm/px · z∈[-58,+25]mm · 5 of 100 slices shown (2 of 2)]
[im 1/100]
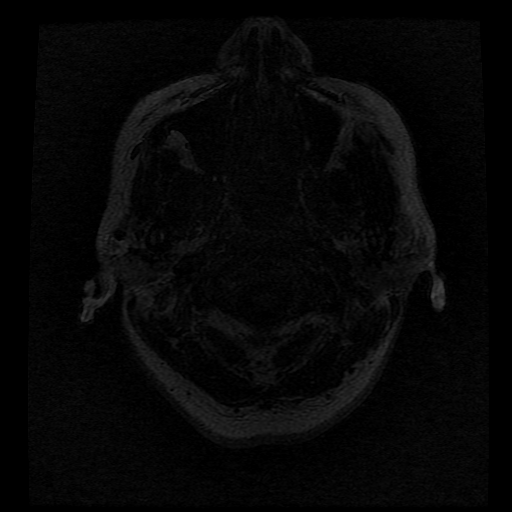
[im 15/100]
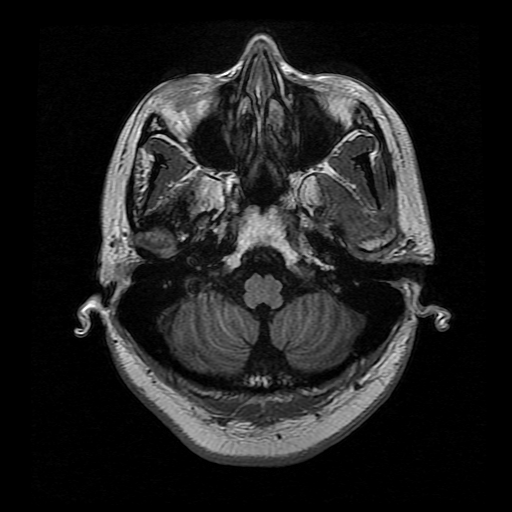
[im 29/100]
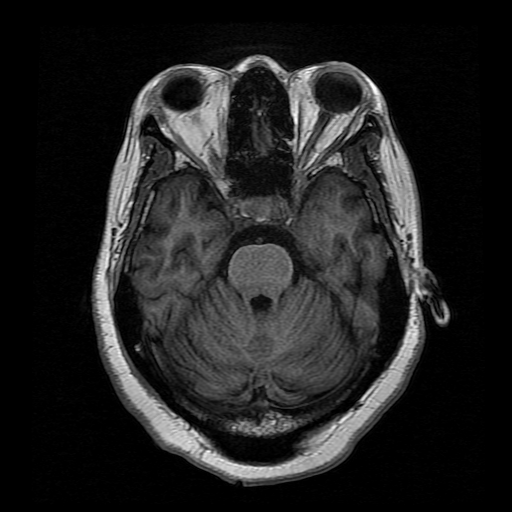
[im 43/100]
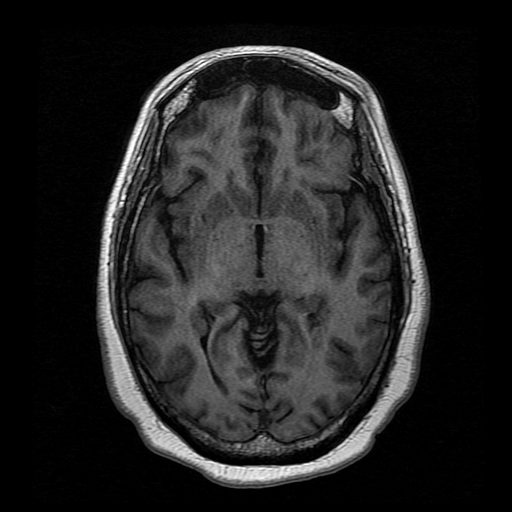
[im 57/100]
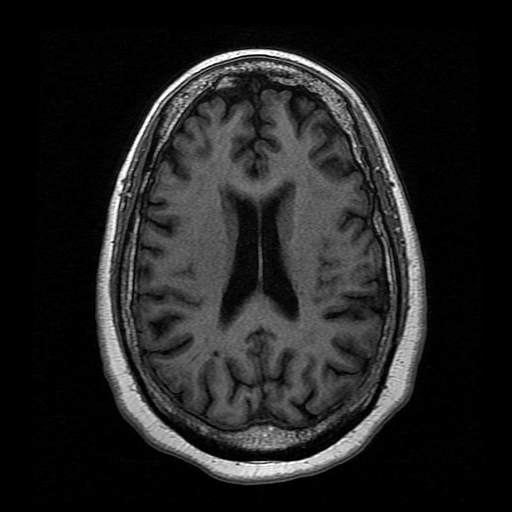

[Series 12: T2 · coronal · 5.0mm · 0.39mm/px · 2 of 27 slices shown (2 of 2)]
[im 1/27]
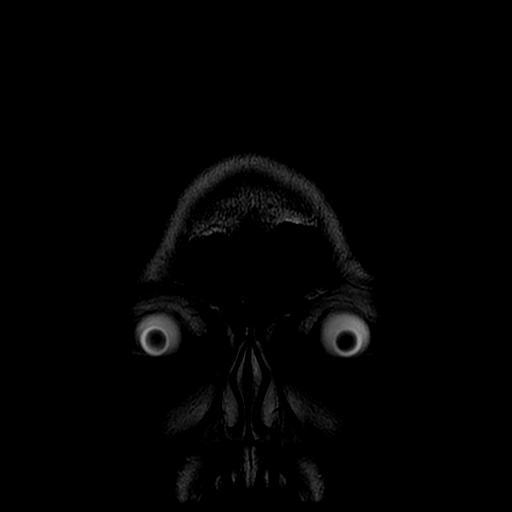
[im 27/27]
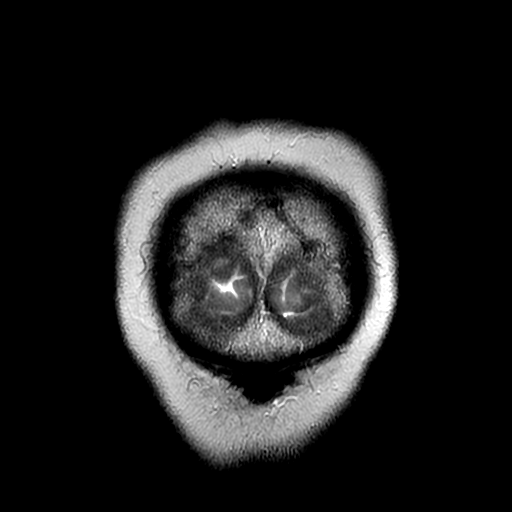

[Series 400: DWI · axial · 3.0mm · 1.09mm/px · z∈[-78,+50]mm · 4 of 45 slices shown (3 of 4)]
[im 1/45]
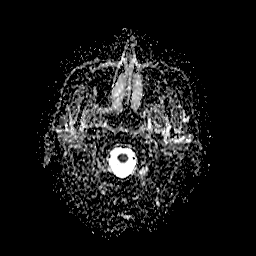
[im 15/45]
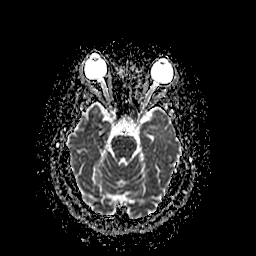
[im 30/45]
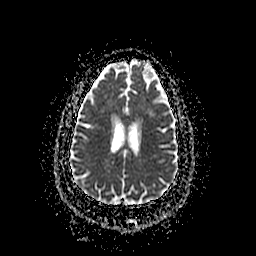
[im 45/45]
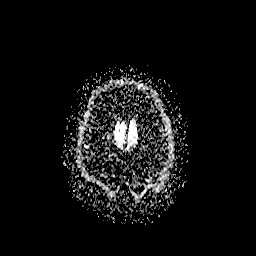

[Series 600: DWI · coronal · 5.0mm · 1.09mm/px · 3 of 35 slices shown (4 of 4)]
[im 1/35]
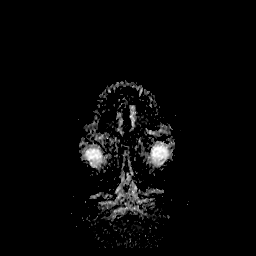
[im 18/35]
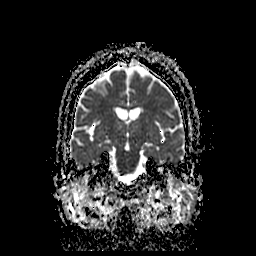
[im 35/35]
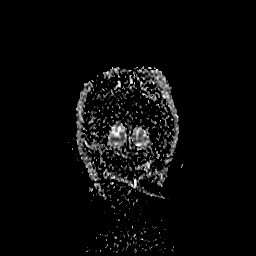

[32 of 48 positions shown; findings below may reference images not displayed]

FINDINGS: MRI HEAD FINDINGS

Brain: No acute infarction, hemorrhage, hydrocephalus, extra-axial
collection or mass lesion. Remote infarct in the bilateral corona
radiata. Scattered foci of T2 hyperintensity are seen within the
white matter of the cerebral hemispheres, nonspecific, unchanged
from prior MRI. Foci of gray matter heterotopia in the atrium of the
bilateral lateral ventricles.

Vascular: Normal flow voids.

Skull and upper cervical spine: Normal marrow signal.

Sinuses/Orbits: Negative.

MRA HEAD FINDINGS

Status post placement of a flow diverter in the intracranial left
ICA for treatment of a posterior communicating artery aneurysm.
Residual aneurysm sac is noted measuring 4.6 x 4.2 cm. The flow
diverter is patent.

The intracranial right ICA, bilateral ACAs and PCAs have normal flow
related enhancement without evidence of occlusion, stenosis or
aneurysm.

Normal flow related enhancement is seen in the intracranial
vertebral arteries, basilar arteries and posterior cerebral arteries
noting diminutive bilateral P1/PCA segments with prominent bilateral
posterior communicating arteries. No evidence of stenosis,
occlusion, aneurysm or vascular malformation of the posterior
circulation.
IMPRESSION: 1. Status post placement of a flow diverter in the intracranial left
ICA for treatment of a posterior communicating artery aneurysm.
Residual aneurysm sac is noted measuring 4.6 x 4.2 cm.
2. Scattered foci of T2 hyperintensity within the white matter of
the cerebral hemispheres, nonspecific, but may reflect chronic small
vessel ischemic changes.
3. Foci of gray matter heterotopia in the atrium of the bilateral
lateral ventricles.

## 2019-12-19 ENCOUNTER — Other Ambulatory Visit (HOSPITAL_COMMUNITY): Payer: Self-pay | Admitting: Interventional Radiology

## 2019-12-19 DIAGNOSIS — I671 Cerebral aneurysm, nonruptured: Secondary | ICD-10-CM

## 2019-12-28 ENCOUNTER — Other Ambulatory Visit: Payer: Self-pay

## 2019-12-28 ENCOUNTER — Ambulatory Visit (HOSPITAL_COMMUNITY)
Admission: RE | Admit: 2019-12-28 | Discharge: 2019-12-28 | Disposition: A | Discharge status: Home / Self Care | Source: Ambulatory Visit | Attending: Interventional Radiology | Admitting: Interventional Radiology

## 2019-12-28 DIAGNOSIS — I671 Cerebral aneurysm, nonruptured: Secondary | ICD-10-CM

## 2019-12-29 HISTORY — PX: IR RADIOLOGIST EVAL & MGMT: IMG5224

## 2020-02-09 ENCOUNTER — Encounter: Payer: Self-pay | Admitting: Neurology

## 2020-02-09 ENCOUNTER — Ambulatory Visit (INDEPENDENT_AMBULATORY_CARE_PROVIDER_SITE_OTHER): Admitting: Neurology

## 2020-02-09 ENCOUNTER — Other Ambulatory Visit: Payer: Self-pay

## 2020-02-09 VITALS — BP 106/63 | HR 74 | Ht 63.0 in | Wt 177.2 lb

## 2020-02-09 DIAGNOSIS — R251 Tremor, unspecified: Secondary | ICD-10-CM | POA: Insufficient documentation

## 2020-02-09 DIAGNOSIS — I671 Cerebral aneurysm, nonruptured: Secondary | ICD-10-CM | POA: Diagnosis not present

## 2020-02-09 DIAGNOSIS — G40209 Localization-related (focal) (partial) symptomatic epilepsy and epileptic syndromes with complex partial seizures, not intractable, without status epilepticus: Secondary | ICD-10-CM | POA: Diagnosis not present

## 2020-02-09 MED ORDER — CLOBAZAM 10 MG PO TABS: 10.0000 mg | ORAL_TABLET | Freq: Every day | ORAL | 4 refills | Status: DC

## 2020-02-09 MED ORDER — LAMICTAL 200 MG PO TABS: ORAL_TABLET | ORAL | 3 refills | Status: DC
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
## 2020-02-10 LAB — TSH: TSH: 1.95 u[IU]/mL (ref 0.450–4.500)

## 2020-05-08 ENCOUNTER — Telehealth (HOSPITAL_COMMUNITY): Payer: Self-pay

## 2020-05-08 ENCOUNTER — Other Ambulatory Visit (HOSPITAL_COMMUNITY): Payer: Self-pay | Admitting: Interventional Radiology

## 2020-05-08 DIAGNOSIS — I671 Cerebral aneurysm, nonruptured: Secondary | ICD-10-CM

## 2020-05-08 NOTE — Telephone Encounter (Signed)
Called to schedule mri, no answer, left vm. AW 

## 2020-05-23 ENCOUNTER — Ambulatory Visit (HOSPITAL_COMMUNITY)
Admission: RE | Admit: 2020-05-23 | Discharge: 2020-05-23 | Disposition: A | Discharge status: Home / Self Care | Source: Ambulatory Visit | Attending: Interventional Radiology | Admitting: Interventional Radiology

## 2020-05-23 ENCOUNTER — Other Ambulatory Visit: Payer: Self-pay

## 2020-05-23 DIAGNOSIS — I671 Cerebral aneurysm, nonruptured: Secondary | ICD-10-CM | POA: Insufficient documentation

## 2020-05-23 IMAGING — MR MR HEAD W/O CM
9 of 11 series · 32 of 48 positions shown · non-contrast
Comparison: No pertinent prior exam.
COMPARISON: [DATE]

CLINICAL DATA: Epilepsy, treated aneurysm

EXAM:
MRI HEAD WITHOUT CONTRAST
MRA HEAD WITHOUT CONTRAST
TECHNIQUE: Multiplanar, multi-echo pulse sequences of the brain and surrounding
structures were acquired without intravenous contrast. Angiographic
images of the Circle of Willis were acquired using MRA technique
without intravenous contrast.

[Series 3: DWI · axial · 3.0mm · 1.09mm/px · z∈[-65,+88]mm · 7 of 104 slices shown (1 of 4)]
[im 1/104]
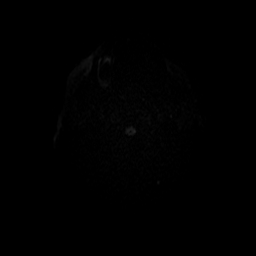
[im 18/104]
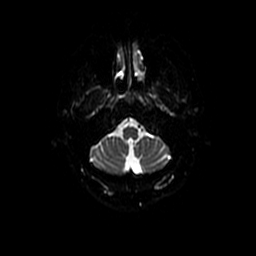
[im 35/104]
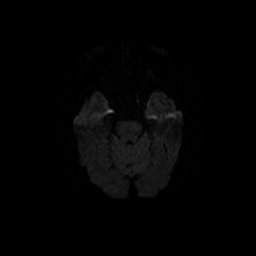
[im 52/104]
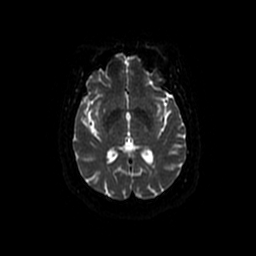
[im 69/104]
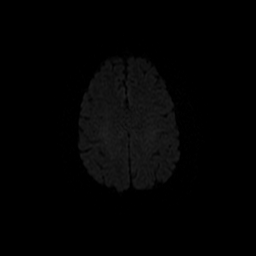
[im 86/104]
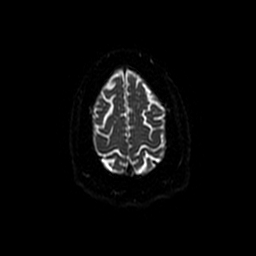
[im 104/104]
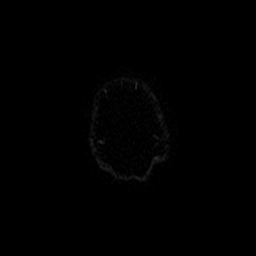

[Series 4: DWI · coronal · 5.0mm · 1.09mm/px · 6 of 72 slices shown (2 of 4)]
[im 1/72]
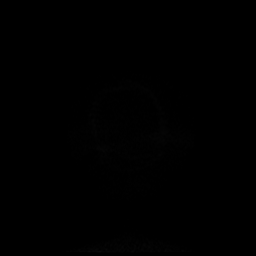
[im 15/72]
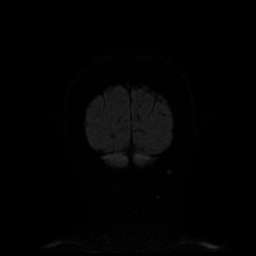
[im 29/72]
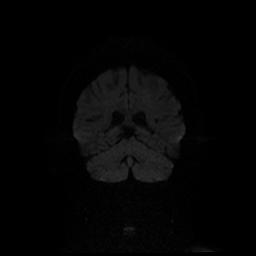
[im 43/72]
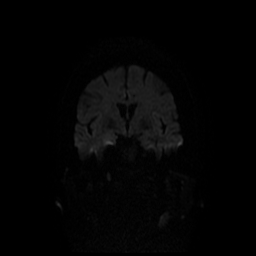
[im 57/72]
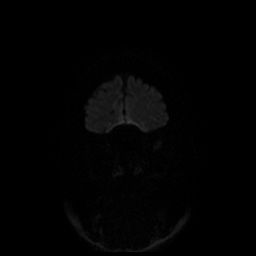
[im 72/72]
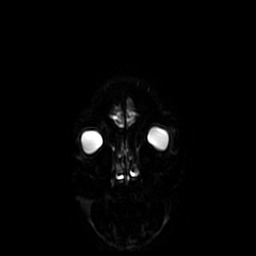

[Series 5: T1 · sagittal · 5.0mm · 0.49mm/px · 2 of 23 slices shown (1 of 2)]
[im 1/23]
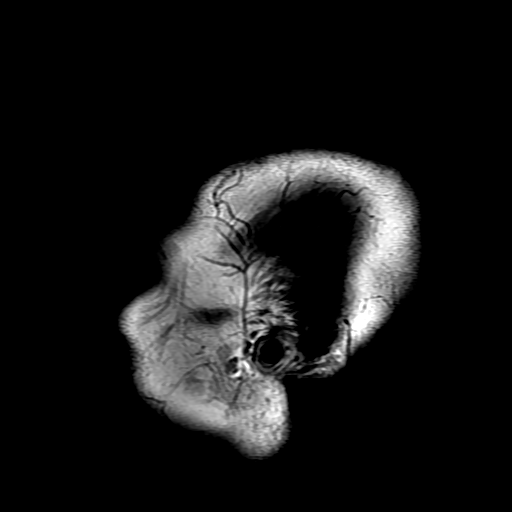
[im 23/23]
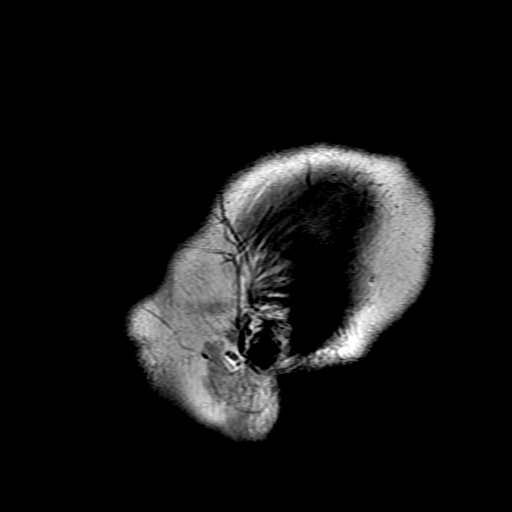

[Series 6: T2 · axial · 5.0mm · 0.43mm/px · z∈[-70,+80]mm · 2 of 26 slices shown]
[im 1/26]
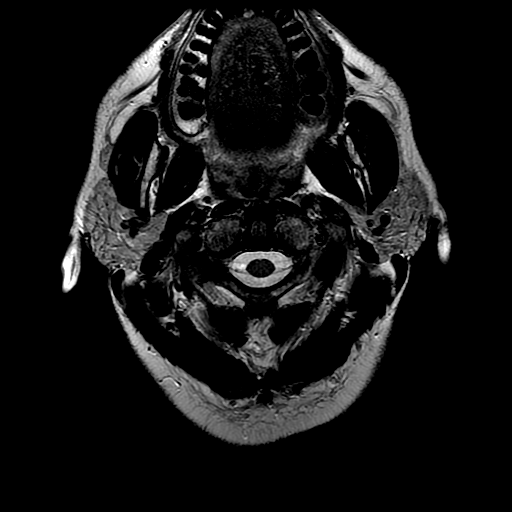
[im 26/26]
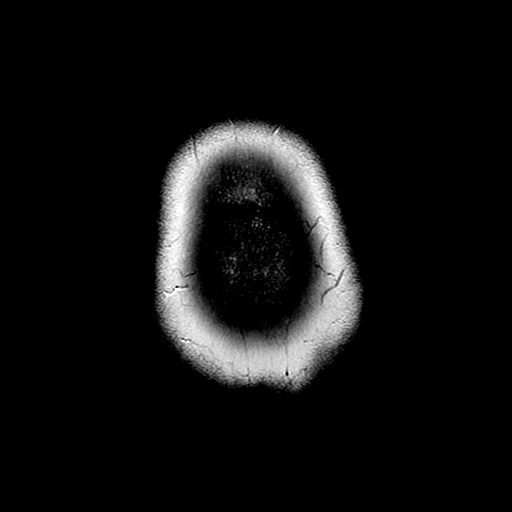

[Series 7: FLAIR · axial · 3.0mm · 0.43mm/px · z∈[-74,+88]mm · 2 of 28 slices shown]
[im 1/28]
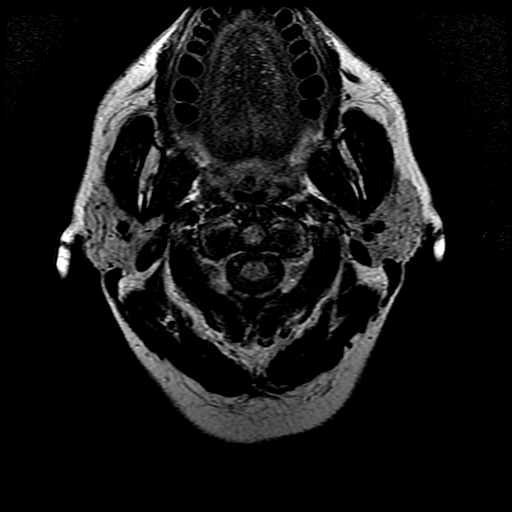
[im 28/28]
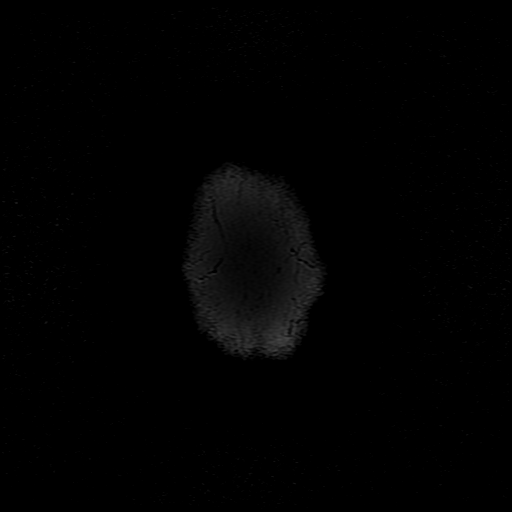

[Series 9: T1 · axial · 3.0mm · 0.47mm/px · z∈[-65,-2]mm · 4 of 100 slices shown (2 of 2)]
[im 1/100]
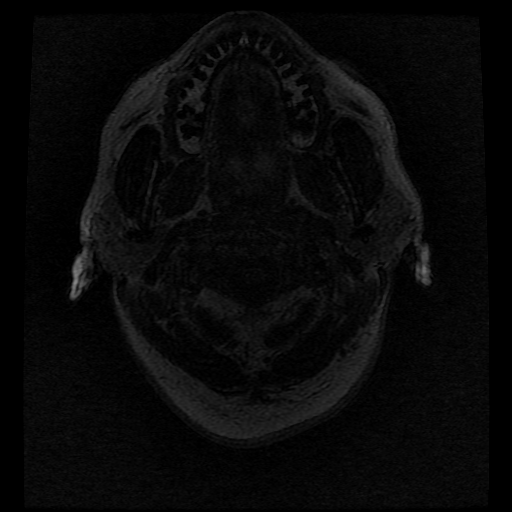
[im 15/100]
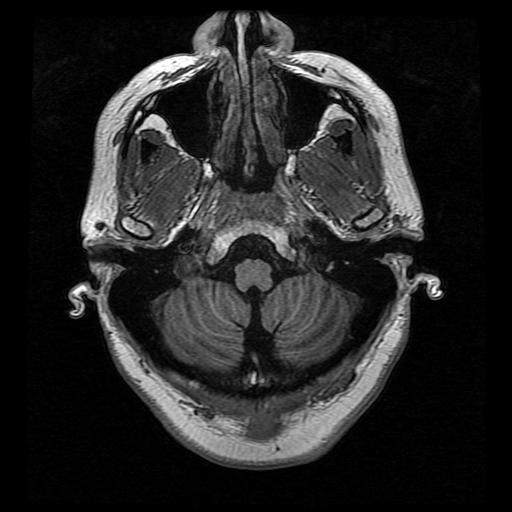
[im 29/100]
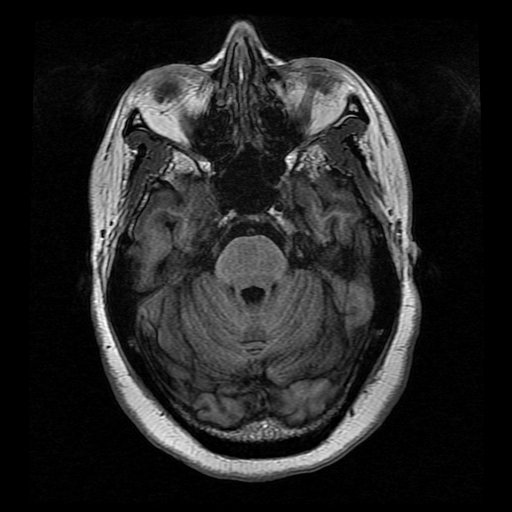
[im 43/100]
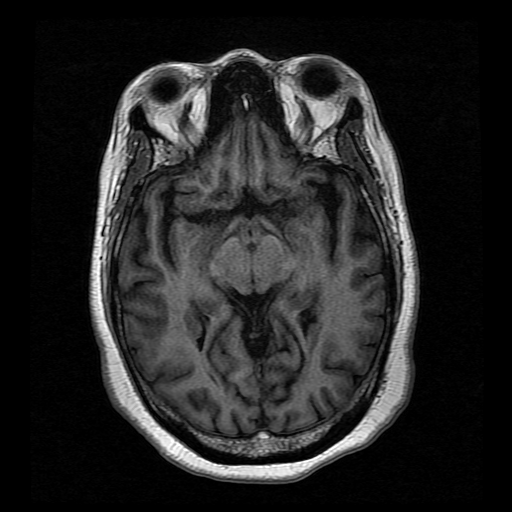

[Series 11: T2 post-contrast · coronal · 5.0mm · 0.39mm/px · 2 of 27 slices shown]
[im 1/27]
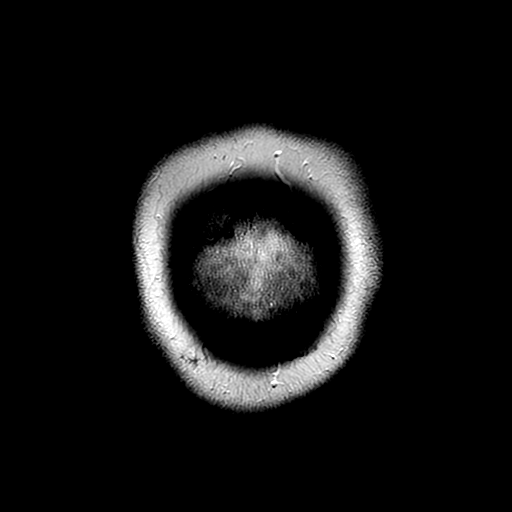
[im 27/27]
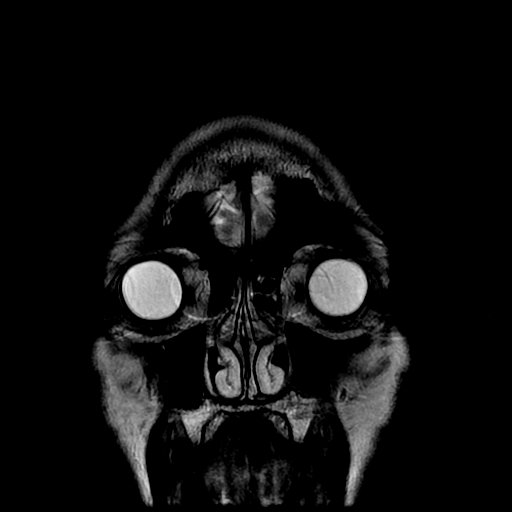

[Series 300: DWI · axial · 3.0mm · 1.09mm/px · z∈[-65,+88]mm · 4 of 52 slices shown (3 of 4)]
[im 1/52]
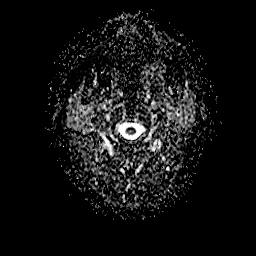
[im 18/52]
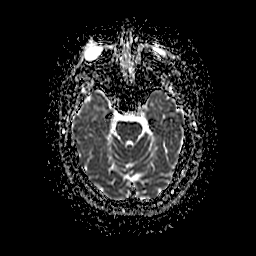
[im 35/52]
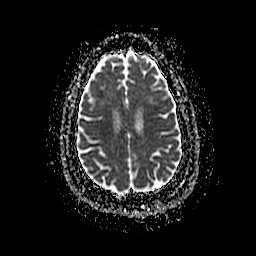
[im 52/52]
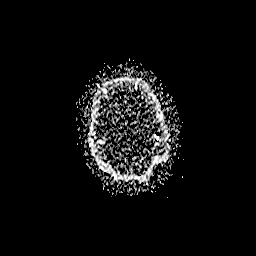

[Series 400: DWI · coronal · 5.0mm · 1.09mm/px · 3 of 36 slices shown (4 of 4)]
[im 1/36]
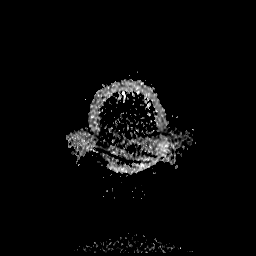
[im 18/36]
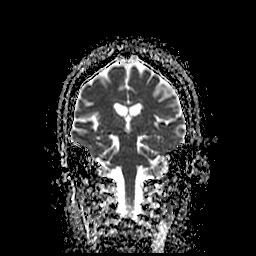
[im 36/36]
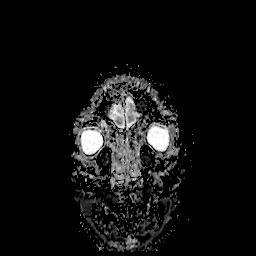

[32 of 48 positions shown; findings below may reference images not displayed]

FINDINGS: MRI HEAD

Brain: There is no acute infarction or intracranial hemorrhage.
There is no intracranial mass, mass effect, or edema. Small chronic
cerebral white matter infarcts are again identified. Additional
patchy T2 hyperintensity in the supratentorial white matter is
nonspecific but probably reflects stable chronic microvascular
ischemic changes. Foci of susceptibility hypointensity in the right
thalamus and parietal lobe reflect is chronic blood products.
Ventricles and sulci are stable and normal in size and
configuration. Foci of gray matter heterotopia are again identified
along the atria of the lateral ventricles. There is no hydrocephalus
or extra-axial fluid collection.

Vascular: Major vessel flow voids at the skull base are preserved.

Skull and upper cervical spine: Normal marrow signal is preserved.

Sinuses/Orbits: Paranasal sinuses are aerated. Orbits are
unremarkable.

Other: Sella is unremarkable.  Mastoid air cells are clear.

MRA HEAD

Intracranial internal carotid arteries are patent. There is
susceptibility along the supraclinoid left ICA reflecting presence
of flow diverting stent. There remains flow related enhancement
within the aneurysm at the origin of the left posterior
communicating artery. The residual aneurysm measures stable in size
allowing for differences in measurement technique, approximately
mm in maximum diameter coronally. Middle and anterior cerebral
arteries are patent. Intracranial vertebral arteries, basilar
artery, posterior cerebral arteries are patent. Bilateral posterior
communicating arteries are present. There is no significant
stenosis.
IMPRESSION: Stable residual left posterior communicating artery aneurysm
measuring 4.6 x 4.2 cm.

Stable appearance of the brain since [DATE].

## 2020-05-23 IMAGING — MR MR MRA HEAD W/O CM
1 series · 19 of 48 positions shown · non-contrast
Comparison: No pertinent prior exam.
COMPARISON: [DATE]

CLINICAL DATA: Epilepsy, treated aneurysm

EXAM:
MRI HEAD WITHOUT CONTRAST
MRA HEAD WITHOUT CONTRAST
TECHNIQUE: Multiplanar, multi-echo pulse sequences of the brain and surrounding
structures were acquired without intravenous contrast. Angiographic
images of the Circle of Willis were acquired using MRA technique
without intravenous contrast.

[Series 10: (id) mt fs · axial · 1.4mm · 0.43mm/px · z∈[-48,+42]mm · 19 of 136 slices shown]
[im 1/136]
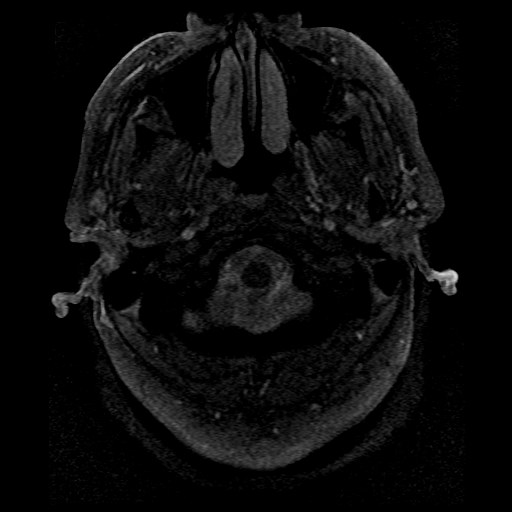
[im 3/136]
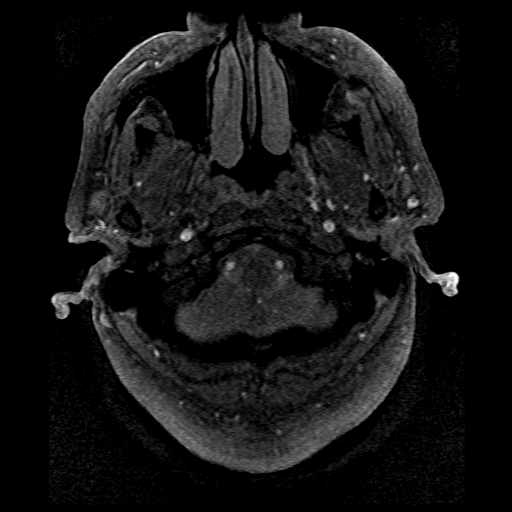
[im 6/136]
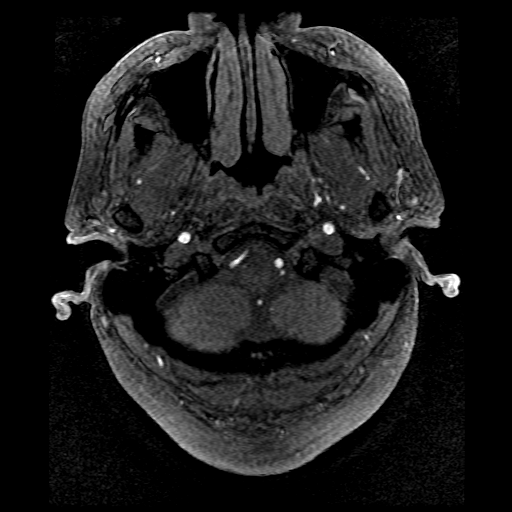
[im 9/136]
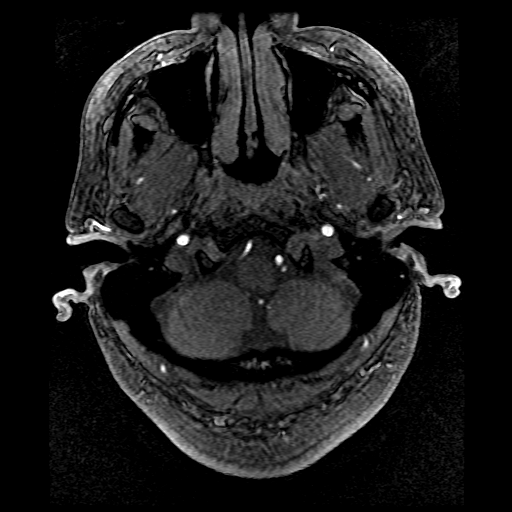
[im 12/136]
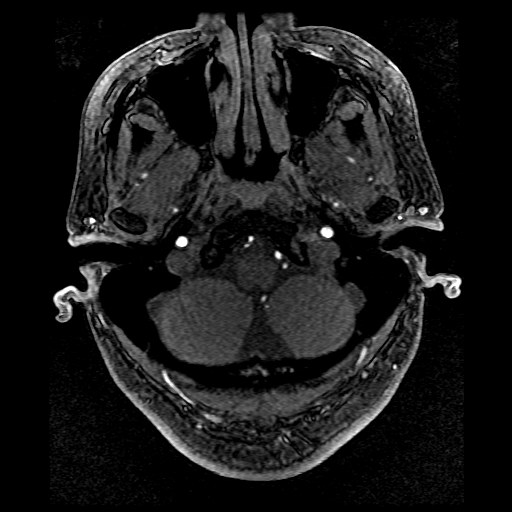
[im 15/136]
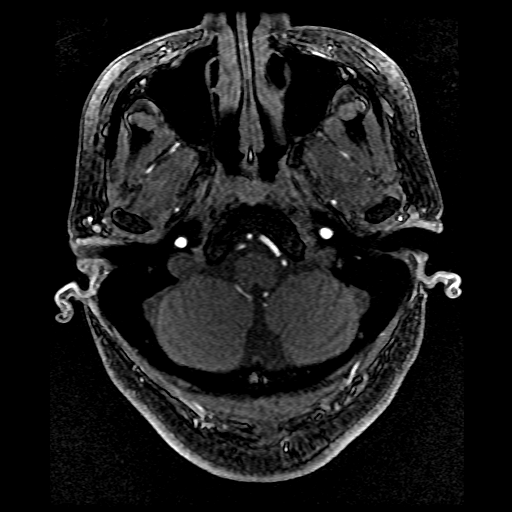
[im 18/136]
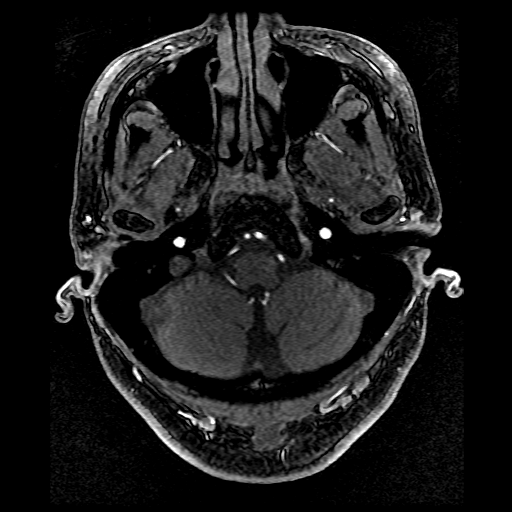
[im 21/136]
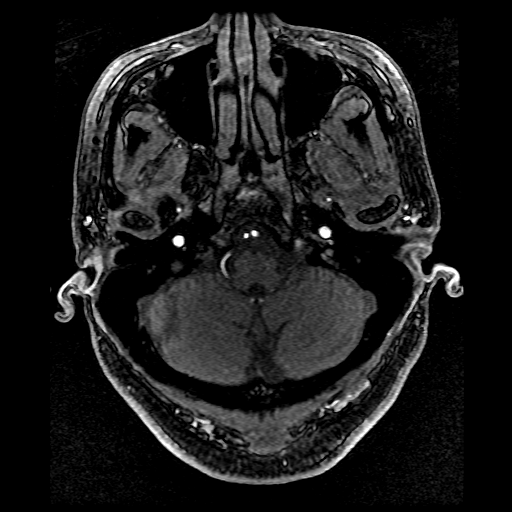
[im 23/136]
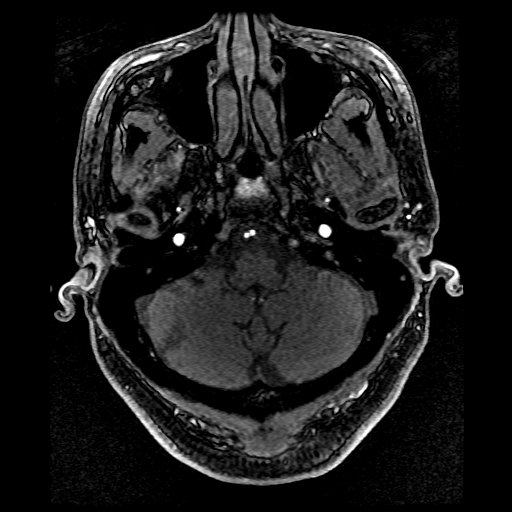
[im 26/136]
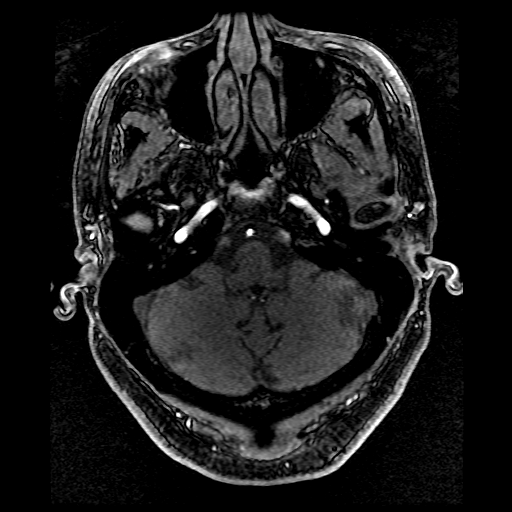
[im 29/136]
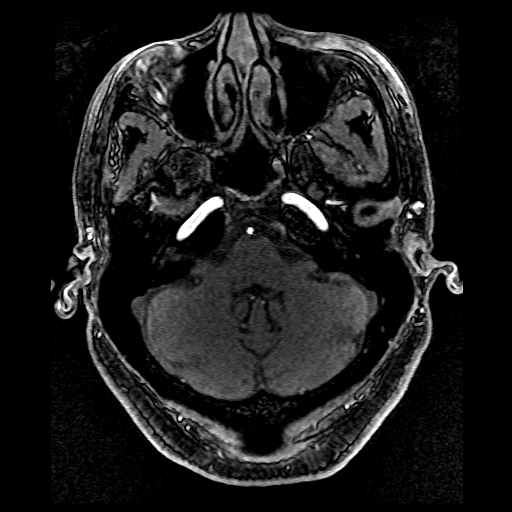
[im 44/136]
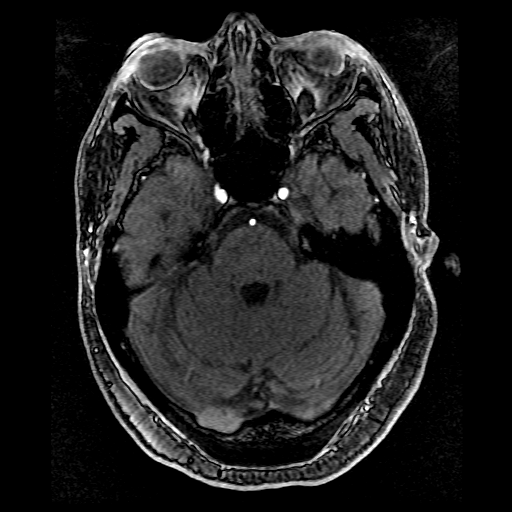
[im 61/136]
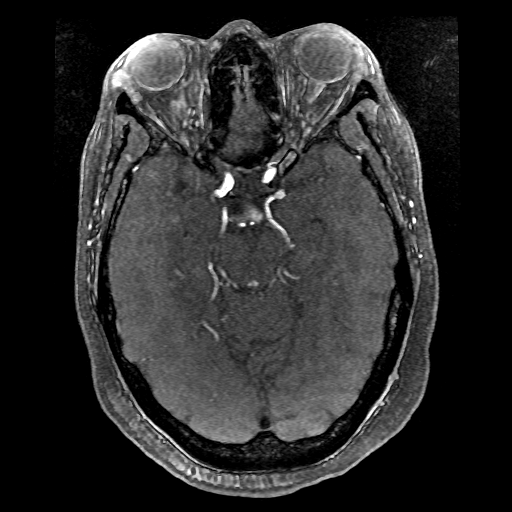
[im 69/136]
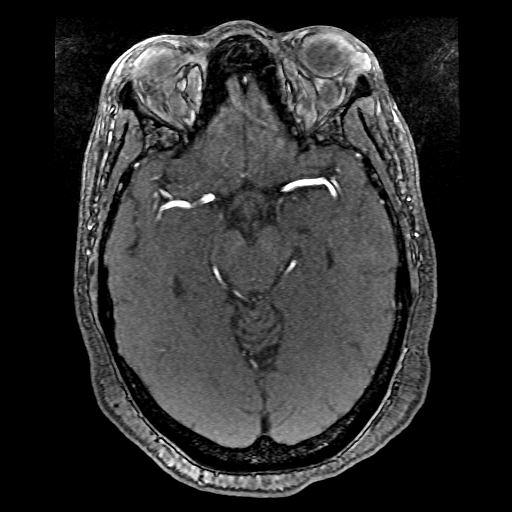
[im 78/136]
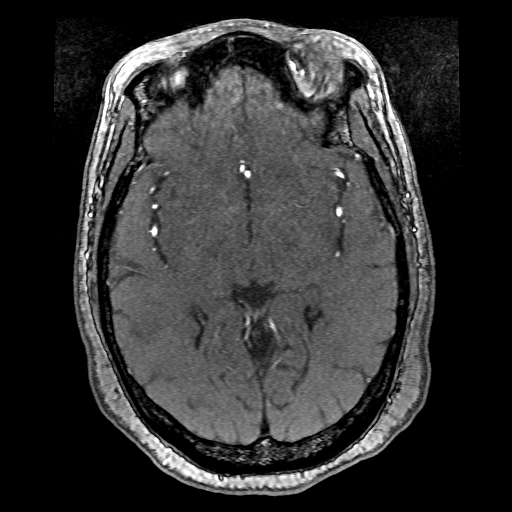
[im 95/136]
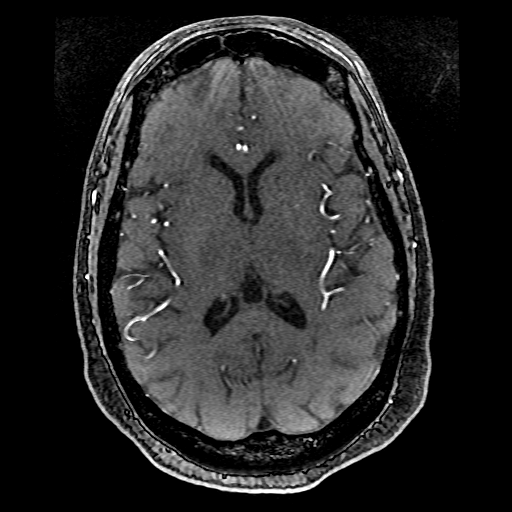
[im 113/136]
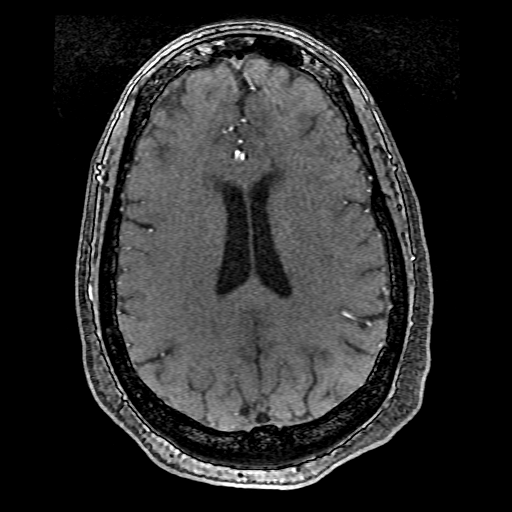
[im 115/136]
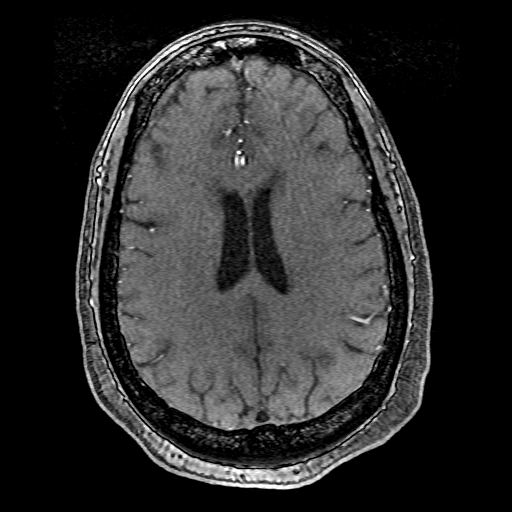
[im 130/136]
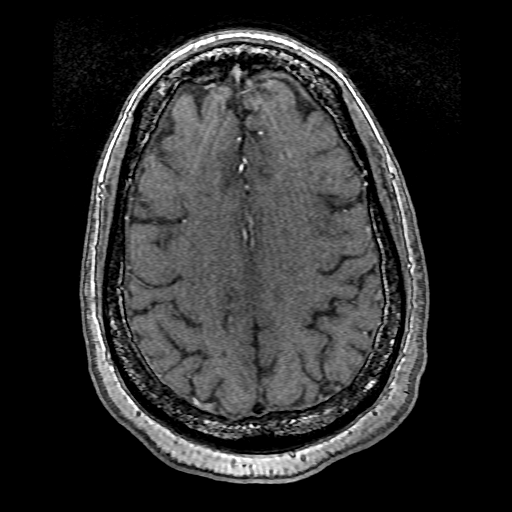

[19 of 48 positions shown; findings below may reference images not displayed]

FINDINGS: MRI HEAD

Brain: There is no acute infarction or intracranial hemorrhage.
There is no intracranial mass, mass effect, or edema. Small chronic
cerebral white matter infarcts are again identified. Additional
patchy T2 hyperintensity in the supratentorial white matter is
nonspecific but probably reflects stable chronic microvascular
ischemic changes. Foci of susceptibility hypointensity in the right
thalamus and parietal lobe reflect is chronic blood products.
Ventricles and sulci are stable and normal in size and
configuration. Foci of gray matter heterotopia are again identified
along the atria of the lateral ventricles. There is no hydrocephalus
or extra-axial fluid collection.

Vascular: Major vessel flow voids at the skull base are preserved.

Skull and upper cervical spine: Normal marrow signal is preserved.

Sinuses/Orbits: Paranasal sinuses are aerated. Orbits are
unremarkable.

Other: Sella is unremarkable.  Mastoid air cells are clear.

MRA HEAD

Intracranial internal carotid arteries are patent. There is
susceptibility along the supraclinoid left ICA reflecting presence
of flow diverting stent. There remains flow related enhancement
within the aneurysm at the origin of the left posterior
communicating artery. The residual aneurysm measures stable in size
allowing for differences in measurement technique, approximately
mm in maximum diameter coronally. Middle and anterior cerebral
arteries are patent. Intracranial vertebral arteries, basilar
artery, posterior cerebral arteries are patent. Bilateral posterior
communicating arteries are present. There is no significant
stenosis.
IMPRESSION: Stable residual left posterior communicating artery aneurysm
measuring 4.6 x 4.2 cm.

Stable appearance of the brain since [DATE].

## 2020-05-28 ENCOUNTER — Telehealth (HOSPITAL_COMMUNITY): Payer: Self-pay

## 2020-05-28 ENCOUNTER — Other Ambulatory Visit (HOSPITAL_COMMUNITY): Payer: Self-pay | Admitting: Interventional Radiology

## 2020-05-28 DIAGNOSIS — I671 Cerebral aneurysm, nonruptured: Secondary | ICD-10-CM

## 2020-05-28 NOTE — Telephone Encounter (Signed)
Called to schedule consult, no answer, left vm. AW  

## 2020-06-22 ENCOUNTER — Other Ambulatory Visit: Payer: Self-pay

## 2020-06-22 ENCOUNTER — Ambulatory Visit (HOSPITAL_COMMUNITY)
Admission: RE | Admit: 2020-06-22 | Discharge: 2020-06-22 | Disposition: A | Discharge status: Home / Self Care | Source: Ambulatory Visit | Attending: Interventional Radiology | Admitting: Interventional Radiology

## 2020-06-22 DIAGNOSIS — I671 Cerebral aneurysm, nonruptured: Secondary | ICD-10-CM

## 2020-06-29 ENCOUNTER — Other Ambulatory Visit: Payer: Self-pay

## 2020-06-29 ENCOUNTER — Ambulatory Visit (HOSPITAL_COMMUNITY)
Admission: RE | Admit: 2020-06-29 | Discharge: 2020-06-29 | Disposition: A | Discharge status: Home / Self Care | Source: Ambulatory Visit | Attending: Interventional Radiology | Admitting: Interventional Radiology

## 2020-07-02 HISTORY — PX: IR RADIOLOGIST EVAL & MGMT: IMG5224

## 2020-08-09 ENCOUNTER — Telehealth: Payer: Self-pay | Admitting: Neurology

## 2020-08-09 ENCOUNTER — Ambulatory Visit: Admitting: Neurology

## 2020-08-09 NOTE — Telephone Encounter (Signed)
I spoke to the patient. She has been experiencing intermittent headaches and just "not feeling well" for the last week. States she had an appt w/ Dr. Jacelyn Grip (PCP) and had lab work completed. She is following up with him further on this headache. She will let us know if he feels she needs further evaluation from neurology. She will have him send the referral.

## 2020-08-09 NOTE — Telephone Encounter (Signed)
Pt is asking if she can come in for labwork due to a very bad headache.  Pt states she feels out of sorts on her right side, please call.

## 2020-08-13 ENCOUNTER — Encounter (HOSPITAL_COMMUNITY): Payer: Self-pay

## 2020-08-13 ENCOUNTER — Emergency Department (HOSPITAL_COMMUNITY)

## 2020-08-13 ENCOUNTER — Emergency Department (HOSPITAL_COMMUNITY)
Admission: EM | Admit: 2020-08-13 | Discharge: 2020-08-13 | Disposition: A | Discharge status: Home / Self Care | Attending: Emergency Medicine | Admitting: Emergency Medicine

## 2020-08-13 ENCOUNTER — Other Ambulatory Visit: Payer: Self-pay

## 2020-08-13 DIAGNOSIS — R7303 Prediabetes: Secondary | ICD-10-CM | POA: Diagnosis not present

## 2020-08-13 DIAGNOSIS — Z853 Personal history of malignant neoplasm of breast: Secondary | ICD-10-CM | POA: Insufficient documentation

## 2020-08-13 DIAGNOSIS — Z79899 Other long term (current) drug therapy: Secondary | ICD-10-CM | POA: Insufficient documentation

## 2020-08-13 DIAGNOSIS — R519 Headache, unspecified: Secondary | ICD-10-CM | POA: Diagnosis not present

## 2020-08-13 DIAGNOSIS — I10 Essential (primary) hypertension: Secondary | ICD-10-CM | POA: Diagnosis not present

## 2020-08-13 DIAGNOSIS — Z7984 Long term (current) use of oral hypoglycemic drugs: Secondary | ICD-10-CM | POA: Diagnosis not present

## 2020-08-13 IMAGING — CT CT HEAD W/O CM
3 of 4 series · 15 of 47 positions shown, 18 images · non-contrast
Comparison: Head MRI [DATE]

CLINICAL DATA: Headache.

EXAM:
CT HEAD WITHOUT CONTRAST
TECHNIQUE: Contiguous axial images were obtained from the base of the skull
through the vertex without intravenous contrast.

[Series 3: head 2.0 h70h · axial · 0.46mm/px · z∈[-104,+30]mm · 9 of 85 slices shown, 12 images]
[im 9/85  brain]
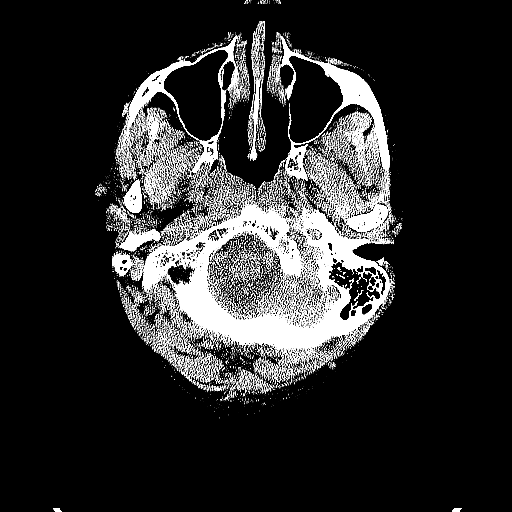
[im 9/85  bone]
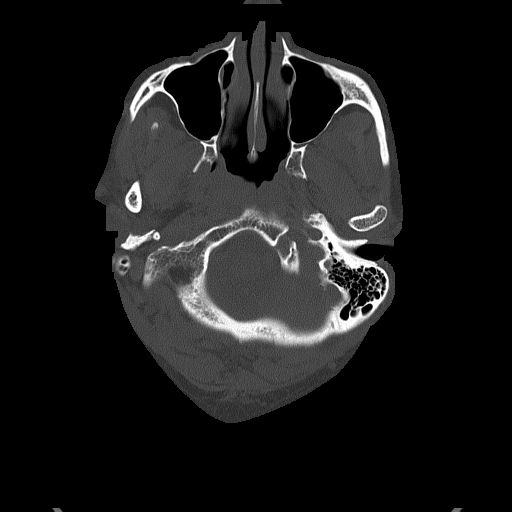
[im 17/85  brain]
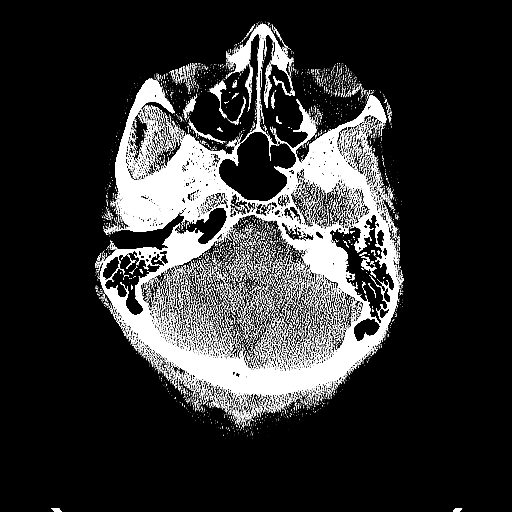
[im 26/85  brain]
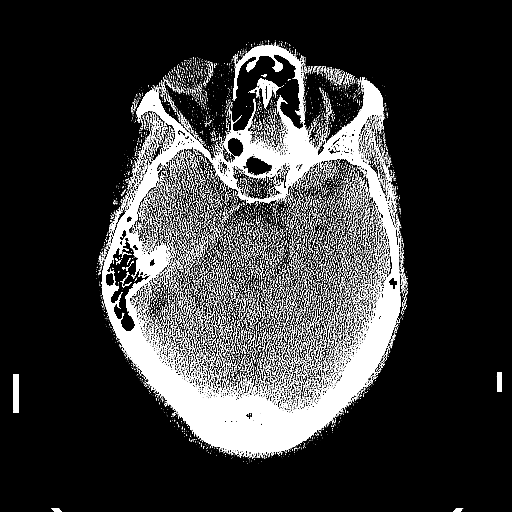
[im 34/85  brain]
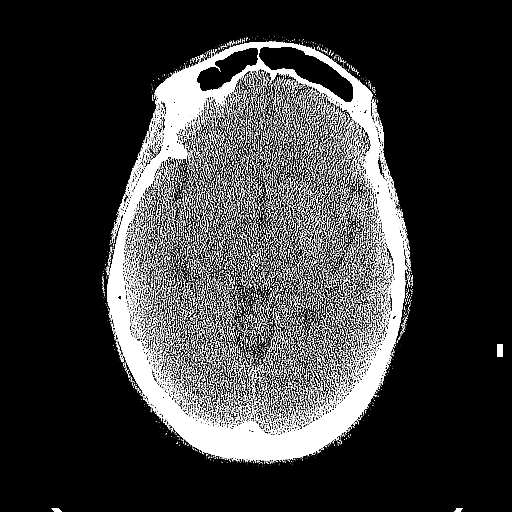
[im 43/85  brain]
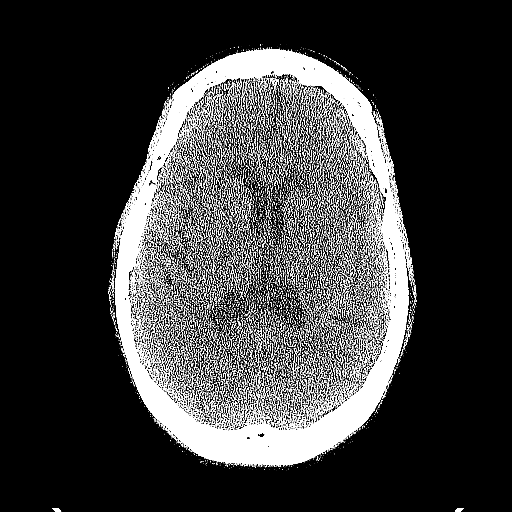
[im 43/85  bone]
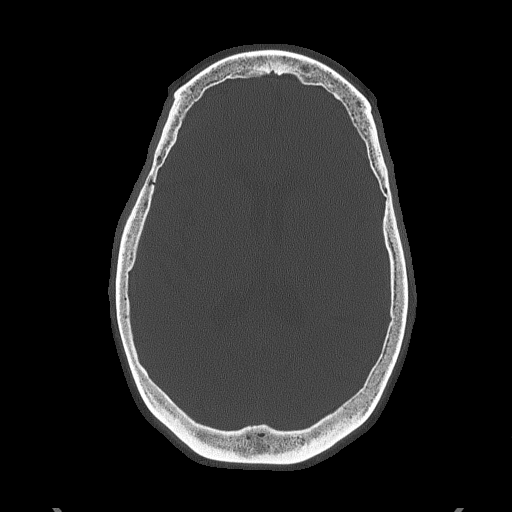
[im 51/85  brain]
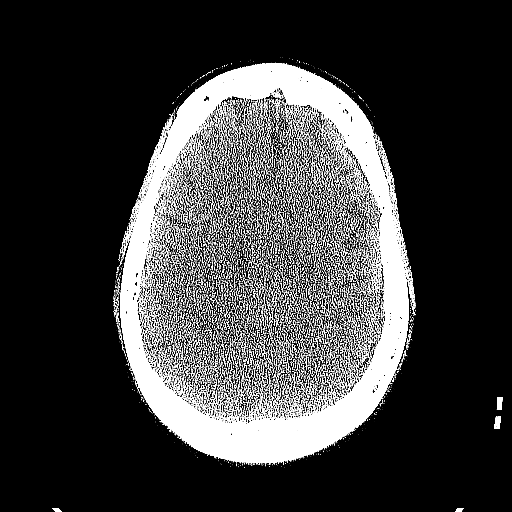
[im 59/85  brain]
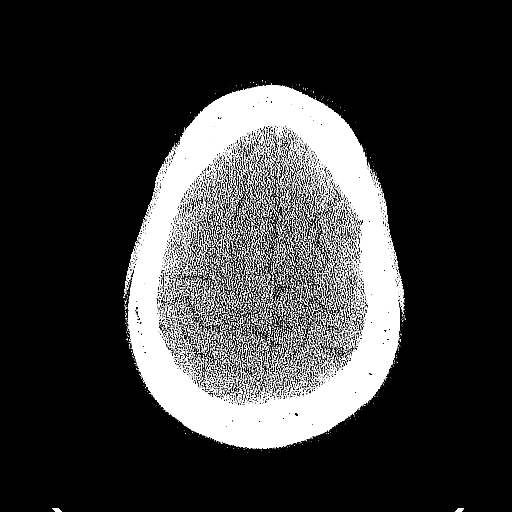
[im 68/85  brain]
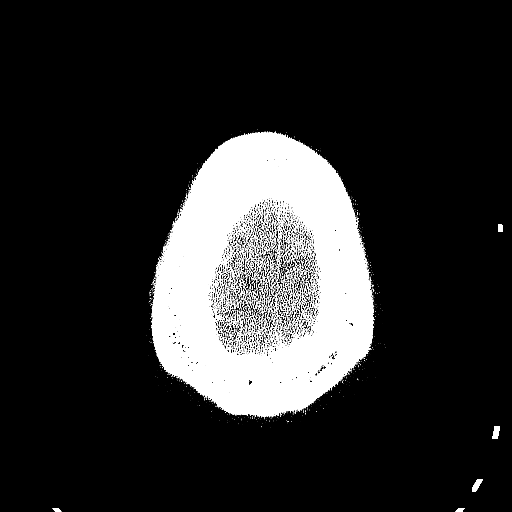
[im 76/85  brain]
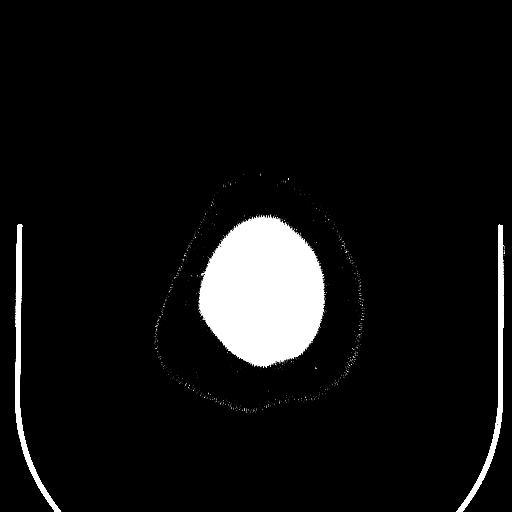
[im 76/85  bone]
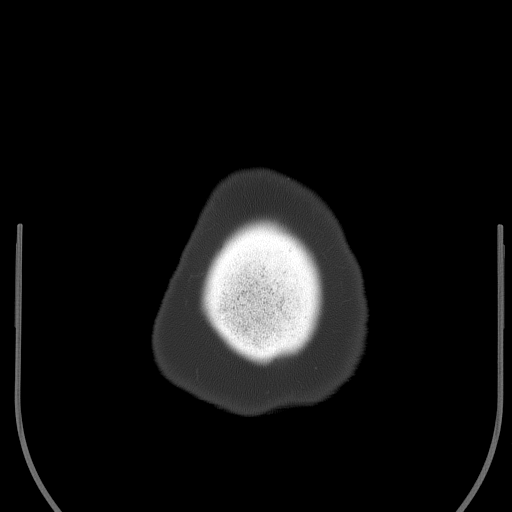

[Series 5: head 3.0 mpr cor · coronal · 0.35mm/px · 3 of 70 slices shown]
[im 24/70  brain]
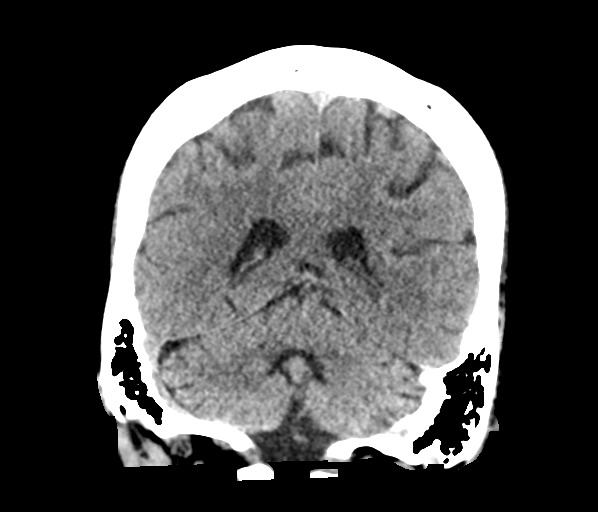
[im 31/70  brain]
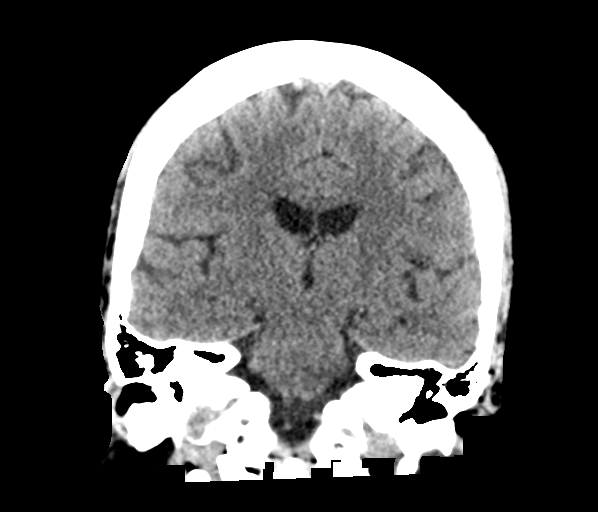
[im 39/70  brain]
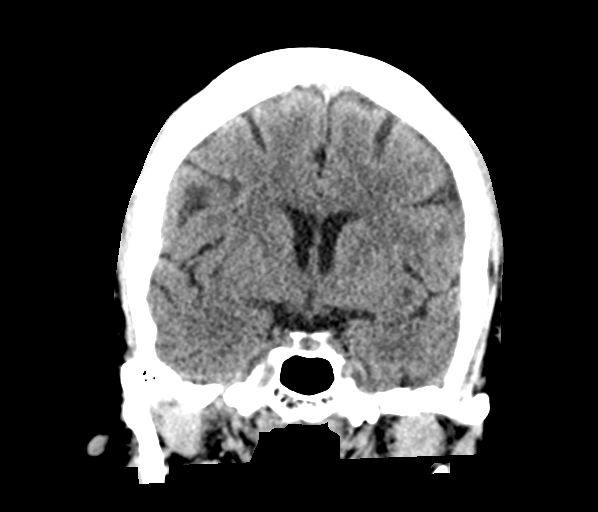

[Series 6: head 3.0 mpr sag · sagittal · 0.35mm/px · 3 of 53 slices shown]
[im 18/53  brain]
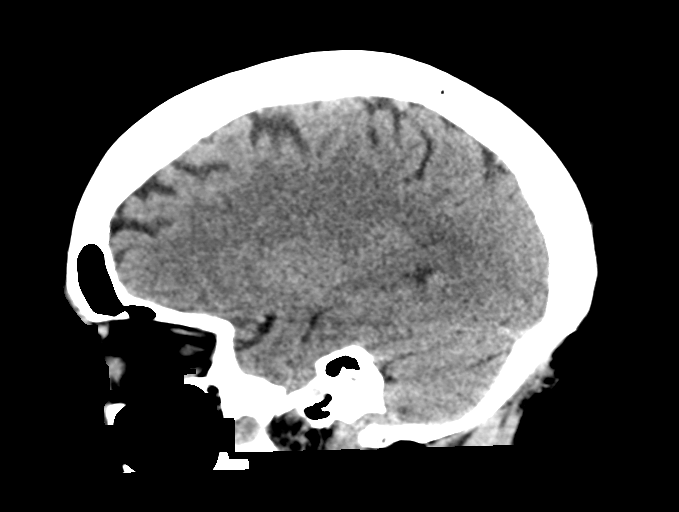
[im 27/53  brain]
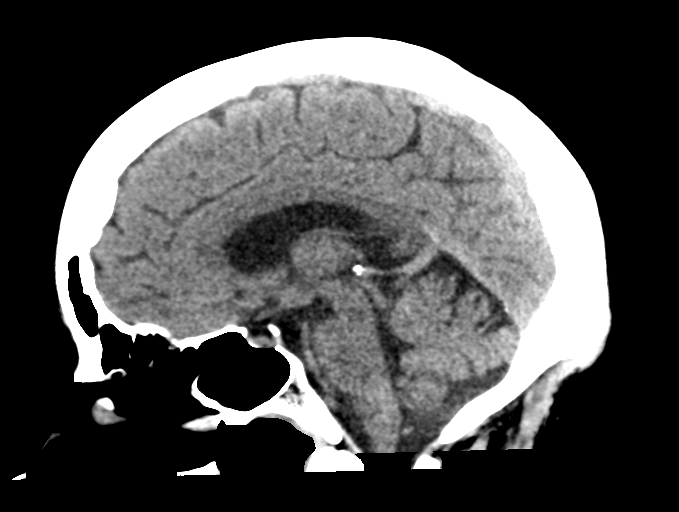
[im 35/53  brain]
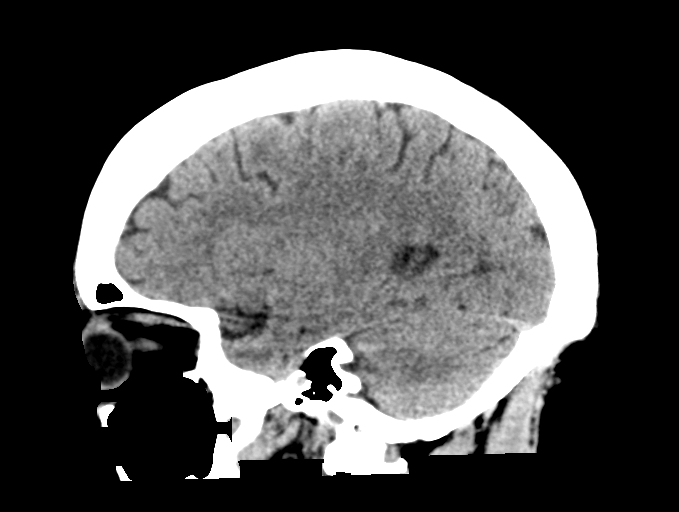

[15 of 47 positions shown; findings below may reference images not displayed]

FINDINGS: Brain: There is no evidence of an acute infarct, intracranial
hemorrhage, mass, midline shift, or extra-axial fluid collection.
Hypodensities in the cerebral white matter bilaterally are
nonspecific but compatible with mild chronic small vessel ischemic
disease. The ventricles and sulci are normal in size. Foci of
subependymal gray matter heterotopia are again noted along the atria
of the lateral ventricles.

Vascular: Flow diverter in the distal left ICA.

Skull: No fracture or suspicious osseous lesion.

Sinuses/Orbits: Minimal left ethmoid sinus mucosal thickening. Clear
mastoid air cells. Unremarkable orbits.

Other: None.
IMPRESSION: 1. No evidence of acute intracranial abnormality.
2. Mild chronic small vessel ischemic disease.

## 2020-08-13 MED ORDER — ACETAMINOPHEN 325 MG PO TABS
650.0000 mg | ORAL_TABLET | Freq: Four times a day (QID) | ORAL | Status: DC | PRN
  Administered 2020-08-13: 650 mg via ORAL
  Filled 2020-08-13: qty 2

## 2020-08-13 MED ORDER — METOCLOPRAMIDE HCL 10 MG PO TABS: 10.0000 mg | ORAL_TABLET | Freq: Four times a day (QID) | ORAL | 0 refills | Status: DC

## 2020-08-13 NOTE — ED Provider Notes (Signed)
[facility] Provider Note   CSN: [id number] Arrival date & time: 08/13/20  0940     History Chief Complaint  Patient presents with   Headache    Tammy Rodgers is a 60 y.o. female.  HPI Patient is a 60 year old female with a past medical history detailed below specifically notable for a brain aneurysm that has been monitored by her neurologist Krista Blue MD.  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  Patient is presented to the ER today with complaint of right-sided headache for the past 4 to 5 days.  She states her headache is not exertional seems to be right-sided only.  She called her PCP office and was given reassurance.  She came to the ER today she states because her PCP recommended ER for further evaluation/treatment.  She states that she has not had any weakness or numbness slurred speech.  She states that this morning she felt anxious that her headache is continued and therefore presented to the ER.  She describes her headache currently as improved from earlier this week.  States that it is currently 2/10.  She states that his right-sided she denies any visual symptoms.  Denies any confusion slurred speech no head trauma and denies any difficulty walking.  Denies any fever chills neck pain cough congestion nausea vomiting diarrhea lightheadedness or dizziness.  No other associate symptoms.  No aggravating mitigating factors.     Past Medical History:  Diagnosis Date   Anemia    Anxiety    Complex partial seizure disorder ([place])    last seizure 09/2012   GERD (gastroesophageal reflux disease)    High cholesterol    Hypertension    states BP is up and down; has been on med. x 2 mos.   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   Pre-diabetes    Seizures ([place])    Sleep apnea    uses CPAP nightly    Patient Active  Problem List   Diagnosis Date Noted   Tremor 02/09/2020   Gait abnormality 05/10/2019   Partial symptomatic epilepsy with complex partial seizures, not intractable, without status epilepticus ([place]) 05/10/2019   Brain aneurysm 05/09/2019   Bilateral carpal tunnel syndrome 04/28/2018   Paresthesia 01/28/2017   Encounter for therapeutic drug monitoring 04/17/2016   Falls 12/10/2015   Acute confusional state 12/10/2015   Weakness 12/10/2015   Seizure disorder, complex partial ([place]) 01/15/2014   Hypersomnia, persistent 03/06/2013   Obstructive sleep apnea 11/14/2012   Bloody discharge from left nipple 09/27/2012    Past Surgical History:  Procedure Laterality Date   ABDOMINAL HYSTERECTOMY  12/13/2002   partial   BREAST LUMPECTOMY WITH NEEDLE LOCALIZATION Left 11/24/2012   Procedure: LEFT BREAST LUMPECTOMY WITH NEEDLE LOCALIZATION;  Surgeon: Harl Bowie, MD;  Location: [facility];  Service: General;  Laterality: Left;   CHOLECYSTECTOMY     COLONOSCOPY     IR 3D INDEPENDENT WKST  07/11/2019   IR ANGIO INTRA EXTRACRAN SEL COM CAROTID INNOMINATE UNI R MOD SED  07/06/2019   IR ANGIO INTRA EXTRACRAN SEL INTERNAL CAROTID UNI L MOD SED  07/06/2019   IR ANGIO VERTEBRAL SEL VERTEBRAL UNI L MOD SED  07/06/2019   IR ANGIOGRAM FOLLOW UP STUDY  07/06/2019   IR RADIOLOGIST EVAL & MGMT  12/29/2019   IR RADIOLOGIST EVAL & MGMT  07/02/2020   IR TRANSCATH/EMBOLIZ  07/06/2019   IR US GUIDE VASC ACCESS RIGHT  07/06/2019   PARATHYROIDECTOMY     partial   RADIOLOGY WITH ANESTHESIA N/A 07/06/2019   Procedure: IR WITH ANESTHESIA EMBLOLIZATION;  Surgeon: Luanne Bras, MD;  Location: [facility];  Service: Radiology;  Laterality: N/A;     OB History   No obstetric history on file.     Family History  Problem Relation Age of Onset   Stroke Mother    Heart attack Mother    Diabetes Mother    COPD Father    Heart disease Other     Social History   Tobacco Use   Smoking status:  Never   Smokeless tobacco: Never  Vaping Use   Vaping Use: Never used  Substance Use Topics   Alcohol use: No   Drug use: No    Home Medications Prior to Admission medications   Medication Sig Start Date End Date Taking? Authorizing Provider  metoCLOPramide (REGLAN) 10 MG tablet Take 1 tablet (10 mg total) by mouth every 6 (six) hours. 08/13/20  Yes Mckell Riecke S, PA  amLODipine (NORVASC) 10 MG tablet Take 10 mg by mouth daily.     [provider]  aspirin EC 81 MG tablet Take 81 mg by mouth daily.    [provider]  cloBAZam (ONFI) 10 MG tablet Take 1 tablet (10 mg total) by mouth at bedtime. 02/09/20   Marcial Pacas, MD  ezetimibe-simvastatin (VYTORIN) 10-20 MG per tablet Take 1 tablet by mouth at bedtime.    [provider]  LAMICTAL 200 MG tablet TAKE 1 TABLET IN THE MORNING AND 1.5 TABLETS IN THE EVENING 02/09/20   Marcial Pacas, MD  losartan-hydrochlorothiazide (HYZAAR) 50-12.5 MG tablet Take 1 tablet by mouth daily.     [provider]  metFORMIN (GLUCOPHAGE) 500 MG tablet Take 250 mg by mouth daily.     [provider]  Multiple Vitamins-Minerals (MULTIVITAMIN WITH MINERALS) tablet Take 1 tablet by mouth daily.    [provider]  omeprazole (PRILOSEC) 20 MG capsule Take 20 mg by mouth daily.    [provider]    Allergies    Iodinated diagnostic agents and Ritalin [methylphenidate hcl]  Review of Systems   Review of Systems  Constitutional:  Negative for chills and fever.  HENT:  Negative for congestion.   Eyes:  Negative for pain.  Respiratory:  Negative for cough and shortness of breath.   Cardiovascular:  Negative for chest pain and leg swelling.  Gastrointestinal:  Negative for abdominal pain and vomiting.  Genitourinary:  Negative for dysuria.  Musculoskeletal:  Negative for myalgias.  Skin:  Negative for rash.  Neurological:  Positive for headaches. Negative for dizziness.   Physical Exam Updated Vital  Signs BP 108/70   Pulse 70   Temp 98.1 F (36.7 C) (Oral)   Resp 18   SpO2 98%   Physical Exam Vitals and nursing note reviewed.  Constitutional:      General: She is not in acute distress.    Comments: Pleasant well-appearing 60 year old.  In no acute distress.  Sitting comfortably in bed.  Able answer questions appropriately follow commands. No increased work of breathing. Speaking in full sentences.   HENT:     Head: Normocephalic and atraumatic.     Nose: Nose normal.     Mouth/Throat:     Mouth: Mucous membranes are moist.  Eyes:     General: No scleral icterus.  Comments: EOMI  Cardiovascular:     Rate and Rhythm: Normal rate and regular rhythm.     Pulses: Normal pulses.     Heart sounds: Normal heart sounds.  Pulmonary:     Effort: Pulmonary effort is normal. No respiratory distress.     Breath sounds: No wheezing.  Abdominal:     Palpations: Abdomen is soft.     Tenderness: There is no abdominal tenderness.  Musculoskeletal:     Cervical back: Normal range of motion.     Right lower leg: No edema.     Left lower leg: No edema.  Skin:    General: Skin is warm and dry.     Capillary Refill: Capillary refill takes less than 2 seconds.  Neurological:     Mental Status: She is alert. Mental status is at baseline.     Comments: Alert and oriented to self, place, time and event.   Speech is fluent, clear without dysarthria or dysphasia.   Strength 5/5 in upper/lower extremities   Sensation intact in upper/lower extremities   Normal gait -- ambulated in hallway without difficulty.  Normal finger-to-nose and feet tapping.  CN I not tested  CN II grossly intact visual fields bilaterally. Did not visualize posterior eye.  CN III, IV, VI PERRLA and EOMs intact bilaterally  CN V Intact sensation to sharp and light touch to the face  CN VII facial movements symmetric  CN VIII not tested  CN IX, X no uvula deviation, symmetric rise of soft palate  CN XI 5/5 SCM and  trapezius strength bilaterally  CN XII Midline tongue protrusion, symmetric L/R movements   Psychiatric:        Mood and Affect: Mood normal.        Behavior: Behavior normal.    ED Results / Procedures / Treatments   Labs (all labs ordered are listed, but only abnormal results are displayed) Labs Reviewed - No data to display  EKG None  Radiology CT Head Wo Contrast  Result Date: 08/13/2020 CLINICAL DATA:  Headache. EXAM: CT HEAD WITHOUT CONTRAST TECHNIQUE: Contiguous axial images were obtained from the base of the skull through the vertex without intravenous contrast. COMPARISON:  Head MRI 05/23/2020 FINDINGS: Brain: There is no evidence of an acute infarct, intracranial hemorrhage, mass, midline shift, or extra-axial fluid collection. Hypodensities in the cerebral white matter bilaterally are nonspecific but compatible with mild chronic small vessel ischemic disease. The ventricles and sulci are normal in size. 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 Vascular: Flow diverter in the distal left ICA. Skull: No fracture or suspicious osseous lesion. Sinuses/Orbits: Minimal left ethmoid sinus mucosal thickening. Clear mastoid air cells. Unremarkable orbits. Other: None. IMPRESSION: 1. No evidence of acute intracranial abnormality. 2. Mild chronic small vessel ischemic disease. Electronically Signed   By: Logan Bores M.D.   On: 08/13/2020 15:29    Procedures Procedures   Medications Ordered in ED Medications - No data to display   ED Course  I have reviewed the triage vital signs and the nursing notes.  Pertinent labs & imaging results that were available during my care of the patient were reviewed by me and considered in my medical decision making (see chart for details).    MDM Rules/Calculators/A&P                           Patient is a 60 year old female  presented today with right-sided headache has been ongoing for  5 days now.  It is nonexertional pain.   She does have a pertinent past medical history of a 5 mm saccular aneurysm that was intervened on by IR in early 2022/January 2022.  Her neurologic exam is unremarkable.  She has not complained of any neurologic symptoms either.  Denies any weakness numbness slurred speech confusion.  She has no exertional aggravating of her headache.  Has no visual symptoms.  No disequilibrium or difficulty walking.  Overall she is well-appearing has normal vital signs.  Has improving headache.  Was given 1 dose of Tylenol at her request I also offered her migraine cocktail in the form of Benadryl Reglan and Tylenol however she declined this.  Obtain CT imaging of head which was negative for any intracerebral hemorrhage.  There is no hydrocephalus or any acute abnormalities on CT scan some microvascular chronic disease.  Patient reassessed continues to have a normal neurologic exam.  There was a significant delay in patient CT head results due to CT scanner shutting down during/immediately following image capture.  I had lengthy discussion with CT tech and also talk to radiology secretary in order to facilitate quick interpretation of delayed CT captured images.  Patient was very understanding throughout this procedure.  At no point did she display any concerning neurologic symptoms concerning for acute disease needing immediate recheck.  She is not complaining of any other symptoms besides headache that is completely abated with Tylenol.  Will discharge home with close follow-up with her neurologist and very strict very specific return precautions detailed in AVS.  Final Clinical Impression(s) / ED Diagnoses Final diagnoses:  Acute nonintractable headache, unspecified headache type    Rx / DC Orders ED Discharge Orders          Ordered    metoCLOPramide (REGLAN) 10 MG tablet  Every 6 hours        08/13/20 [place], Latricia Cerrito [place], [place] 08/15/20  [id number]    Charlesetta Shanks, MD 08/16/20 1110

## 2020-08-13 NOTE — ED Notes (Signed)
Pt NAD, a.ox4. pt verbalizes understanding of all DC and f/u instructions. All questions answered. Pt walks with steady gait at DC to lobby

## 2020-08-13 NOTE — ED Triage Notes (Addendum)
Pt presents with a headache c/o right side of her head since Thursday. Spoke with the RN at her PCP they determined she was not having a stroke. Sr. Jacelyn Grip with [facility] called her Friday and asked her to come to ED for further evaluation d/t hx of aneurysm. He is requesting CT and MRI of the brain. Pt did not want to come in over the weekend, woke this am with anxiety. No neuro deficients noted.

## 2020-08-13 NOTE — Discharge Instructions (Addendum)
Please follow-up with your neurologist and your primary care provider.  Please drink plenty of water.  I prescribed you a medication called Reglan you may take with this with 25 mg of Benadryl this should significantly improve your headache.  Please drink plenty of water.  Return to the ER for new or concerning symptoms.

## 2020-09-26 ENCOUNTER — Encounter: Admitting: Obstetrics and Gynecology

## 2020-10-16 ENCOUNTER — Telehealth: Admitting: Family

## 2020-10-16 DIAGNOSIS — Z20822 Contact with and (suspected) exposure to covid-19: Secondary | ICD-10-CM

## 2020-10-17 ENCOUNTER — Telehealth (INDEPENDENT_AMBULATORY_CARE_PROVIDER_SITE_OTHER): Payer: Self-pay | Admitting: Neurology

## 2020-10-17 ENCOUNTER — Telehealth: Payer: Self-pay | Admitting: Neurology

## 2020-10-17 ENCOUNTER — Encounter: Payer: Self-pay | Admitting: Neurology

## 2020-10-17 DIAGNOSIS — I671 Cerebral aneurysm, nonruptured: Secondary | ICD-10-CM

## 2020-10-17 DIAGNOSIS — G40209 Localization-related (focal) (partial) symptomatic epilepsy and epileptic syndromes with complex partial seizures, not intractable, without status epilepticus: Secondary | ICD-10-CM

## 2020-10-17 DIAGNOSIS — R519 Headache, unspecified: Secondary | ICD-10-CM

## 2020-10-17 MED ORDER — BENZONATATE 100 MG PO CAPS: 100.0000 mg | ORAL_CAPSULE | Freq: Three times a day (TID) | ORAL | 0 refills | Status: DC | PRN

## 2020-10-17 MED ORDER — LAMICTAL 200 MG PO TABS: ORAL_TABLET | ORAL | 4 refills | Status: DC

## 2020-10-17 MED ORDER — FLUTICASONE PROPIONATE 50 MCG/ACT NA SUSP: 2.0000 | Freq: Every day | NASAL | 6 refills | Status: DC

## 2020-10-17 NOTE — Progress Notes (Signed)
E-Visit for Corona Virus Screening  Your current symptoms could be consistent with the coronavirus.  Many health care providers can now test patients at their office but not all are.  [facility] has multiple testing sites. For information on our COVID testing locations and hours go to [email or web address]  We are enrolling you in our MyChart Home Monitoring for COVID19 . Daily you will receive a questionnaire within the MyChart website. Our COVID 19 response team will be monitoring your responses daily.  Testing Information: The COVID-19 Community Testing sites are testing BY APPOINTMENT ONLY.  You can schedule online at [email or web address]  If you do not have access to a smart phone or computer you may call [phone] for an appointment.   Additional testing sites in the Community:  For [facility] Testing sites in [place]  https://www.[facility]vs.com/minuteclinic/covid-19-testing  For Pop-up testing sites in [place]  https://covid19.ncdhhs.gov/about-covid-19/testing/find-my-testing-place/pop-testing-sites  For [facility] https://[place].gov[place]/human-services/health-department/coronavirus-covid-19-info/covid-19-testing  For [place] testing in [place] https://www.[place].gov[place]/human-services/health-department/coronavirus-covid-19-info/covid-19-testing  For [facility] testing in [place]   https://[facility].care/covidtesting  For  more information about community testing call [phone]   Please quarantine yourself while awaiting your test results. Please stay home for a minimum of 10 days from the first day of illness with improving symptoms and you have had 24 hours of no fever (without the use of Tylenol (Acetaminophen) Motrin (Ibuprofen) or any fever reducing medication).  Also - Do not get tested prior to returning to work because once you have had a positive test the test can stay positive for  more than a month in some cases.   You should wear a mask or cloth face covering over your nose and mouth if you must be around other people or animals, including pets (even at home). Try to stay at least 6 feet away from other people. This will protect the people around you.  Please continue good preventive care measures, including:  frequent hand-washing, avoid touching your face, cover coughs/sneezes, stay out of crowds and keep a 6 foot distance from others.  COVID-19 is a respiratory illness with symptoms that are similar to the flu. Symptoms are typically mild to moderate, but there have been cases of severe illness and death due to the virus.   The following symptoms may appear 2-14 days after exposure: Fever Cough Shortness of breath or difficulty breathing Chills Repeated shaking with chills Muscle pain Headache Sore throat New loss of taste or smell Fatigue Congestion or runny nose Nausea or vomiting Diarrhea  Go to the nearest hospital ED for assessment if fever/cough/breathlessness are severe or illness seems like a threat to life.  It is vitally important that if you feel that you have an infection such as this virus or any other virus that you stay home and away from places where you may spread it to others.  You should avoid contact with people age 65 and older.   You can use medication such as prescription cough medication called Tessalon Perles 100 mg. You may take 1-2 capsules every 8 hours as needed for cough and prescription for Fluticasone nasal spray 2 sprays in each nostril one time per day  You may also take acetaminophen (Tylenol) as needed for fever.  Reduce your risk of any infection by using the same precautions used for avoiding the common cold or flu:  Wash your hands often with soap and warm water for at least 20 seconds.  If soap and water are not readily available, use an alcohol-based   hand sanitizer with at least 60% alcohol.  If coughing or sneezing,  cover your mouth and nose by coughing or sneezing into the elbow areas of your shirt or coat, into a tissue or into your sleeve (not your hands). Avoid shaking hands with others and consider head nods or verbal greetings only. Avoid touching your eyes, nose, or mouth with unwashed hands.  Avoid close contact with people who are sick. Avoid places or events with large numbers of people in one location, like concerts or sporting events. Carefully consider travel plans you have or are making. If you are planning any travel outside or inside the [place], visit the CDC's Travelers' Health webpage for the latest health notices. If you have some symptoms but not all symptoms, continue to monitor at home and seek medical attention if your symptoms worsen. If you are having a medical emergency, call 911.  HOME CARE Only take medications as instructed by your medical team. Drink plenty of fluids and get plenty of rest. A steam or ultrasonic humidifier can help if you have congestion.   GET HELP RIGHT AWAY IF YOU HAVE EMERGENCY WARNING SIGNS** FOR COVID-19. If you or someone is showing any of these signs seek emergency medical care immediately. Call 911 or proceed to your closest emergency facility if: You develop worsening high fever. Trouble breathing Bluish lips or face Persistent pain or pressure in the chest New confusion Inability to wake or stay awake You cough up blood. Your symptoms become more severe  **This list is not all possible symptoms. Contact your medical provider for any symptoms that are sever or concerning to you.  MAKE SURE YOU  Understand these instructions. Will watch your condition. Will get help right away if you are not doing well or get worse.  Your e-visit answers were reviewed by a board certified advanced clinical practitioner to complete your personal care plan.  Depending on the condition, your plan could have included both over the counter or prescription  medications.  If there is a problem please reply once you have received a response from your provider.  Your safety is important to us.  If you have drug allergies check your prescription carefully.    You can use MyChart to ask questions about today's visit, request a non-urgent call back, or ask for a work or school excuse for 24 hours related to this e-Visit. If it has been greater than 24 hours you will need to follow up with your provider, or enter a new e-Visit to address those concerns. You will get an e-mail in the next two days asking about your experience.  I hope that your e-visit has been valuable and will speed your recovery. Thank you for using e-visits.   Approximately 5 minutes was spent documenting and reviewing patient's chart.  
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
## 2020-10-17 NOTE — Telephone Encounter (Signed)
[facility] order sent to [facility], [facility] they will reach out to the patient to schedule.

## 2020-10-30 ENCOUNTER — Other Ambulatory Visit: Payer: Self-pay

## 2020-10-30 ENCOUNTER — Encounter: Payer: Self-pay | Admitting: Obstetrics and Gynecology

## 2020-10-30 ENCOUNTER — Ambulatory Visit
Admission: RE | Admit: 2020-10-30 | Discharge: 2020-10-30 | Disposition: A | Discharge status: Home / Self Care | Source: Ambulatory Visit | Attending: Neurology | Admitting: Neurology

## 2020-10-30 ENCOUNTER — Ambulatory Visit (INDEPENDENT_AMBULATORY_CARE_PROVIDER_SITE_OTHER): Admitting: Obstetrics and Gynecology

## 2020-10-30 DIAGNOSIS — N951 Menopausal and female climacteric states: Secondary | ICD-10-CM | POA: Diagnosis not present

## 2020-10-30 DIAGNOSIS — Z90710 Acquired absence of both cervix and uterus: Secondary | ICD-10-CM | POA: Diagnosis not present

## 2020-10-30 DIAGNOSIS — Z01419 Encounter for gynecological examination (general) (routine) without abnormal findings: Secondary | ICD-10-CM

## 2020-10-30 DIAGNOSIS — I671 Cerebral aneurysm, nonruptured: Secondary | ICD-10-CM

## 2020-10-30 MED ORDER — ESTROGENS CONJUGATED 0.45 MG PO TABS: 0.4500 mg | ORAL_TABLET | Freq: Every day | ORAL | 4 refills | Status: DC

## 2020-10-30 NOTE — Progress Notes (Signed)
GYNECOLOGY ANNUAL PREVENTATIVE CARE ENCOUNTER NOTE  History:     Tammy Rodgers is a 60 y.o. G28P2002 female here for a routine annual gynecologic exam.  Current complaints: occasional night sweats for the last few months.   Denies abnormal vaginal bleeding, discharge, pelvic pain, problems with intercourse or other gynecologic concerns.    Gynecologic History No LMP recorded. Patient has had a hysterectomy. Contraception: status post hysterectomy Last Pap: n/a 2/2 hyst Last mammogram: see MA note, 5-6 months ago. Results were: normal per patient, not in care everywhere  Obstetric History OB History  Gravida Para Term Preterm AB Living  2 2 2     2   SAB IAB Ectopic Multiple Live Births          2    # Outcome Date GA Lbr Len/2nd Weight Sex Delivery Anes PTL Lv  2 Term     F    LIV  1 Term     F    LIV    Past Medical History:  Diagnosis Date   Anemia    Anxiety    Complex partial seizure disorder ([place])    last seizure 09/2012   GERD (gastroesophageal reflux disease)    High cholesterol    Hypertension    states BP is up and down; has been on med. x 2 mos.   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   Pre-diabetes    Seizures ([place])    Sleep apnea    uses CPAP nightly    Past Surgical History:  Procedure Laterality Date   ABDOMINAL HYSTERECTOMY  12/13/2002   partial   BREAST LUMPECTOMY WITH NEEDLE LOCALIZATION Left 11/24/2012   Procedure: LEFT BREAST LUMPECTOMY WITH NEEDLE LOCALIZATION;  Surgeon: Harl Bowie, MD;  Location: [facility];  Service: General;  Laterality: Left;   CHOLECYSTECTOMY     COLONOSCOPY     IR 3D INDEPENDENT WKST  07/11/2019   IR ANGIO INTRA EXTRACRAN SEL COM CAROTID INNOMINATE UNI R MOD SED  07/06/2019   IR ANGIO INTRA EXTRACRAN SEL INTERNAL CAROTID UNI L MOD SED  07/06/2019   IR ANGIO VERTEBRAL SEL VERTEBRAL UNI L MOD SED  07/06/2019   IR ANGIOGRAM FOLLOW UP STUDY  07/06/2019   IR RADIOLOGIST EVAL & MGMT  12/29/2019   IR  RADIOLOGIST EVAL & MGMT  07/02/2020   IR TRANSCATH/EMBOLIZ  07/06/2019   IR US GUIDE VASC ACCESS RIGHT  07/06/2019   PARATHYROIDECTOMY     partial   RADIOLOGY WITH ANESTHESIA N/A 07/06/2019   Procedure: IR WITH ANESTHESIA EMBLOLIZATION;  Surgeon: Luanne Bras, MD;  Location: [facility];  Service: Radiology;  Laterality: N/A;    Current Outpatient Medications on File Prior to Visit  Medication Sig Dispense Refill   amLODipine (NORVASC) 10 MG tablet Take 10 mg by mouth daily.      aspirin EC 81 MG tablet Take 81 mg by mouth daily.     busPIRone (BUSPAR) 10 MG tablet Take 10 mg by mouth 3 (three) times daily.     ezetimibe-simvastatin (VYTORIN) 10-20 MG per tablet Take 1 tablet by mouth at bedtime.     LAMICTAL 200 MG tablet TAKE 1 TABLET IN THE MORNING AND 1.5 TABLETS IN THE EVENING 225 tablet 4   losartan-hydrochlorothiazide (HYZAAR) 50-12.5 MG tablet Take 1 tablet by mouth daily.      Multiple Vitamins-Minerals (MULTIVITAMIN WITH MINERALS) tablet Take 1 tablet by mouth daily.     omeprazole (PRILOSEC) 20 MG capsule  Take 20 mg by mouth daily.     benzonatate (TESSALON PERLES) 100 MG capsule Take 1 capsule (100 mg total) by mouth 3 (three) times daily as needed. (Patient not taking: Reported on 10/30/2020) 20 capsule 0   fluticasone (FLONASE) 50 MCG/ACT nasal spray Place 2 sprays into both nostrils daily. (Patient not taking: Reported on 10/30/2020) 16 g 6   metFORMIN (GLUCOPHAGE) 500 MG tablet Take 250 mg by mouth daily.      metoCLOPramide (REGLAN) 10 MG tablet Take 1 tablet (10 mg total) by mouth every 6 (six) hours. 30 tablet 0   No current facility-administered medications on file prior to visit.    Allergies  Allergen Reactions   Iodinated Diagnostic Agents Hypertension   Ritalin [Methylphenidate Hcl]     Seizure    Social History:  reports that she has never smoked. She has never used smokeless tobacco. She reports that she does not drink alcohol and does not use drugs.  Family  History  Problem Relation Age of Onset   Stroke Mother    Heart attack Mother    Diabetes Mother    COPD Father    Heart disease Other     The following portions of the patient's history were reviewed and updated as appropriate: allergies, current medications, past family history, past medical history, past social history, past surgical history and problem list.  Review of Systems Pertinent items noted in HPI and remainder of comprehensive ROS otherwise negative.  Physical Exam:  BP 114/68   Pulse 80   Ht 5\' 3"  (1.6 m)   Wt 172 lb (78 kg)   BMI 30.47 kg/m  CONSTITUTIONAL: Well-developed, well-nourished female in no acute distress.  HENT:  Normocephalic, atraumatic, External right and left ear normal. Oropharynx is clear and moist EYES: Conjunctivae and EOM are normal.  NECK: Normal range of motion, supple, no masses.  Normal thyroid.  SKIN: Skin is warm and dry. No rash noted. Not diaphoretic. No erythema. No pallor. MUSCULOSKELETAL: Normal range of motion. No tenderness.  No cyanosis, clubbing, or edema.  2+ distal pulses. NEUROLOGIC: Alert and oriented to person, place, and time. Normal reflexes, muscle tone coordination.  PSYCHIATRIC: Normal mood and affect. Normal behavior. Normal judgment and thought content. CARDIOVASCULAR: Normal heart rate noted, regular rhythm RESPIRATORY: Clear to auscultation bilaterally. Effort and breath sounds normal, no problems with respiration noted. BREASTS: Symmetric in size. No masses, tenderness, skin changes, nipple drainage, or lymphadenopathy bilaterally. Performed in the presence of a chaperone. ABDOMEN: Soft, no distention noted.  No tenderness, rebound or guarding.  PELVIC: Normal appearing external genitalia and urethral meatus; normal appearing vaginal mucosa.  No abnormal discharge noted.  Uterus and cervix were absent.  Performed in the presence of a chaperone.   Assessment and Plan:    1. Encounter for annual routine gynecological  examination Normal annual exam  2. Hx of hysterectomy Uterus and cervix absent, unsure of ovary status  3. Vasomotor symptoms due to menopause Will give 3 month trial of low dose HRT - estrogens, conjugated, (PREMARIN) 0.45 MG tablet; Take 1 tablet (0.45 mg total) by mouth daily.  Dispense: 30 tablet; Refill: 4  Will follow up results of pap smear and manage accordingly. Mammogram scheduled Routine preventative health maintenance measures emphasized. Please refer to After Visit Summary for other counseling recommendations.     Gyn follow up in 3 months with virtual visit to discuss HRT and any potential dose management Lynnda Shields, MD, [place], [facility]

## 2020-10-30 NOTE — Progress Notes (Signed)
Pt states she had mammo 5-6 mo ago with [facility].   Pt states she is unsure if she had total or partial hyst.  Here today for routine GYN care.

## 2020-12-15 ENCOUNTER — Telehealth: Payer: Self-pay | Admitting: Neurology

## 2020-12-15 NOTE — Progress Notes (Signed)
Chart reviewed, agree above plan ?

## 2020-12-15 NOTE — Telephone Encounter (Signed)
Please give her follow-up appointment with me next available

## 2021-01-02 ENCOUNTER — Telehealth (HOSPITAL_COMMUNITY): Payer: Self-pay

## 2021-01-02 NOTE — Telephone Encounter (Signed)
Called pt regarding recent imaging, no answer, left vm. AW  

## 2021-03-05 ENCOUNTER — Ambulatory Visit: Admitting: Neurology

## 2021-03-19 ENCOUNTER — Ambulatory Visit (INDEPENDENT_AMBULATORY_CARE_PROVIDER_SITE_OTHER): Admitting: Neurology

## 2021-03-19 ENCOUNTER — Encounter: Payer: Self-pay | Admitting: Neurology

## 2021-03-19 VITALS — BP 98/60 | HR 64 | Ht 63.0 in | Wt 172.5 lb

## 2021-03-19 DIAGNOSIS — I671 Cerebral aneurysm, nonruptured: Secondary | ICD-10-CM | POA: Diagnosis not present

## 2021-03-19 DIAGNOSIS — G5603 Carpal tunnel syndrome, bilateral upper limbs: Secondary | ICD-10-CM | POA: Diagnosis not present

## 2021-03-19 DIAGNOSIS — G40209 Localization-related (focal) (partial) symptomatic epilepsy and epileptic syndromes with complex partial seizures, not intractable, without status epilepticus: Secondary | ICD-10-CM | POA: Diagnosis not present

## 2021-03-19 MED ORDER — LAMICTAL 200 MG PO TABS: ORAL_TABLET | ORAL | 4 refills | Status: DC
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
## 2021-03-20 LAB — LIPID PANEL
Chol/HDL Ratio: 2.1 ratio (ref 0.0–4.4)
Cholesterol, Total: 158 mg/dL (ref 100–199)
HDL: 76 mg/dL (ref >39)
LDL Chol Calc (NIH): 72 mg/dL (ref 0–99)
Triglycerides: 48 mg/dL (ref 0–149)
VLDL Cholesterol Cal: 10 mg/dL (ref 5–40)

## 2021-03-20 LAB — VITAMIN D 25 HYDROXY (VIT D DEFICIENCY, FRACTURES): Vit D, 25-Hydroxy: 41.6 ng/mL (ref 30.0–100.0)

## 2021-03-20 LAB — TSH: TSH: 2.46 u[IU]/mL (ref 0.450–4.500)

## 2021-06-11 ENCOUNTER — Telehealth (HOSPITAL_COMMUNITY): Payer: Self-pay

## 2021-06-11 ENCOUNTER — Other Ambulatory Visit (HOSPITAL_COMMUNITY): Payer: Self-pay | Admitting: Interventional Radiology

## 2021-06-11 DIAGNOSIS — I671 Cerebral aneurysm, nonruptured: Secondary | ICD-10-CM

## 2021-06-11 NOTE — Telephone Encounter (Signed)
Called to schedule mra, no answer, left vm. AW  ?

## 2021-09-05 ENCOUNTER — Ambulatory Visit (HOSPITAL_BASED_OUTPATIENT_CLINIC_OR_DEPARTMENT_OTHER)
Admission: RE | Admit: 2021-09-05 | Discharge: 2021-09-05 | Disposition: A | Discharge status: Home / Self Care | Source: Ambulatory Visit | Attending: Obstetrics and Gynecology | Admitting: Obstetrics and Gynecology

## 2021-09-05 ENCOUNTER — Ambulatory Visit: Admitting: Obstetrics and Gynecology

## 2021-09-05 ENCOUNTER — Other Ambulatory Visit (INDEPENDENT_AMBULATORY_CARE_PROVIDER_SITE_OTHER): Admitting: Obstetrics and Gynecology

## 2021-09-05 DIAGNOSIS — R109 Unspecified abdominal pain: Secondary | ICD-10-CM

## 2021-09-05 DIAGNOSIS — Z90710 Acquired absence of both cervix and uterus: Secondary | ICD-10-CM

## 2021-09-05 NOTE — Progress Notes (Signed)
Order placed for pelvic ultrasound. Pt with cramping and hx of hysterectomy.  Lynnda Shields, MD

## 2021-10-10 ENCOUNTER — Ambulatory Visit: Admitting: Obstetrics and Gynecology

## 2021-10-10 VITALS — BP 112/67 | HR 71 | Wt 163.0 lb

## 2021-10-10 DIAGNOSIS — R102 Pelvic and perineal pain unspecified side: Secondary | ICD-10-CM

## 2021-10-10 DIAGNOSIS — N644 Mastodynia: Secondary | ICD-10-CM

## 2021-10-10 DIAGNOSIS — Z90710 Acquired absence of both cervix and uterus: Secondary | ICD-10-CM | POA: Diagnosis not present

## 2021-10-10 NOTE — Progress Notes (Signed)
Pt complains of pelvic pain, sharp pain, "ovulation pain". Pt also complains of breast tenderness - as if on cycle.

## 2021-10-11 NOTE — Progress Notes (Signed)
  CC: ultrasound follow up, pelvic pain Subjective:    Patient ID: Tammy Rodgers, female    DOB: 30-Aug-1960, 61 y.o.   MRN: [id number]  HPI Pt seen for follow up of abdominal/pelvic pain.  Chart reviewed in detail which showed hx of hysterectomy and removal of left ovary.  Current ultrasound does not show either ovary.  Pt states she still has occasional left sided crampy abdominal pain, now associated with breast tenderness like she was on her cycle.     Review of Systems     Objective:   Physical Exam Vitals:   10/10/21 1524  BP: 112/67  Pulse: 71         Assessment & Plan:   1. Pelvic pain in female Due to history of hysterectomy and absent left ovary, it is highly unlikely that the etiology of her pain would have a gyn origin.  As the pain is primarily left sided, there is concern for GI etiology such as diverticulosis.  Pt advised to follow up with GI or general surgery if possible as most of the female organs have been removed.  2. Breast pain in female Pt gets her mammograms through [facility], MD unable to review last mammogram which was recent. Have requested release of information regarding recent mammograms.  3. Hx of hysterectomy   F/u prn  I spent 20 minutes dedicated to the care of this patient including previsit review of records, face to face time with the patient discussing ultrasound results, disease etiology and post visit testing.   Griffin Basil, MD Faculty Attending, [facility]

## 2021-11-07 ENCOUNTER — Other Ambulatory Visit: Payer: Self-pay | Admitting: Physician Assistant

## 2021-11-07 ENCOUNTER — Other Ambulatory Visit (HOSPITAL_COMMUNITY): Payer: Self-pay | Admitting: Physician Assistant

## 2021-11-07 DIAGNOSIS — R1030 Lower abdominal pain, unspecified: Secondary | ICD-10-CM

## 2021-11-14 ENCOUNTER — Ambulatory Visit (HOSPITAL_COMMUNITY)
Admission: RE | Admit: 2021-11-14 | Discharge: 2021-11-14 | Disposition: A | Discharge status: Home / Self Care | Source: Ambulatory Visit | Attending: Physician Assistant | Admitting: Physician Assistant

## 2021-11-14 DIAGNOSIS — R1030 Lower abdominal pain, unspecified: Secondary | ICD-10-CM | POA: Diagnosis present

## 2022-01-20 ENCOUNTER — Encounter: Payer: Self-pay | Admitting: Neurology

## 2022-01-21 ENCOUNTER — Telehealth (HOSPITAL_COMMUNITY): Payer: Self-pay

## 2022-01-21 NOTE — Telephone Encounter (Signed)
Called to schedule mra, no answer, left vm. AW  ?

## 2022-02-03 ENCOUNTER — Ambulatory Visit (HOSPITAL_COMMUNITY)

## 2022-02-12 ENCOUNTER — Ambulatory Visit (HOSPITAL_COMMUNITY)
Admission: RE | Admit: 2022-02-12 | Discharge: 2022-02-12 | Disposition: A | Discharge status: Home / Self Care | Source: Ambulatory Visit | Attending: Interventional Radiology | Admitting: Interventional Radiology

## 2022-02-12 DIAGNOSIS — I671 Cerebral aneurysm, nonruptured: Secondary | ICD-10-CM | POA: Diagnosis not present

## 2022-02-13 ENCOUNTER — Telehealth (HOSPITAL_COMMUNITY): Payer: Self-pay

## 2022-02-13 NOTE — Telephone Encounter (Signed)
Pt agreed to f/u in 6 months with an mra along with consult. AB

## 2022-02-17 ENCOUNTER — Telehealth (HOSPITAL_COMMUNITY): Payer: Self-pay

## 2022-02-17 NOTE — Telephone Encounter (Signed)
-----   Message from Joaquim Nam, PA-C sent at 02/13/2022  3:19 PM EST ----- Regarding: Follow up Hello,    Dr. Estanislado Pandy has reviewed this patient's most recent imaging and recommends 6 month MRA head with a consult after the MRA is completed.  Thanks, Larene Beach

## 2022-03-25 ENCOUNTER — Encounter: Payer: Self-pay | Admitting: Neurology

## 2022-03-25 ENCOUNTER — Ambulatory Visit: Admitting: Neurology

## 2022-03-25 VITALS — BP 111/73 | HR 66 | Ht 63.0 in | Wt 163.0 lb

## 2022-03-25 DIAGNOSIS — G40209 Localization-related (focal) (partial) symptomatic epilepsy and epileptic syndromes with complex partial seizures, not intractable, without status epilepticus: Secondary | ICD-10-CM | POA: Diagnosis not present

## 2022-03-25 DIAGNOSIS — I671 Cerebral aneurysm, nonruptured: Secondary | ICD-10-CM | POA: Diagnosis not present

## 2022-03-25 NOTE — Patient Instructions (Signed)
Let me know when you are ready for Lamictal refills Continue follow up with Deveshwar for MRA in 6 months  Check labs today, continue Lamictal  See you back in 1 year
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
## 2022-04-13 ENCOUNTER — Encounter: Payer: Self-pay | Admitting: Neurology

## 2022-04-14 ENCOUNTER — Other Ambulatory Visit (INDEPENDENT_AMBULATORY_CARE_PROVIDER_SITE_OTHER): Payer: Self-pay

## 2022-04-14 DIAGNOSIS — Z0289 Encounter for other administrative examinations: Secondary | ICD-10-CM

## 2022-04-16 ENCOUNTER — Telehealth: Payer: Self-pay | Admitting: Neurology

## 2022-04-16 DIAGNOSIS — G40209 Localization-related (focal) (partial) symptomatic epilepsy and epileptic syndromes with complex partial seizures, not intractable, without status epilepticus: Secondary | ICD-10-CM

## 2022-04-16 LAB — COMPREHENSIVE METABOLIC PANEL
ALT: 35 IU/L — ABNORMAL HIGH (ref 0–32)
AST: 30 IU/L (ref 0–40)
Albumin/Globulin Ratio: 1.8 (ref 1.2–2.2)
Albumin: 4.8 g/dL (ref 3.8–4.9)
Alkaline Phosphatase: 98 IU/L (ref 44–121)
BUN/Creatinine Ratio: 18 (ref 12–28)
BUN: 15 mg/dL (ref 8–27)
Bilirubin Total: 0.4 mg/dL (ref 0.0–1.2)
CO2: 27 mmol/L (ref 20–29)
Calcium: 10.7 mg/dL — ABNORMAL HIGH (ref 8.7–10.3)
Chloride: 101 mmol/L (ref 96–106)
Creatinine, Ser: 0.82 mg/dL (ref 0.57–1.00)
Globulin, Total: 2.7 g/dL (ref 1.5–4.5)
Glucose: 94 mg/dL (ref 70–99)
Potassium: 3.9 mmol/L (ref 3.5–5.2)
Sodium: 142 mmol/L (ref 134–144)
Total Protein: 7.5 g/dL (ref 6.0–8.5)
eGFR: 82 mL/min/{1.73_m2} (ref >59)

## 2022-04-16 LAB — CBC WITH DIFFERENTIAL/PLATELET
Basophils Absolute: 0 10*3/uL (ref 0.0–0.2)
Basos: 1 %
EOS (ABSOLUTE): 0.1 10*3/uL (ref 0.0–0.4)
Eos: 2 %
Hematocrit: 35.7 % (ref 34.0–46.6)
Hemoglobin: 11.9 g/dL (ref 11.1–15.9)
Immature Grans (Abs): 0 10*3/uL (ref 0.0–0.1)
Immature Granulocytes: 0 %
Lymphocytes Absolute: 1.6 10*3/uL (ref 0.7–3.1)
Lymphs: 43 %
MCH: 30 pg (ref 26.6–33.0)
MCHC: 33.3 g/dL (ref 31.5–35.7)
MCV: 90 fL (ref 79–97)
Monocytes Absolute: 0.4 10*3/uL (ref 0.1–0.9)
Monocytes: 10 %
Neutrophils Absolute: 1.6 10*3/uL (ref 1.4–7.0)
Neutrophils: 44 %
Platelets: 246 10*3/uL (ref 150–450)
RBC: 3.97 x10E6/uL (ref 3.77–5.28)
RDW: 13.1 % (ref 11.7–15.4)
WBC: 3.7 10*3/uL (ref 3.4–10.8)

## 2022-04-16 LAB — LAMOTRIGINE LEVEL: Lamotrigine Lvl: 24.7 ug/mL (ref 2.0–20.0)

## 2022-04-16 NOTE — Telephone Encounter (Signed)
Please call the patient, we need to recheck her Lamictal level as a trough level (1st thing AM prior to taking 1st dose of Lamictal). Came back elevated at 24.7. please ensure no signs of toxicity. Rest of labs are okay, calcium is mildly elevated for unknown reason at 10.7, ALT slightly high at 35.   Orders Placed This Encounter  Procedures   Lamotrigine level

## 2022-04-16 NOTE — Telephone Encounter (Signed)
Called and lvm per DPR, but requested a call back to see how she is doing with medication. Will wait for call back and also send a [facility] message

## 2022-04-17 ENCOUNTER — Other Ambulatory Visit (INDEPENDENT_AMBULATORY_CARE_PROVIDER_SITE_OTHER): Payer: Self-pay

## 2022-04-17 DIAGNOSIS — Z0289 Encounter for other administrative examinations: Secondary | ICD-10-CM

## 2022-04-17 DIAGNOSIS — G40209 Localization-related (focal) (partial) symptomatic epilepsy and epileptic syndromes with complex partial seizures, not intractable, without status epilepticus: Secondary | ICD-10-CM

## 2022-04-18 LAB — LAMOTRIGINE LEVEL: Lamotrigine Lvl: 20.9 ug/mL (ref 2.0–20.0)

## 2022-04-22 ENCOUNTER — Encounter: Payer: Self-pay | Admitting: Neurology

## 2022-08-11 ENCOUNTER — Encounter: Payer: Self-pay | Admitting: Neurology

## 2022-08-11 DIAGNOSIS — G40209 Localization-related (focal) (partial) symptomatic epilepsy and epileptic syndromes with complex partial seizures, not intractable, without status epilepticus: Secondary | ICD-10-CM

## 2022-08-12 ENCOUNTER — Other Ambulatory Visit (INDEPENDENT_AMBULATORY_CARE_PROVIDER_SITE_OTHER): Payer: Self-pay

## 2022-08-12 DIAGNOSIS — G40209 Localization-related (focal) (partial) symptomatic epilepsy and epileptic syndromes with complex partial seizures, not intractable, without status epilepticus: Secondary | ICD-10-CM

## 2022-08-12 DIAGNOSIS — Z0289 Encounter for other administrative examinations: Secondary | ICD-10-CM

## 2022-08-12 MED ORDER — LAMICTAL 200 MG PO TABS: ORAL_TABLET | ORAL | 4 refills | Status: DC

## 2022-08-12 NOTE — Addendum Note (Signed)
Addended by: Danne Harbor on: 08/12/2022 04:55 PM   Modules accepted: Orders

## 2022-08-28 ENCOUNTER — Other Ambulatory Visit (HOSPITAL_COMMUNITY): Payer: Self-pay | Admitting: Interventional Radiology

## 2022-08-28 ENCOUNTER — Telehealth (HOSPITAL_COMMUNITY): Payer: Self-pay

## 2022-08-28 DIAGNOSIS — I671 Cerebral aneurysm, nonruptured: Secondary | ICD-10-CM

## 2022-08-28 NOTE — Telephone Encounter (Signed)
Called to schedule mra, no answer, left vm. AB

## 2022-09-08 ENCOUNTER — Ambulatory Visit (HOSPITAL_COMMUNITY)
Admission: RE | Admit: 2022-09-08 | Discharge: 2022-09-08 | Disposition: A | Discharge status: Home / Self Care | Source: Ambulatory Visit | Attending: Interventional Radiology | Admitting: Interventional Radiology

## 2022-09-08 DIAGNOSIS — I671 Cerebral aneurysm, nonruptured: Secondary | ICD-10-CM | POA: Diagnosis present

## 2022-10-01 ENCOUNTER — Telehealth (HOSPITAL_COMMUNITY): Payer: Self-pay

## 2022-10-01 ENCOUNTER — Other Ambulatory Visit (HOSPITAL_COMMUNITY): Payer: Self-pay | Admitting: Interventional Radiology

## 2022-10-01 DIAGNOSIS — I671 Cerebral aneurysm, nonruptured: Secondary | ICD-10-CM

## 2022-10-01 NOTE — Telephone Encounter (Signed)
Called to schedule consult to discuss recent imaging, no answer, left vm. AB

## 2022-10-06 ENCOUNTER — Telehealth (HOSPITAL_COMMUNITY): Payer: Self-pay

## 2022-10-06 NOTE — Telephone Encounter (Signed)
Called to schedule consult, no answer, left vm. AB

## 2022-10-23 ENCOUNTER — Telehealth (HOSPITAL_COMMUNITY): Payer: Self-pay

## 2022-10-23 NOTE — Telephone Encounter (Signed)
-----   Message from Hoyt Koch sent at 10/01/2022  2:39 PM EDT ----- Dr. Corliss Skains would like to meet with Mrs.  Mccaffery in consultation to discuss results.   Thanks, Lannette Donath

## 2022-10-31 ENCOUNTER — Ambulatory Visit (HOSPITAL_COMMUNITY)
Admission: RE | Admit: 2022-10-31 | Discharge: 2022-10-31 | Disposition: A | Discharge status: Home / Self Care | Source: Ambulatory Visit | Attending: Interventional Radiology | Admitting: Interventional Radiology

## 2022-10-31 DIAGNOSIS — I671 Cerebral aneurysm, nonruptured: Secondary | ICD-10-CM

## 2022-11-02 HISTORY — PX: IR RADIOLOGIST EVAL & MGMT: IMG5224

## 2022-12-29 ENCOUNTER — Telehealth: Payer: Self-pay | Admitting: Neurology

## 2022-12-29 DIAGNOSIS — I671 Cerebral aneurysm, nonruptured: Secondary | ICD-10-CM

## 2022-12-29 DIAGNOSIS — G40209 Localization-related (focal) (partial) symptomatic epilepsy and epileptic syndromes with complex partial seizures, not intractable, without status epilepticus: Secondary | ICD-10-CM

## 2022-12-29 MED ORDER — BUTALBITAL-APAP-CAFFEINE 50-325-40 MG PO TABS: 1.0000 | ORAL_TABLET | Freq: Four times a day (QID) | ORAL | 3 refills | Status: AC | PRN

## 2022-12-29 NOTE — Telephone Encounter (Signed)
Pt states she has been having a bad headache since Saturday, she has taken Tylenol but it's not helping much. She is concerned and is requesting a call back

## 2022-12-29 NOTE — Telephone Encounter (Signed)
  butalbital-acetaminophen-caffeine (FIORICET) 50-325-40 MG tablet 1 tablet, Every 6 hours PRN 3 ordered       Summary: Take 1 tablet by mouth every 6 (six) hours as needed for headache., Starting Mon 12/29/2022, Normal Dose, Route, Frequency: 1 tablet, Oral, Every 6 hours PRNStart: 12/16/2024Ord/Sold: 12/29/2022 (O)Ordered On: 12/16/2024Pharmacy: [id number] - [place], [place] - [address]     Ok, Fioricet as needed for headache

## 2022-12-29 NOTE — Telephone Encounter (Signed)
Returned call to patient and left detailed message about Fioricet sent to pharmacy. Advised to call next day with any questions.

## 2022-12-29 NOTE — Telephone Encounter (Signed)
Call to patient who states she has had a throbbing headache since Saturday and took tylenol, and the headache was better Sunday and headache is gone today. She is wanting to know what to do moving what to do in the event pf severe headache. She has hx of stroke and seizures. She denies sz or stroke like symptoms. She is compliant with sz medications. I advised the patient I will send to sarah for review for medication for headaches however, I reviewed stroke like symptoms and advised if she experiences stroke like symptoms she needs to go to the ED immediately or call 911. For unrelieved pain due to migraines she should reach out to PCP or urgent care if unbearable. Patient verbalized understanding.

## 2022-12-30 ENCOUNTER — Telehealth (HOSPITAL_COMMUNITY): Payer: Self-pay | Admitting: Student

## 2022-12-30 NOTE — Telephone Encounter (Signed)
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 Her most recent MRA of the brain performed on 09/08/2022 continues to demonstrate persistence of an approximately 4.5 mm saccular aneurysm in the left posterior communicating artery region. Dr. Corliss Skains last followed up with the patient 10/31/22 where he discussed the findings of the MRA and and explained that there had been no significant change in the overall size or configuration of the aneurysm since the first endovascular procedure. He discussed the option of endovascular treatment versus conservative management with a follow up MRA of the brain in 6 months. The patient expressed her desire to wait until the results of the next MRA of the brain in six-month's time.   The patient called IR today with complaints of intermittent headaches of moderate severity which started Sunday. She called her Neurologist and it was recommended that she call IR. Patient denies any other symptoms. Her case was discussed with Dr. Corliss Skains who made the same recommendations from their appointment 10/31/22. I discussed making an appointment for the patient to meet with Dr. Corliss Skains to discuss re-treating the aneurysm or to wait for the next follow up imaging.   The patient elected to just wait and see if the headaches go away. I discussed signs and symptoms that would warrant immediate medical attention including visual disturbances, a severe headache that doesn't go away, difficulty speaking, walking or weakness in her extremities.   The patient stated she would call our schedulers if she wishes to meet with Dr. Corliss Skains to discuss endovascular treatment.   Alwyn Ren, AGACNP-BC 12/30/2022, 2:14 PM

## 2023-03-05 ENCOUNTER — Other Ambulatory Visit (HOSPITAL_COMMUNITY): Payer: Self-pay | Admitting: Interventional Radiology

## 2023-03-05 ENCOUNTER — Telehealth (HOSPITAL_COMMUNITY): Payer: Self-pay

## 2023-03-05 DIAGNOSIS — I671 Cerebral aneurysm, nonruptured: Secondary | ICD-10-CM

## 2023-03-05 NOTE — Telephone Encounter (Signed)
 Called to schedule mra, no answer, left vm. AB

## 2023-03-16 ENCOUNTER — Ambulatory Visit (HOSPITAL_COMMUNITY): Admission: RE | Admit: 2023-03-16 | Source: Ambulatory Visit

## 2023-03-16 ENCOUNTER — Ambulatory Visit (HOSPITAL_COMMUNITY)
Admission: RE | Admit: 2023-03-16 | Discharge: 2023-03-16 | Disposition: A | Discharge status: Home / Self Care | Source: Ambulatory Visit | Attending: Interventional Radiology | Admitting: Interventional Radiology

## 2023-03-16 DIAGNOSIS — I671 Cerebral aneurysm, nonruptured: Secondary | ICD-10-CM | POA: Insufficient documentation

## 2023-03-19 ENCOUNTER — Encounter: Payer: Self-pay | Admitting: Neurology

## 2023-03-25 ENCOUNTER — Ambulatory Visit: Admitting: Neurology

## 2023-03-26 ENCOUNTER — Other Ambulatory Visit (HOSPITAL_COMMUNITY): Payer: Self-pay | Admitting: Interventional Radiology

## 2023-03-26 DIAGNOSIS — I671 Cerebral aneurysm, nonruptured: Secondary | ICD-10-CM

## 2023-03-30 ENCOUNTER — Telehealth (HOSPITAL_COMMUNITY): Payer: Self-pay

## 2023-03-30 NOTE — Telephone Encounter (Signed)
-----   Message from [facility] sent at 03/25/2023  3:32 PM EDT -----  ----- Message ----- From: Mickie Kay, NP Sent: 03/25/2023   3:14 PM EDT To: Shirlyn Goltz  Dr. Corliss Skains would like to see this patient for an evaluation to discuss proceeding with treatment or just following with imaging.  Thanks, Asher Muir ----- Message ----- From: Alene Mires, NP Sent: 03/24/2023   5:49 PM EDT To: Mickie Kay, NP  Asher Muir, do you mind looking into this for me. D did not mention treatment when we reviewed the MRA today. Thanks ----- Message ----- From: Shirlyn Goltz Sent: 03/24/2023   2:12 PM EDT To: Alene Mires, NP; Sharee Pimple  I thought she was going to be treated??? ----- Message ----- From: Alene Mires, NP Sent: 03/24/2023   1:57 PM EDT To: Shirlyn Goltz; Sharee Pimple  D reviewed this patient.. He would like a repeat MRA in 6 month.

## 2023-04-10 ENCOUNTER — Ambulatory Visit (HOSPITAL_COMMUNITY)
Admission: EM | Admit: 2023-04-10 | Discharge: 2023-04-10 | Disposition: A | Discharge status: Home / Self Care | Attending: Emergency Medicine | Admitting: Emergency Medicine

## 2023-04-10 ENCOUNTER — Encounter (HOSPITAL_COMMUNITY): Payer: Self-pay | Admitting: *Deleted

## 2023-04-10 DIAGNOSIS — H60392 Other infective otitis externa, left ear: Secondary | ICD-10-CM | POA: Diagnosis not present

## 2023-04-10 MED ORDER — CIPROFLOXACIN-DEXAMETHASONE 0.3-0.1 % OT SUSP: 4.0000 [drp] | Freq: Two times a day (BID) | OTIC | 0 refills | Status: DC

## 2023-04-10 NOTE — Discharge Instructions (Signed)
 Use Ciprodex eardrops twice daily for 7 days for infection coverage of the ear canal. You can continue taking Tylenol as needed for pain. Avoid sticking anything in your ears as this can cause further irritation. Return here if symptoms persist.

## 2023-04-10 NOTE — ED Triage Notes (Signed)
 Pt states her right ear is hurting X 3-4 days. She has tried cleaning her ears out, taking tylenol as needed.

## 2023-04-10 NOTE — ED Provider Notes (Signed)
 [facility]    CSN: [id number] Arrival date & time: 04/10/23  1728      History   Chief Complaint Chief Complaint  Patient presents with   Otalgia    HPI Tammy Rodgers is a 63 y.o. female.   Patient presents with right ear pain x 3 to 4 days.  Patient states that she attempted to clean out her ears a few days ago and has had some pain since then.  Patient states that she has been taking Tylenol with some relief.  Denies cough, congestion, fever, headache, and difficulty hearing.   Otalgia   Past Medical History:  Diagnosis Date   Anemia    Anxiety    Complex partial seizure disorder (HCC)    last seizure 09/2012   GERD (gastroesophageal reflux disease)    High cholesterol    Hypertension    states BP is up and down; has been on med. x 2 mos.   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   Pre-diabetes    Seizures (HCC)    Sleep apnea    uses CPAP nightly    Patient Active Problem List   Diagnosis Date Noted   Encounter for annual routine gynecological examination 10/30/2020   Hx of hysterectomy 10/30/2020   Vasomotor symptoms due to menopause 10/30/2020   Nonintractable headache 10/17/2020   Tremor 02/09/2020   Gait abnormality 05/10/2019   Partial symptomatic epilepsy with complex partial seizures, not intractable, without status epilepticus (HCC) 05/10/2019   Brain aneurysm 05/09/2019   Bilateral carpal tunnel syndrome 04/28/2018   Paresthesia 01/28/2017   Encounter for therapeutic drug monitoring 04/17/2016   Falls 12/10/2015   Acute confusional state 12/10/2015   Weakness 12/10/2015   Seizure disorder, complex partial (HCC) 01/15/2014   Hypersomnia, persistent 03/06/2013   Obstructive sleep apnea 11/14/2012   Bloody discharge from left nipple 09/27/2012    Past Surgical History:  Procedure Laterality Date   ABDOMINAL HYSTERECTOMY  12/13/2002   partial   BREAST LUMPECTOMY WITH NEEDLE LOCALIZATION Left 11/24/2012   Procedure: LEFT BREAST  LUMPECTOMY WITH NEEDLE LOCALIZATION;  Surgeon: Shelly Rubenstein, MD;  Location: [facility];  Service: General;  Laterality: Left;   CHOLECYSTECTOMY     COLONOSCOPY     IR 3D INDEPENDENT WKST  07/11/2019   IR ANGIO INTRA EXTRACRAN SEL COM CAROTID INNOMINATE UNI R MOD SED  07/06/2019   IR ANGIO INTRA EXTRACRAN SEL INTERNAL CAROTID UNI L MOD SED  07/06/2019   IR ANGIO VERTEBRAL SEL VERTEBRAL UNI L MOD SED  07/06/2019   IR ANGIOGRAM FOLLOW UP STUDY  07/06/2019   IR RADIOLOGIST EVAL & MGMT  12/29/2019   IR RADIOLOGIST EVAL & MGMT  07/02/2020   IR RADIOLOGIST EVAL & MGMT  11/02/2022   IR TRANSCATH/EMBOLIZ  07/06/2019   IR US GUIDE VASC ACCESS RIGHT  07/06/2019   PARATHYROIDECTOMY     partial   RADIOLOGY WITH ANESTHESIA N/A 07/06/2019   Procedure: IR WITH ANESTHESIA EMBLOLIZATION;  Surgeon: Julieanne Cotton, MD;  Location: [facility];  Service: Radiology;  Laterality: N/A;    OB History     Gravida  2   Para  2   Term  2   Preterm      AB      Living  2      SAB      IAB      Ectopic      Multiple      Live Births  2  Home Medications    Prior to Admission medications   Medication Sig Start Date End Date Taking? Authorizing Provider  amLODipine (NORVASC) 10 MG tablet Take 10 mg by mouth daily.    Yes [provider]  aspirin EC 81 MG tablet Take 81 mg by mouth daily.   Yes [provider]  busPIRone (BUSPAR) 10 MG tablet Take 10 mg by mouth 3 (three) times daily.   Yes [provider]  butalbital-acetaminophen-caffeine (FIORICET) 50-325-40 MG tablet Take 1 tablet by mouth every 6 (six) hours as needed for headache. 12/29/22  Yes Levert Feinstein, MD  ciprofloxacin-dexamethasone (CIPRODEX) OTIC suspension Place 4 drops into the left ear 2 (two) times daily for 7 days. 04/10/23 04/17/23 Yes Wynonia Lawman A, NP  ezetimibe (ZETIA) 10 MG tablet  11/06/21  Yes [provider]  LAMICTAL 200 MG tablet TAKE 1 TABLET IN THE  MORNING AND 1.5 TABLETS IN THE EVENING 08/12/22  Yes Glean Salvo, NP  losartan-hydrochlorothiazide (HYZAAR) 50-12.5 MG tablet Take 1 tablet by mouth daily.    Yes [provider]  Multiple Vitamins-Minerals (MULTIVITAMIN WITH MINERALS) tablet Take 1 tablet by mouth daily.   Yes [provider]  simvastatin (ZOCOR) 20 MG tablet  11/06/21  Yes [provider]  omeprazole (PRILOSEC) 20 MG capsule Take 20 mg by mouth daily.    [provider]    Family History Family History  Problem Relation Age of Onset   Stroke Mother    Heart attack Mother    Diabetes Mother    COPD Father    Heart disease Other     Social History Social History   Tobacco Use   Smoking status: Never   Smokeless tobacco: Never  Vaping Use   Vaping status: Never Used  Substance Use Topics   Alcohol use: No   Drug use: No     Allergies   Iodinated contrast media and Ritalin [methylphenidate hcl]   Review of Systems Review of Systems  HENT:  Positive for ear pain.    Per HPI  Physical Exam Triage Vital Signs ED Triage Vitals  Encounter Vitals Group     BP 04/10/23 1810 128/62     Systolic BP Percentile --      Diastolic BP Percentile --      Pulse Rate 04/10/23 1810 61     Resp 04/10/23 1810 18     Temp 04/10/23 1810 98.5 F (36.9 C)     Temp Source 04/10/23 1810 Oral     SpO2 04/10/23 1810 96 %     Weight --      Height --      Head Circumference --      Peak Flow --      Pain Score 04/10/23 1807 6     Pain Loc --      Pain Education --      Exclude from Growth Chart --    No data found.  Updated Vital Signs BP 128/62 (BP Location: Left Arm)   Pulse 61   Temp 98.5 F (36.9 C) (Oral)   Resp 18   SpO2 96%   Visual Acuity Right Eye Distance:   Left Eye Distance:   Bilateral Distance:    Right Eye Near:   Left Eye Near:    Bilateral Near:     Physical Exam Vitals and nursing note reviewed.  Constitutional:      General: She is awake.  She is not in acute distress.  Appearance: Normal appearance. She is well-developed and well-groomed. She is not ill-appearing.  HENT:     Right Ear: Swelling and tenderness present.     Left Ear: Tympanic membrane, ear canal and external ear normal.  Neurological:     Mental Status: She is alert.  Psychiatric:        Behavior: Behavior is cooperative.      UC Treatments / Results  Labs (all labs ordered are listed, but only abnormal results are displayed) Labs Reviewed - No data to display  EKG   Radiology No results found.  Procedures Procedures (including critical care time)  Medications Ordered in UC Medications - No data to display  Initial Impression / Assessment and Plan / UC Course  I have reviewed the triage vital signs and the nursing notes.  Pertinent labs & imaging results that were available during my care of the patient were reviewed by me and considered in my medical decision making (see chart for details).     Upon assessment mild swelling, erythema, and tenderness noted to right ear canal.  Prescribed Ciprodex for otitis externa coverage.  Recommended continuing Tylenol as needed for pain.  Discussed return precautions. Final Clinical Impressions(s) / UC Diagnoses   Final diagnoses:  Other infective acute otitis externa of left ear     Discharge Instructions      Use Ciprodex eardrops twice daily for 7 days for infection coverage of the ear canal. You can continue taking Tylenol as needed for pain. Avoid sticking anything in your ears as this can cause further irritation. Return here if symptoms persist.   ED Prescriptions     Medication Sig Dispense Auth. Provider   ciprofloxacin-dexamethasone (CIPRODEX) OTIC suspension Place 4 drops into the left ear 2 (two) times daily for 7 days. 7.5 mL Wynonia Lawman A, NP      PDMP not reviewed this encounter.   Wynonia Lawman A, NP 04/10/23 1919

## 2023-04-12 ENCOUNTER — Ambulatory Visit (HOSPITAL_COMMUNITY)
Admission: EM | Admit: 2023-04-12 | Discharge: 2023-04-12 | Disposition: A | Discharge status: Home / Self Care | Attending: Family Medicine | Admitting: Family Medicine

## 2023-04-12 ENCOUNTER — Encounter (HOSPITAL_COMMUNITY): Payer: Self-pay

## 2023-04-12 DIAGNOSIS — R0789 Other chest pain: Secondary | ICD-10-CM

## 2023-04-12 DIAGNOSIS — H9201 Otalgia, right ear: Secondary | ICD-10-CM

## 2023-04-12 DIAGNOSIS — B029 Zoster without complications: Secondary | ICD-10-CM | POA: Diagnosis not present

## 2023-04-12 MED ORDER — PREDNISONE 20 MG PO TABS: ORAL_TABLET | ORAL | 0 refills | Status: DC

## 2023-04-12 MED ORDER — GABAPENTIN 100 MG PO CAPS: 100.0000 mg | ORAL_CAPSULE | Freq: Three times a day (TID) | ORAL | 0 refills | Status: DC | PRN

## 2023-04-12 MED ORDER — VALACYCLOVIR HCL 1 G PO TABS: 1000.0000 mg | ORAL_TABLET | Freq: Three times a day (TID) | ORAL | 0 refills | Status: DC

## 2023-04-12 NOTE — ED Provider Notes (Signed)
 [facility]    CSN: [id number] Arrival date & time: 04/12/23  1004      History   Chief Complaint Chief Complaint  Patient presents with   Otalgia   Rash    HPI Tammy Rodgers is a 63 y.o. female.   Patient was seen on 04/10/23 with complaint of right ear pain.  She was found to have a right otitis externa and put on Ciprodex Otic Drops.  She continues with right ear pain and no relief with use of Ciprodex.  Patient also noticed a rash that came up on her right shoulder and chest late on 04/10/2023.  This rash now hurts and the rash started red but it is now blisters it is from her right upper back over her shoulder onto her right anterior chest.   Otalgia Associated symptoms: rash   Associated symptoms: no abdominal pain, no cough, no diarrhea, no fever, no sore throat and no vomiting   Rash Associated symptoms: no abdominal pain, no diarrhea, no fever, no joint pain, no nausea, no shortness of breath, no sore throat and not vomiting     Past Medical History:  Diagnosis Date   Anemia    Anxiety    Complex partial seizure disorder (HCC)    last seizure 09/2012   GERD (gastroesophageal reflux disease)    High cholesterol    Hypertension    states BP is up and down; has been on med. x 2 mos.   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   Pre-diabetes    Seizures (HCC)    Sleep apnea    uses CPAP nightly    Patient Active Problem List   Diagnosis Date Noted   Encounter for annual routine gynecological examination 10/30/2020   Hx of hysterectomy 10/30/2020   Vasomotor symptoms due to menopause 10/30/2020   Nonintractable headache 10/17/2020   Tremor 02/09/2020   Gait abnormality 05/10/2019   Partial symptomatic epilepsy with complex partial seizures, not intractable, without status epilepticus (HCC) 05/10/2019   Brain aneurysm 05/09/2019   Bilateral carpal tunnel syndrome 04/28/2018   Paresthesia 01/28/2017   Encounter for therapeutic drug monitoring 04/17/2016    Falls 12/10/2015   Acute confusional state 12/10/2015   Weakness 12/10/2015   Seizure disorder, complex partial (HCC) 01/15/2014   Hypersomnia, persistent 03/06/2013   Obstructive sleep apnea 11/14/2012   Bloody discharge from left nipple 09/27/2012    Past Surgical History:  Procedure Laterality Date   ABDOMINAL HYSTERECTOMY  12/13/2002   partial   BREAST LUMPECTOMY WITH NEEDLE LOCALIZATION Left 11/24/2012   Procedure: LEFT BREAST LUMPECTOMY WITH NEEDLE LOCALIZATION;  Surgeon: Shelly Rubenstein, MD;  Location: [facility];  Service: General;  Laterality: Left;   CHOLECYSTECTOMY     COLONOSCOPY     IR 3D INDEPENDENT WKST  07/11/2019   IR ANGIO INTRA EXTRACRAN SEL COM CAROTID INNOMINATE UNI R MOD SED  07/06/2019   IR ANGIO INTRA EXTRACRAN SEL INTERNAL CAROTID UNI L MOD SED  07/06/2019   IR ANGIO VERTEBRAL SEL VERTEBRAL UNI L MOD SED  07/06/2019   IR ANGIOGRAM FOLLOW UP STUDY  07/06/2019   IR RADIOLOGIST EVAL & MGMT  12/29/2019   IR RADIOLOGIST EVAL & MGMT  07/02/2020   IR RADIOLOGIST EVAL & MGMT  11/02/2022   IR TRANSCATH/EMBOLIZ  07/06/2019   IR US GUIDE VASC ACCESS RIGHT  07/06/2019   PARATHYROIDECTOMY     partial   RADIOLOGY WITH ANESTHESIA N/A 07/06/2019   Procedure: IR WITH  ANESTHESIA EMBLOLIZATION;  Surgeon: Julieanne Cotton, MD;  Location: [facility];  Service: Radiology;  Laterality: N/A;    OB History     Gravida  2   Para  2   Term  2   Preterm      AB      Living  2      SAB      IAB      Ectopic      Multiple      Live Births  2            Home Medications    Prior to Admission medications   Medication Sig Start Date End Date Taking? Authorizing Provider  gabapentin (NEURONTIN) 100 MG capsule Take 1 capsule (100 mg total) by mouth every 8 (eight) hours as needed for up to 10 days. 04/12/23 04/22/23 Yes Prescilla Sours, FNP  omeprazole (PRILOSEC) 20 MG capsule Take 20 mg by mouth daily.   Yes [provider]  predniSONE  (DELTASONE) 20 MG tablet Take number 2 pills daily for 3 days then take number 1 pill daily for 3 days. 04/12/23  Yes Prescilla Sours, FNP  valACYclovir (VALTREX) 1000 MG tablet Take 1 tablet (1,000 mg total) by mouth 3 (three) times daily. 04/12/23  Yes Prescilla Sours, FNP  amLODipine (NORVASC) 10 MG tablet Take 10 mg by mouth daily.     [provider]  aspirin EC 81 MG tablet Take 81 mg by mouth daily.    [provider]  busPIRone (BUSPAR) 10 MG tablet Take 10 mg by mouth 3 (three) times daily.    [provider]  butalbital-acetaminophen-caffeine (FIORICET) [phone] MG tablet Take 1 tablet by mouth every 6 (six) hours as needed for headache. 12/29/22   Levert Feinstein, MD  ezetimibe (ZETIA) 10 MG tablet  11/06/21   [provider]  LAMICTAL 200 MG tablet TAKE 1 TABLET IN THE MORNING AND 1.5 TABLETS IN THE EVENING 08/12/22   Glean Salvo, NP  losartan-hydrochlorothiazide (HYZAAR) 50-12.5 MG tablet Take 1 tablet by mouth daily.     [provider]  Multiple Vitamins-Minerals (MULTIVITAMIN WITH MINERALS) tablet Take 1 tablet by mouth daily.    [provider]  simvastatin (ZOCOR) 20 MG tablet  11/06/21   [provider]    Family History Family History  Problem Relation Age of Onset   Stroke Mother    Heart attack Mother    Diabetes Mother    COPD Father    Heart disease Other     Social History Social History   Tobacco Use   Smoking status: Never   Smokeless tobacco: Never  Vaping Use   Vaping status: Never Used  Substance Use Topics   Alcohol use: No   Drug use: No     Allergies   Iodinated contrast media and Ritalin [methylphenidate hcl]   Review of Systems Review of Systems  Constitutional:  Negative for chills and fever.  HENT:  Positive for ear pain. Negative for sore throat.   Eyes:  Negative for pain and visual disturbance.  Respiratory:  Negative for cough and shortness of breath.   Cardiovascular:   Negative for chest pain and palpitations.  Gastrointestinal:  Negative for abdominal pain, constipation, diarrhea, nausea and vomiting.  Genitourinary:  Negative for dysuria and hematuria.  Musculoskeletal:  Negative for arthralgias and back pain.  Skin:  Positive for rash. Negative for color change.  Neurological:  Negative for seizures and syncope.  All other  systems reviewed and are negative.    Physical Exam Triage Vital Signs ED Triage Vitals  Encounter Vitals Group     BP 04/12/23 1022 135/72     Systolic BP Percentile --      Diastolic BP Percentile --      Pulse Rate 04/12/23 1022 66     Resp 04/12/23 1022 16     Temp 04/12/23 1022 98.2 F (36.8 C)     Temp Source 04/12/23 1022 Oral     SpO2 04/12/23 1022 97 %     Weight 04/12/23 1022 165 lb (74.8 kg)     Height 04/12/23 1022 5\' 3"  (1.6 m)     Head Circumference --      Peak Flow --      Pain Score 04/12/23 1023 8     Pain Loc --      Pain Education --      Exclude from Growth Chart --    No data found.  Updated Vital Signs BP 135/72 (BP Location: Left Arm)   Pulse 66   Temp 98.2 F (36.8 C) (Oral)   Resp 16   Ht 5\' 3"  (1.6 m)   Wt 165 lb (74.8 kg)   SpO2 97%   BMI 29.23 kg/m   Visual Acuity Right Eye Distance:   Left Eye Distance:   Bilateral Distance:    Right Eye Near:   Left Eye Near:    Bilateral Near:     Physical Exam Vitals and nursing note reviewed.  Constitutional:      General: She is not in acute distress.    Appearance: She is well-developed. She is not ill-appearing or toxic-appearing.  HENT:     Head: Normocephalic and atraumatic.     Right Ear: Hearing, tympanic membrane, ear canal and external ear normal.     Left Ear: Hearing, tympanic membrane, ear canal and external ear normal.     Nose: No congestion or rhinorrhea.     Right Sinus: No maxillary sinus tenderness or frontal sinus tenderness.     Left Sinus: No maxillary sinus tenderness or frontal sinus tenderness.      Mouth/Throat:     Lips: Pink.     Mouth: Mucous membranes are moist.     Pharynx: Uvula midline. No oropharyngeal exudate or posterior oropharyngeal erythema.     Tonsils: No tonsillar exudate.  Eyes:     Conjunctiva/sclera: Conjunctivae normal.     Pupils: Pupils are equal, round, and reactive to light.  Cardiovascular:     Rate and Rhythm: Normal rate and regular rhythm.     Heart sounds: S1 normal and S2 normal. No murmur heard. Pulmonary:     Effort: Pulmonary effort is normal. No respiratory distress.     Breath sounds: Normal breath sounds. No decreased breath sounds, wheezing, rhonchi or rales.  Abdominal:     General: Bowel sounds are normal.     Palpations: Abdomen is soft.     Tenderness: There is no abdominal tenderness.  Musculoskeletal:        General: No swelling.     Cervical back: Neck supple.  Lymphadenopathy:     Head:     Right side of head: Submental, submandibular, tonsillar, preauricular and posterior auricular adenopathy present.     Left side of head: No submental, submandibular, tonsillar, preauricular or posterior auricular adenopathy.     Cervical: Cervical adenopathy present.     Right cervical: Superficial cervical adenopathy present.  Left cervical: Superficial cervical adenopathy present.  Skin:    General: Skin is warm and dry.     Capillary Refill: Capillary refill takes less than 2 seconds.     Findings: Rash (Erythema and vesicular rash on right upper back from spine to shoulder over the shoulder onto the right anterior chest from sternum to axilla.) present.  Neurological:     Mental Status: She is alert and oriented to person, place, and time.  Psychiatric:        Mood and Affect: Mood normal.      UC Treatments / Results  Labs (all labs ordered are listed, but only abnormal results are displayed) Labs Reviewed - No data to display  EKG   Radiology No results found.  Procedures Procedures (including critical care  time)  Medications Ordered in UC Medications - No data to display  Initial Impression / Assessment and Plan / UC Course  I have reviewed the triage vital signs and the nursing notes.  Pertinent labs & imaging results that were available during my care of the patient were reviewed by me and considered in my medical decision making (see chart for details).     Plan of care: Right otitis externa: Resolved.  Stop the Ciprodex. Shingles: Valtrex 1000 mg every 8 hours for 7 days.  Prednisone 20 mg number 2 pills daily for 3 days then number 1 pill daily for 3 days.  Gabapentin, 100 mg, 1 pill every 8 hours if needed for pain.  Work excuse provided.  Follow-up as needed. Final Clinical Impressions(s) / UC Diagnoses   Final diagnoses:  Herpes zoster without complication  Right-sided chest wall pain  Right ear pain     Discharge Instructions      Right ear is completely normal.  Stop the Ciprodex drops.  Advised to be out of school for a week due to her risk of being contagious for chickenpox.  Provided handouts about shingles.  Valacyclovir, 1000 mg take 1 pill every 8 hours for 7 days.  Prednisone, 20 mg take 2 pills daily for 3 days then take 1 pill daily for 3 days.  Gabapentin, 100 mg, take 1 pill every 8 hours if needed for pain.  Advised to speak to medical providers about her shingles.  She has testing planned this week and a surgery in a week.  That may need to be postponed due to her acute shingles.  Encouraged to speak to her family doctor about shingles vaccine in the future but that should be 3 to 6 months from now.     ED Prescriptions     Medication Sig Dispense Auth. Provider   valACYclovir (VALTREX) 1000 MG tablet Take 1 tablet (1,000 mg total) by mouth 3 (three) times daily. 21 tablet Prescilla Sours, FNP   predniSONE (DELTASONE) 20 MG tablet Take number 2 pills daily for 3 days then take number 1 pill daily for 3 days. 9 tablet Prescilla Sours, FNP   gabapentin  (NEURONTIN) 100 MG capsule Take 1 capsule (100 mg total) by mouth every 8 (eight) hours as needed for up to 10 days. 30 capsule Prescilla Sours, FNP      PDMP not reviewed this encounter.   Prescilla Sours, FNP 04/12/23 [phone]

## 2023-04-12 NOTE — Discharge Instructions (Signed)
 Right ear is completely normal.  Stop the Ciprodex drops.  Advised to be out of school for a week due to her risk of being contagious for chickenpox.  Provided handouts about shingles.  Valacyclovir, 1000 mg take 1 pill every 8 hours for 7 days.  Prednisone, 20 mg take 2 pills daily for 3 days then take 1 pill daily for 3 days.  Gabapentin, 100 mg, take 1 pill every 8 hours if needed for pain.  Advised to speak to medical providers about her shingles.  She has testing planned this week and a surgery in a week.  That may need to be postponed due to her acute shingles.  Encouraged to speak to her family doctor about shingles vaccine in the future but that should be 3 to 6 months from now.

## 2023-04-12 NOTE — ED Triage Notes (Signed)
 Patient here today with c/o right ear pain X 1 week that has been worsening since Friday evening. Patient has been using the ear drops that has been prescribed with no relief.   Patient also noticed a rash that came up on her right shoulder and chest this weekend.

## 2023-04-13 ENCOUNTER — Telehealth (HOSPITAL_COMMUNITY): Payer: Self-pay | Admitting: Interventional Radiology

## 2023-04-13 ENCOUNTER — Ambulatory Visit (HOSPITAL_COMMUNITY): Admission: RE | Admit: 2023-04-13 | Source: Ambulatory Visit

## 2023-04-13 ENCOUNTER — Encounter (HOSPITAL_COMMUNITY): Payer: Self-pay

## 2023-06-29 ENCOUNTER — Ambulatory Visit (HOSPITAL_COMMUNITY)
Admission: RE | Admit: 2023-06-29 | Discharge: 2023-06-29 | Disposition: A | Discharge status: Home / Self Care | Source: Ambulatory Visit | Attending: Interventional Radiology | Admitting: Interventional Radiology

## 2023-06-29 DIAGNOSIS — I671 Cerebral aneurysm, nonruptured: Secondary | ICD-10-CM

## 2023-06-30 ENCOUNTER — Other Ambulatory Visit: Payer: Self-pay | Admitting: Physician Assistant

## 2023-06-30 DIAGNOSIS — I671 Cerebral aneurysm, nonruptured: Secondary | ICD-10-CM

## 2023-06-30 HISTORY — PX: IR RADIOLOGIST EVAL & MGMT: IMG5224

## 2023-06-30 MED ORDER — PREDNISONE 50 MG PO TABS: ORAL_TABLET | ORAL | 0 refills | Status: AC

## 2023-06-30 MED ORDER — CLOPIDOGREL BISULFATE 75 MG PO TABS: 75.0000 mg | ORAL_TABLET | Freq: Every day | ORAL | 0 refills | Status: DC

## 2023-07-09 ENCOUNTER — Other Ambulatory Visit (HOSPITAL_COMMUNITY)
Admission: RE | Admit: 2023-07-09 | Discharge: 2023-07-09 | Disposition: A | Discharge status: Home / Self Care | Attending: Physician Assistant | Admitting: Physician Assistant

## 2023-07-09 DIAGNOSIS — I671 Cerebral aneurysm, nonruptured: Secondary | ICD-10-CM | POA: Insufficient documentation

## 2023-07-09 LAB — PLATELET INHIBITION P2Y12: Platelet Function  P2Y12: 30 [PRU] — ABNORMAL LOW (ref 182–335)

## 2023-07-10 ENCOUNTER — Encounter (HOSPITAL_COMMUNITY): Payer: Self-pay | Admitting: Interventional Radiology

## 2023-07-10 ENCOUNTER — Other Ambulatory Visit: Payer: Self-pay

## 2023-07-10 NOTE — Progress Notes (Addendum)
 PCP - Velna Skeeter, MD  EKG - DOS  CPAP - Redid sleep study and is no longer needed  Blood Thinner Instructions: Start Plavix  on 06/23 Aspirin  Instructions: Start ASA on 06/23  Anesthesia review: N  Patient verbally denies any shortness of breath, fever, cough and chest pain during phone call   -------------  SDW INSTRUCTIONS given:  Your procedure is scheduled on Tuesday, July 1st.  Report to [facility] Main Entrance A at 0530 A.M., and check in at the Admitting office.  Call this number if you have problems the morning of surgery:  [phone]   Remember:  Do not eat or drink after midnight the night before your surgery    Take these medicines the morning of surgery with A SIP OF WATER  aspirin   busPIRone (BUSPAR) clopidogrel  (PLAVIX )  LAMICTAL   predniSONE  (DELTASONE )  butalbital -acetaminophen -caffeine  (FIORICET)-if needed    As of today, STOP taking any Aspirin  (unless otherwise instructed by your surgeon) Aleve, Naproxen, Ibuprofen, Motrin, Advil, Goody's, BC's, all herbal medications, fish oil, and all vitamins.                      Do not wear jewelry, make up, or nail polish            Do not wear lotions, powders, perfumes/colognes, or deodorant.            Do not shave 48 hours prior to surgery.  Men may shave face and neck.            Do not bring valuables to the hospital.            [facility] is not responsible for any belongings or valuables.  Do NOT Smoke (Tobacco/Vaping) 24 hours prior to your procedure If you use a CPAP at night, you may bring all equipment for your overnight stay.   Contacts, glasses, dentures or bridgework may not be worn into surgery.      For patients admitted to the hospital, discharge time will be determined by your treatment team.   Patients discharged the day of surgery will not be allowed to drive home, and someone needs to stay with them for 24 hours.    Special instructions:   [facility]- Preparing For  Surgery  Before surgery, you can play an important role. Because skin is not sterile, your skin needs to be as free of germs as possible. You can reduce the number of germs on your skin by washing with CHG (chlorahexidine gluconate) Soap before surgery.  CHG is an antiseptic cleaner which kills germs and bonds with the skin to continue killing germs even after washing.    Oral Hygiene is also important to reduce your risk of infection.  Remember - BRUSH YOUR TEETH THE MORNING OF SURGERY WITH YOUR REGULAR TOOTHPASTE  Please do not use if you have an allergy to CHG or antibacterial soaps. If your skin becomes reddened/irritated stop using the CHG.  Do not shave (including legs and underarms) for at least 48 hours prior to first CHG shower. It is OK to shave your face.  Please follow these instructions carefully.   Shower the NIGHT BEFORE SURGERY and the MORNING OF SURGERY with DIAL Soap.   Pat yourself dry with a CLEAN TOWEL.  Wear CLEAN PAJAMAS to bed the night before surgery  Place CLEAN SHEETS on your bed the night of your first shower and DO NOT SLEEP WITH PETS.   Day of Surgery: Please shower  morning of surgery  Wear Clean/Comfortable clothing the morning of surgery Do not apply any deodorants/lotions.   Remember to brush your teeth WITH YOUR REGULAR TOOTHPASTE.   Questions were answered. Patient verbalized understanding of instructions.

## 2023-07-13 ENCOUNTER — Telehealth (HOSPITAL_COMMUNITY): Payer: Self-pay

## 2023-07-13 ENCOUNTER — Other Ambulatory Visit (HOSPITAL_COMMUNITY)

## 2023-07-13 NOTE — Telephone Encounter (Signed)
-----   Message from Tammy Rodgers sent at 07/13/2023  9:54 AM EDT ----- Regarding: RE: p2y12 results Tammy Rodgers,  Per Dev, we are good to proceed tomorrow. Thank you! -M ----- Message ----- From: Tammy Rodgers Sent: 07/13/2023   9:18 AM EDT To: Tammy CHRISTELLA Bal, PA-C Subject: p2y12 results                                  Good Morning!  Tammy Rodgers is scheduled for tomorrow w/Dr. Dolphus. Looks like her p2y12 came back and her result was 30. Not sure if she needs to change anything or not but she called with concerns so wanted to reach out to you to advise please.   Thanks,  Tammy Rodgers

## 2023-07-14 ENCOUNTER — Encounter (HOSPITAL_COMMUNITY): Payer: Self-pay | Admitting: Interventional Radiology

## 2023-07-14 ENCOUNTER — Inpatient Hospital Stay (HOSPITAL_COMMUNITY): Admitting: Anesthesiology

## 2023-07-14 ENCOUNTER — Other Ambulatory Visit: Payer: Self-pay

## 2023-07-14 ENCOUNTER — Inpatient Hospital Stay (HOSPITAL_COMMUNITY)
Admission: RE | Admit: 2023-07-14 | Discharge: 2023-07-15 | DRG: 026 | Disposition: A | Discharge status: Home / Self Care | Attending: Interventional Radiology | Admitting: Interventional Radiology

## 2023-07-14 ENCOUNTER — Inpatient Hospital Stay (HOSPITAL_COMMUNITY)
Admission: RE | Admit: 2023-07-14 | Discharge: 2023-07-14 | Disposition: A | Discharge status: Home / Self Care | Source: Ambulatory Visit | Attending: Interventional Radiology | Admitting: Interventional Radiology

## 2023-07-14 ENCOUNTER — Encounter (HOSPITAL_COMMUNITY)
Admission: RE | Disposition: A | Discharge status: Home / Self Care | Payer: Self-pay | Source: Home / Self Care | Attending: Interventional Radiology

## 2023-07-14 DIAGNOSIS — I671 Cerebral aneurysm, nonruptured: Secondary | ICD-10-CM | POA: Diagnosis present

## 2023-07-14 DIAGNOSIS — Z823 Family history of stroke: Secondary | ICD-10-CM

## 2023-07-14 DIAGNOSIS — Z91041 Radiographic dye allergy status: Secondary | ICD-10-CM

## 2023-07-14 DIAGNOSIS — Z79899 Other long term (current) drug therapy: Secondary | ICD-10-CM

## 2023-07-14 DIAGNOSIS — G4733 Obstructive sleep apnea (adult) (pediatric): Secondary | ICD-10-CM

## 2023-07-14 DIAGNOSIS — Z825 Family history of asthma and other chronic lower respiratory diseases: Secondary | ICD-10-CM | POA: Diagnosis not present

## 2023-07-14 DIAGNOSIS — Z9049 Acquired absence of other specified parts of digestive tract: Secondary | ICD-10-CM | POA: Diagnosis not present

## 2023-07-14 DIAGNOSIS — G40209 Localization-related (focal) (partial) symptomatic epilepsy and epileptic syndromes with complex partial seizures, not intractable, without status epilepticus: Secondary | ICD-10-CM | POA: Diagnosis present

## 2023-07-14 DIAGNOSIS — I10 Essential (primary) hypertension: Secondary | ICD-10-CM | POA: Diagnosis not present

## 2023-07-14 DIAGNOSIS — Z8249 Family history of ischemic heart disease and other diseases of the circulatory system: Secondary | ICD-10-CM

## 2023-07-14 DIAGNOSIS — Z7902 Long term (current) use of antithrombotics/antiplatelets: Secondary | ICD-10-CM

## 2023-07-14 DIAGNOSIS — G4730 Sleep apnea, unspecified: Secondary | ICD-10-CM | POA: Diagnosis present

## 2023-07-14 DIAGNOSIS — Z833 Family history of diabetes mellitus: Secondary | ICD-10-CM | POA: Diagnosis not present

## 2023-07-14 DIAGNOSIS — Z8673 Personal history of transient ischemic attack (TIA), and cerebral infarction without residual deficits: Secondary | ICD-10-CM | POA: Diagnosis not present

## 2023-07-14 DIAGNOSIS — E7800 Pure hypercholesterolemia, unspecified: Secondary | ICD-10-CM | POA: Diagnosis present

## 2023-07-14 DIAGNOSIS — Z9889 Other specified postprocedural states: Secondary | ICD-10-CM

## 2023-07-14 DIAGNOSIS — Z888 Allergy status to other drugs, medicaments and biological substances status: Secondary | ICD-10-CM | POA: Diagnosis not present

## 2023-07-14 DIAGNOSIS — K219 Gastro-esophageal reflux disease without esophagitis: Secondary | ICD-10-CM | POA: Diagnosis present

## 2023-07-14 DIAGNOSIS — Z7982 Long term (current) use of aspirin: Secondary | ICD-10-CM

## 2023-07-14 HISTORY — DX: Cerebral infarction, unspecified: I63.9

## 2023-07-14 HISTORY — PX: IR TRANSCATH/EMBOLIZ: IMG695

## 2023-07-14 HISTORY — PX: RADIOLOGY WITH ANESTHESIA: SHX6223

## 2023-07-14 HISTORY — PX: IR NEURO EACH ADD'L AFTER BASIC UNI LEFT (MS): IMG5373

## 2023-07-14 HISTORY — PX: IR ANGIO INTRA EXTRACRAN SEL INTERNAL CAROTID UNI L MOD SED: IMG5361

## 2023-07-14 HISTORY — PX: IR ANGIO INTRA EXTRACRAN SEL COM CAROTID INNOMINATE UNI R MOD SED: IMG5359

## 2023-07-14 HISTORY — PX: IR ANGIOGRAM FOLLOW UP STUDY: IMG697

## 2023-07-14 HISTORY — PX: IR ANGIO VERTEBRAL SEL SUBCLAVIAN INNOMINATE UNI L MOD SED: IMG5364

## 2023-07-14 HISTORY — PX: IR CT HEAD LTD: IMG2386

## 2023-07-14 LAB — CBC
HCT: 38.6 % (ref 36.0–46.0)
Hemoglobin: 12.8 g/dL (ref 12.0–15.0)
MCH: 30.7 pg (ref 26.0–34.0)
MCHC: 33.2 g/dL (ref 30.0–36.0)
MCV: 92.6 fL (ref 80.0–100.0)
Platelets: 262 10*3/uL (ref 150–400)
RBC: 4.17 MIL/uL (ref 3.87–5.11)
RDW: 13.6 % (ref 11.5–15.5)
WBC: 4.3 10*3/uL (ref 4.0–10.5)
nRBC: 0 % (ref 0.0–0.2)

## 2023-07-14 LAB — BASIC METABOLIC PANEL WITH GFR
Anion gap: 7 (ref 5–15)
BUN: 18 mg/dL (ref 8–23)
CO2: 27 mmol/L (ref 22–32)
Calcium: 10.4 mg/dL — ABNORMAL HIGH (ref 8.9–10.3)
Chloride: 105 mmol/L (ref 98–111)
Creatinine, Ser: 0.78 mg/dL (ref 0.44–1.00)
GFR, Estimated: >60 mL/min (ref >60)
Glucose, Bld: 145 mg/dL — ABNORMAL HIGH (ref 70–99)
Potassium: 3.7 mmol/L (ref 3.5–5.1)
Sodium: 139 mmol/L (ref 135–145)

## 2023-07-14 LAB — TYPE AND SCREEN
ABO/RH(D): O POS
Antibody Screen: NEGATIVE

## 2023-07-14 LAB — MRSA NEXT GEN BY PCR, NASAL: MRSA by PCR Next Gen: NOT DETECTED

## 2023-07-14 LAB — PROTIME-INR
INR: 1 (ref 0.8–1.2)
Prothrombin Time: 13.4 s (ref 11.4–15.2)

## 2023-07-14 LAB — HEMOGLOBIN A1C
Hgb A1c MFr Bld: 5.3 % (ref 4.8–5.6)
Mean Plasma Glucose: 105.41 mg/dL

## 2023-07-14 LAB — GLUCOSE, CAPILLARY
Glucose-Capillary: 126 mg/dL — ABNORMAL HIGH (ref 70–99)
Glucose-Capillary: 142 mg/dL — ABNORMAL HIGH (ref 70–99)

## 2023-07-14 LAB — ABO/RH: ABO/RH(D): O POS

## 2023-07-14 LAB — HEPARIN LEVEL (UNFRACTIONATED): Heparin Unfractionated: 0.44 [IU]/mL (ref 0.30–0.70)

## 2023-07-14 SURGERY — RADIOLOGY WITH ANESTHESIA
Anesthesia: General

## 2023-07-14 MED ORDER — LAMOTRIGINE 100 MG PO TABS
200.0000 mg | ORAL_TABLET | Freq: Two times a day (BID) | ORAL | Status: DC
  Administered 2023-07-14 – 2023-07-15 (×2): 200 mg via ORAL
  Filled 2023-07-14 (×2): qty 2

## 2023-07-14 MED ORDER — LIDOCAINE 2% (20 MG/ML) 5 ML SYRINGE
INTRAMUSCULAR | Status: DC | PRN
  Administered 2023-07-14: 60 mg via INTRAVENOUS

## 2023-07-14 MED ORDER — CLOPIDOGREL BISULFATE 75 MG PO TABS
75.0000 mg | ORAL_TABLET | Freq: Every day | ORAL | Status: DC
  Administered 2023-07-15: 75 mg via ORAL
  Filled 2023-07-14: qty 1

## 2023-07-14 MED ORDER — PHENYLEPHRINE HCL-NACL 20-0.9 MG/250ML-% IV SOLN
INTRAVENOUS | Status: DC | PRN
  Administered 2023-07-14: 40 ug/min via INTRAVENOUS

## 2023-07-14 MED ORDER — CLEVIDIPINE BUTYRATE 0.5 MG/ML IV EMUL: 0.0000 mg/h | INTRAVENOUS | Status: DC

## 2023-07-14 MED ORDER — LIDOCAINE HCL 1 % IJ SOLN: 20.0000 mL | Freq: Once | INTRAMUSCULAR | Status: AC

## 2023-07-14 MED ORDER — CEFAZOLIN SODIUM-DEXTROSE 2-3 GM-%(50ML) IV SOLR
INTRAVENOUS | Status: DC | PRN
  Administered 2023-07-14: 2 g via INTRAVENOUS

## 2023-07-14 MED ORDER — DIPHENHYDRAMINE HCL 50 MG/ML IJ SOLN
INTRAMUSCULAR | Status: AC
  Filled 2023-07-14: qty 1

## 2023-07-14 MED ORDER — MENTHOL 3 MG MT LOZG
1.0000 | LOZENGE | OROMUCOSAL | Status: DC | PRN
  Filled 2023-07-14: qty 9

## 2023-07-14 MED ORDER — CEFAZOLIN SODIUM-DEXTROSE 2-4 GM/100ML-% IV SOLN
INTRAVENOUS | Status: AC
  Filled 2023-07-14: qty 100

## 2023-07-14 MED ORDER — SODIUM CHLORIDE 0.9 % IV SOLN: INTRAVENOUS | Status: DC

## 2023-07-14 MED ORDER — FENTANYL CITRATE (PF) 100 MCG/2ML IJ SOLN
INTRAMUSCULAR | Status: AC
  Filled 2023-07-14: qty 2

## 2023-07-14 MED ORDER — ONDANSETRON HCL 4 MG/2ML IJ SOLN
INTRAMUSCULAR | Status: DC | PRN
  Administered 2023-07-14: 4 mg via INTRAVENOUS

## 2023-07-14 MED ORDER — CLOPIDOGREL BISULFATE 75 MG PO TABS: 75.0000 mg | ORAL_TABLET | Freq: Every day | ORAL | Status: DC

## 2023-07-14 MED ORDER — ACETAMINOPHEN 650 MG RE SUPP: 650.0000 mg | RECTAL | Status: DC | PRN

## 2023-07-14 MED ORDER — MIDAZOLAM HCL 2 MG/2ML IJ SOLN
INTRAMUSCULAR | Status: DC | PRN
  Administered 2023-07-14: 2 mg via INTRAVENOUS

## 2023-07-14 MED ORDER — DIPHENHYDRAMINE HCL 50 MG/ML IJ SOLN
25.0000 mg | Freq: Once | INTRAMUSCULAR | Status: AC
  Administered 2023-07-14: 25 mg via INTRAVENOUS

## 2023-07-14 MED ORDER — ACETAMINOPHEN 160 MG/5ML PO SOLN: 650.0000 mg | ORAL | Status: DC | PRN

## 2023-07-14 MED ORDER — PROPOFOL 10 MG/ML IV BOLUS
INTRAVENOUS | Status: DC | PRN
  Administered 2023-07-14: 30 mg via INTRAVENOUS
  Administered 2023-07-14: 130 mg via INTRAVENOUS

## 2023-07-14 MED ORDER — ROCURONIUM BROMIDE 10 MG/ML (PF) SYRINGE
PREFILLED_SYRINGE | INTRAVENOUS | Status: DC | PRN
  Administered 2023-07-14: 70 mg via INTRAVENOUS

## 2023-07-14 MED ORDER — ACETAMINOPHEN 325 MG PO TABS: 650.0000 mg | ORAL_TABLET | ORAL | Status: DC | PRN

## 2023-07-14 MED ORDER — IOHEXOL 300 MG/ML  SOLN
50.0000 mL | Freq: Once | INTRAMUSCULAR | Status: AC | PRN
  Administered 2023-07-14: 15 mL via INTRA_ARTERIAL

## 2023-07-14 MED ORDER — FENTANYL CITRATE (PF) 250 MCG/5ML IJ SOLN
INTRAMUSCULAR | Status: DC | PRN
  Administered 2023-07-14: 100 ug via INTRAVENOUS

## 2023-07-14 MED ORDER — DROPERIDOL 2.5 MG/ML IJ SOLN: 0.6250 mg | Freq: Once | INTRAMUSCULAR | Status: DC | PRN

## 2023-07-14 MED ORDER — LIDOCAINE HCL 1 % IJ SOLN
INTRAMUSCULAR | Status: AC
  Filled 2023-07-14: qty 20

## 2023-07-14 MED ORDER — SENNA 8.6 MG PO TABS
1.0000 | ORAL_TABLET | Freq: Every day | ORAL | Status: DC | PRN
  Administered 2023-07-14: 8.6 mg via ORAL
  Filled 2023-07-14: qty 1

## 2023-07-14 MED ORDER — PHENYLEPHRINE 80 MCG/ML (10ML) SYRINGE FOR IV PUSH (FOR BLOOD PRESSURE SUPPORT)
PREFILLED_SYRINGE | INTRAVENOUS | Status: DC | PRN
  Administered 2023-07-14: 80 ug via INTRAVENOUS
  Administered 2023-07-14: 160 ug via INTRAVENOUS
  Administered 2023-07-14 (×2): 80 ug via INTRAVENOUS

## 2023-07-14 MED ORDER — LACTATED RINGERS IV SOLN: INTRAVENOUS | Status: DC

## 2023-07-14 MED ORDER — HEPARIN (PORCINE) 25000 UT/250ML-% IV SOLN
600.0000 [IU]/h | INTRAVENOUS | Status: DC
  Administered 2023-07-14: 600 [IU]/h via INTRAVENOUS
  Filled 2023-07-14: qty 250

## 2023-07-14 MED ORDER — LACTATED RINGERS IV SOLN: INTRAVENOUS | Status: DC | PRN

## 2023-07-14 MED ORDER — OXYCODONE HCL 5 MG/5ML PO SOLN: 5.0000 mg | Freq: Once | ORAL | Status: DC | PRN

## 2023-07-14 MED ORDER — ACETAMINOPHEN 10 MG/ML IV SOLN: 1000.0000 mg | Freq: Once | INTRAVENOUS | Status: DC | PRN

## 2023-07-14 MED ORDER — MIDAZOLAM HCL 2 MG/2ML IJ SOLN
INTRAMUSCULAR | Status: AC
  Filled 2023-07-14: qty 2

## 2023-07-14 MED ORDER — FENTANYL CITRATE (PF) 100 MCG/2ML IJ SOLN: 25.0000 ug | INTRAMUSCULAR | Status: DC | PRN

## 2023-07-14 MED ORDER — OXYCODONE HCL 5 MG PO TABS: 5.0000 mg | ORAL_TABLET | Freq: Once | ORAL | Status: DC | PRN

## 2023-07-14 MED ORDER — ASPIRIN 81 MG PO CHEW
81.0000 mg | CHEWABLE_TABLET | Freq: Every day | ORAL | Status: DC
  Administered 2023-07-15: 81 mg via ORAL
  Filled 2023-07-14: qty 1

## 2023-07-14 MED ORDER — HEPARIN SODIUM (PORCINE) 1000 UNIT/ML IJ SOLN
INTRAMUSCULAR | Status: DC | PRN
Start: 2023-07-14 — End: 2023-07-14
  Administered 2023-07-14: 3000 [IU] via INTRAVENOUS

## 2023-07-14 MED ORDER — CHLORHEXIDINE GLUCONATE CLOTH 2 % EX PADS
6.0000 | MEDICATED_PAD | Freq: Every day | CUTANEOUS | Status: DC
  Administered 2023-07-14: 6 via TOPICAL

## 2023-07-14 MED ORDER — ASPIRIN 81 MG PO CHEW: 81.0000 mg | CHEWABLE_TABLET | Freq: Every day | ORAL | Status: DC

## 2023-07-14 MED ORDER — IOHEXOL 300 MG/ML  SOLN
150.0000 mL | Freq: Once | INTRAMUSCULAR | Status: AC | PRN
  Administered 2023-07-14: 100 mL via INTRA_ARTERIAL

## 2023-07-14 MED ORDER — DEXAMETHASONE SODIUM PHOSPHATE 10 MG/ML IJ SOLN
INTRAMUSCULAR | Status: DC | PRN
  Administered 2023-07-14: 5 mg via INTRAVENOUS

## 2023-07-14 MED ORDER — ORAL CARE MOUTH RINSE: 15.0000 mL | OROMUCOSAL | Status: DC | PRN

## 2023-07-14 MED ORDER — SUGAMMADEX SODIUM 200 MG/2ML IV SOLN
INTRAVENOUS | Status: DC | PRN
  Administered 2023-07-14: 200 mg via INTRAVENOUS

## 2023-07-14 MED ORDER — INSULIN ASPART 100 UNIT/ML IJ SOLN
0.0000 [IU] | Freq: Three times a day (TID) | INTRAMUSCULAR | Status: DC
  Administered 2023-07-14: 2 [IU] via SUBCUTANEOUS

## 2023-07-14 MED ORDER — PROTAMINE SULFATE 10 MG/ML IV SOLN
INTRAVENOUS | Status: DC | PRN
  Administered 2023-07-14: 5 mg via INTRAVENOUS

## 2023-07-14 MED ORDER — HEPARIN (PORCINE) 25000 UT/250ML-% IV SOLN: 500.0000 [IU]/h | INTRAVENOUS | Status: DC

## 2023-07-14 NOTE — Progress Notes (Signed)
 Referring Provider(s): Deveshwar,Sanjeev  Supervising Physician: Dolphus Carrion  Patient Status:  [facility] - In-pt  Chief Complaint:  S/p intracranial angiogram and stent placement 07/14/23 Dr. Dolphus   Subjective:  Pt seen lying in bed with many family members at bedside.  Pt states she is having no symptoms including dizziness, headache, numbness or tingling, nausea and vomiting.  All pt and family questions answered.    Allergies: Iodinated contrast media and Ritalin  [methylphenidate  hcl]  Medications: Prior to Admission medications   Medication Sig Start Date End Date Taking? Authorizing Provider  amLODipine  (NORVASC ) 10 MG tablet Take 10 mg by mouth daily.     [provider]  aspirin  EC 81 MG tablet Take 81 mg by mouth daily.    [provider]  busPIRone (BUSPAR) 10 MG tablet Take 10 mg by mouth 2 (two) times daily.    [provider]  butalbital -acetaminophen -caffeine  (FIORICET) 50-325-40 MG tablet Take 1 tablet by mouth every 6 (six) hours as needed for headache. 12/29/22   Onita Duos, MD  clopidogrel  (PLAVIX ) 75 MG tablet Take 1 tablet (75 mg total) by mouth daily. 07/05/23 10/03/23  Almeda Sari Slocumb, PA-C  Collagen-Vitamin C-Biotin (COLLAGEN PO) Take 1 capsule by mouth daily.    [provider]  ezetimibe (ZETIA) 10 MG tablet Take 10 mg by mouth daily. 11/06/21   [provider]  LAMICTAL  200 MG tablet TAKE 1 TABLET IN THE MORNING AND 1.5 TABLETS IN THE EVENING Patient taking differently: Take 200 mg by mouth 2 (two) times daily. 08/12/22   Gayland Lauraine PARAS, NP  losartan -hydrochlorothiazide  (HYZAAR) 50-12.5 MG tablet Take 1 tablet by mouth daily.     [provider]  Multiple Vitamins-Minerals (MULTIVITAMIN WITH MINERALS) tablet Take 1 tablet by mouth daily.    [provider]  omeprazole (PRILOSEC) 20 MG capsule Take 20 mg by mouth 2 (two) times daily before a meal.    [provider]  predniSONE   (DELTASONE ) 20 MG tablet Take number 2 pills daily for 3 days then take number 1 pill daily for 3 days. 04/12/23   Ival Lauraine, FNP  simvastatin (ZOCOR) 20 MG tablet Take 20 mg by mouth daily. 11/06/21   [provider]     Vital Signs: There were no vitals taken for this visit.  Physical Exam Constitutional:      Appearance: Normal appearance.  HENT:     Head: Normocephalic.   Skin:    Comments: Right groin site clean, dry, without redness  No oozing, discharge, or blood noted on dressing  Hemostasis achieved    Neurological:     General: No focal deficit present.     Mental Status: She is alert and oriented to person, place, and time.     Sensory: No sensory deficit.     Motor: No weakness.   Psychiatric:        Behavior: Behavior normal.      Labs:  CBC: Recent Labs    07/14/23 0555  WBC 4.3  HGB 12.8  HCT 38.6  PLT 262    COAGS: Recent Labs    07/14/23 0555  INR 1.0    BMP: Recent Labs    07/14/23 0555  NA 139  K 3.7  CL 105  CO2 27  GLUCOSE 145*  BUN 18  CALCIUM 10.4*  CREATININE 0.78  GFRNONAA >60    LIVER FUNCTION TESTS: No results for input(s): BILITOT, AST, ALT, ALKPHOS, PROT, ALBUMIN in the last 8760 hours.  Assessment and Plan:  Pt seen today in assessment for s/p intracranial angiogram and stent placement 07/14/23 Dr. Dolphus.    Groin site was clean, dry, and dressed appropriately.  No blood, oozing, or discharge was noted on the dressing.  Hemostasis achieved.    No dizziness, numbness, tingling, or headaches present at this time.    All pt questions answered, IR will continue to follow.  Please call IR with any questions.    Electronically Signed: Lavanda JAYSON Jurist, PA-C 07/14/2023, 3:28 PM    I spent a total of 15 Minutes at the the patient's bedside AND on the patient's hospital floor or unit, greater than 50% of which was counseling/coordinating care for S/p intracranial angiogram and stent  placement 07/14/23 Dr. Dolphus

## 2023-07-14 NOTE — Procedures (Signed)
 INR.  Status post bilateral common carotid arteriogram 8 left vertebral angiogram.  Right CFA approach.  Findings.  1.  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  Status post endovascular treatment of the persistent aneurysm with placement of a 4 mm x 14 mm pipeline flex flow diverter device with stasis.  8 French Angio-Seal closure device for the right groin puncture site.  Distal pulses all present unchanged from prior to the procedure.  Post CT brain demonstrated no evidence of intracranial hemorrhage.  Patient extubated.  Denies any headaches, nausea  or vomiting.  Pupils 2 to 3 mm equal bilaterally sluggishly reactive.  Movements symmetrical bilaterally.  No facial asymmetry.  Tongue midline. Moves all 4s equally spontaneously and to command.  GORMAN Nash MD.

## 2023-07-14 NOTE — Progress Notes (Signed)
 Patient ID: Tammy Rodgers, female   DOB: 1960-04-21, 63 y.o.   MRN: [id number] INR.  Patient seen and examined.  Patient reports no  recent neurological symptoms of severe headaches ,nausea, vomiting, visual aberrations,or  of right-sided numbness weakness tingling or incoordination.  Denies any recent chest pains shortness of breath or palpitations.  Reports her breathing to be normal.  No recent abdominal pain constipation, diarrhea or melena.   No urinary symptoms.  No recent chills, fever or rigors.  Examination nonfocal neurologically.  Patient aware of the procedure of retreatment of the persistent left posterior communicating artery aneurysm previously treated with a single flow diverter.  Risks discussed ,though not limited to ,of thromboembolic stroke,worsening neuro deficit,death ,remote risk of intracranial hemorrhage,contrast induced renal failure,vessel injury at access site, benefits, alternatives have been discussed.   The patient has expressed understanding, and her desire to proceed with retreatment as previously discussed.  GORMAN Nash MD.  MD.

## 2023-07-14 NOTE — Sedation Documentation (Signed)
ACT=233

## 2023-07-14 NOTE — Sedation Documentation (Signed)
 ACT @ 245

## 2023-07-14 NOTE — Sedation Documentation (Signed)
 ACT 210

## 2023-07-14 NOTE — Transfer of Care (Signed)
 Immediate Anesthesia Transfer of Care Note  Patient: Tammy Rodgers  Procedure(s) Performed: RADIOLOGY WITH ANESTHESIA  Patient Location: PACU  Anesthesia Type:General  Level of Consciousness: awake and alert   Airway & Oxygen Therapy: Patient Spontanous Breathing and Patient connected to face mask oxygen  Post-op Assessment: Report given to RN, Post -op Vital signs reviewed and stable, Patient moving all extremities, and Patient able to stick tongue midline  Post vital signs: Reviewed and stable  Last Vitals:  Vitals Value Taken Time  BP 136/74 07/14/23 12:15  Temp 36.4 C 07/14/23 12:10  Pulse 77 07/14/23 12:19  Resp 17 07/14/23 12:19  SpO2 99 % 07/14/23 12:19  Vitals shown include unfiled device data.  Last Pain:  Vitals:   07/14/23 0617  TempSrc:   PainSc: 4       Patients Stated Pain Goal: 0 (07/14/23 0617)  Complications: No notable events documented.

## 2023-07-14 NOTE — Sedation Documentation (Signed)
 Patient moved to table by staff , secured, hooked to monitors, and now under the care of anesthesia. Please see charting in vitals per CRNA.

## 2023-07-14 NOTE — Progress Notes (Signed)
 PHARMACY - ANTICOAGULATION CONSULT NOTE  Pharmacy Consult:  Heparin  Indication: Post NeuroIR  Allergies  Allergen Reactions   Iodinated Contrast Media Hypertension   Ritalin  [Methylphenidate  Hcl]     Seizure    Patient Measurements: Height: 5' 3 (160 cm) Weight: 72.6 kg (160 lb) IBW/kg (Calculated) : 52.4 HEPARIN  DW (KG): 67.6  Vital Signs: Temp: 97.6 F (36.4 C) (07/01 1245) Temp Source: Oral (07/01 0547) BP: 130/85 (07/01 1300) Pulse Rate: 78 (07/01 1300)  Labs: Recent Labs    07/14/23 0555  HGB 12.8  HCT 38.6  PLT 262  LABPROT 13.4  INR 1.0  CREATININE 0.78    Estimated Creatinine Clearance: 70.5 mL/min (by C-G formula based on SCr of 0.78 mg/dL).   Medical History: Past Medical History:  Diagnosis Date   Anemia    Anxiety    Complex partial seizure disorder (HCC)    last seizure 09/2012   GERD (gastroesophageal reflux disease)    High cholesterol    Hypertension    states BP is up and down; has been on med. x 2 mos.   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  Pharmacy consulted to dose IV heparin  post-op.  CBC WNL; no bleeding reported.  Goal of Therapy:  Heparin  level 0.1-0.2 units/ml Monitor platelets by anticoagulation protocol: Yes   Plan:  Heparin  gtt at 600 units/hr  Check 6 hr heparin  level F/u stop heparin  in AM per protocol   Lavana Huckeba D. Lendell, PharmD, BCPS, BCCCP 07/14/2023, 1:17 PM

## 2023-07-14 NOTE — H&P (Signed)
 Chief Complaint: Patient was seen in consultation today for cerebral aneurysm   Supervising Physician: Dolphus Carrion  Patient Status: [facility] - Out-pt  History of Present Illness: Tammy Rodgers is a 63 y.o. female who has been followed for left PCOM aneurysm for several years. The patient is status post endovascular treatment of a left posterior communicating artery/internal carotid artery aneurysm with a flow diverter on July 06, 2019 with stasis.  Her last MRA showed stability but not decreased in size. After discussion on 6/17 with Dr. Dolphus, she is scheduled for cerebral angiogram with intent for endovascular treatment of the aneurysm.  PMHx, meds, labs, imaging, allergies reviewed. She has taken her ASA and Plavix  as directed. She also has contrast allergy and has taken her Prednisone  pre-med as directed. Feels well, no recent fevers, chills, illness. Has been NPO today as directed. Family at bedside.    Past Medical History:  Diagnosis Date   Anemia    Anxiety    Complex partial seizure disorder (HCC)    last seizure 09/2012   GERD (gastroesophageal reflux disease)    High cholesterol    Hypertension    states BP is up and down; has been on med. x 2 mos.   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   Pre-diabetes    Seizures (HCC)    Sleep apnea    No longer needs CPAP after completing another sleep study   Stroke [facility])     Past Surgical History:  Procedure Laterality Date   ABDOMINAL HYSTERECTOMY  12/13/2002   partial   BREAST LUMPECTOMY WITH NEEDLE LOCALIZATION Left 11/24/2012   Procedure: LEFT BREAST LUMPECTOMY WITH NEEDLE LOCALIZATION;  Surgeon: Vicenta DELENA Poli, MD;  Location: [facility];  Service: General;  Laterality: Left;   CHOLECYSTECTOMY     COLONOSCOPY     IR 3D INDEPENDENT WKST  07/11/2019   IR ANGIO INTRA EXTRACRAN SEL COM CAROTID INNOMINATE UNI R MOD SED  07/06/2019   IR ANGIO INTRA EXTRACRAN SEL INTERNAL CAROTID UNI L MOD SED   07/06/2019   IR ANGIO VERTEBRAL SEL VERTEBRAL UNI L MOD SED  07/06/2019   IR ANGIOGRAM FOLLOW UP STUDY  07/06/2019   IR RADIOLOGIST EVAL & MGMT  12/29/2019   IR RADIOLOGIST EVAL & MGMT  07/02/2020   IR RADIOLOGIST EVAL & MGMT  11/02/2022   IR RADIOLOGIST EVAL & MGMT  06/30/2023   IR TRANSCATH/EMBOLIZ  07/06/2019   IR US  GUIDE VASC ACCESS RIGHT  07/06/2019   PARATHYROIDECTOMY     partial   RADIOLOGY WITH ANESTHESIA N/A 07/06/2019   Procedure: IR WITH ANESTHESIA EMBLOLIZATION;  Surgeon: Dolphus Carrion, MD;  Location: [facility] OR;  Service: Radiology;  Laterality: N/A;    Allergies: Iodinated contrast media and Ritalin  [methylphenidate  hcl]  Medications: Prior to Admission medications   Medication Sig Start Date End Date Taking? Authorizing Provider  amLODipine  (NORVASC ) 10 MG tablet Take 10 mg by mouth daily.     [provider]  aspirin  EC 81 MG tablet Take 81 mg by mouth daily.    [provider]  busPIRone (BUSPAR) 10 MG tablet Take 10 mg by mouth 2 (two) times daily.    [provider]  butalbital -acetaminophen -caffeine  (FIORICET) 50-325-40 MG tablet Take 1 tablet by mouth every 6 (six) hours as needed for headache. 12/29/22   Onita Duos, MD  clopidogrel  (PLAVIX ) 75 MG tablet Take 1 tablet (75 mg total) by mouth daily. 07/05/23 10/03/23  Almeda Sari Slocumb, PA-C  Collagen-Vitamin  C-Biotin (COLLAGEN PO) Take 1 capsule by mouth daily.    [provider]  ezetimibe (ZETIA) 10 MG tablet Take 10 mg by mouth daily. 11/06/21   [provider]  LAMICTAL  200 MG tablet TAKE 1 TABLET IN THE MORNING AND 1.5 TABLETS IN THE EVENING Patient taking differently: Take 200 mg by mouth 2 (two) times daily. 08/12/22   Gayland Lauraine PARAS, NP  losartan -hydrochlorothiazide  (HYZAAR) 50-12.5 MG tablet Take 1 tablet by mouth daily.     [provider]  Multiple Vitamins-Minerals (MULTIVITAMIN WITH MINERALS) tablet Take 1 tablet by mouth daily.    [provider]   omeprazole (PRILOSEC) 20 MG capsule Take 20 mg by mouth 2 (two) times daily before a meal.    [provider]  predniSONE  (DELTASONE ) 20 MG tablet Take number 2 pills daily for 3 days then take number 1 pill daily for 3 days. 04/12/23   Ival Lauraine, FNP  simvastatin (ZOCOR) 20 MG tablet Take 20 mg by mouth daily. 11/06/21   [provider]     Family History  Problem Relation Age of Onset   Stroke Mother    Heart attack Mother    Diabetes Mother    COPD Father    Heart disease Other     Social History   Socioeconomic History   Marital status: Married    Spouse name: Not on file   Number of children: 2   Years of education: Bachelors   Highest education level: Not on file  Occupational History   Occupation: math Magazine features editor: [employer]  Tobacco Use   Smoking status: Never   Smokeless tobacco: Never  Vaping Use   Vaping status: Never Used  Substance and Sexual Activity   Alcohol use: No   Drug use: No   Sexual activity: Yes    Birth control/protection: Surgical    Comment: hyst  Other Topics Concern   Not on file  Social History Narrative   Lives at home with husband.   Right-handed.   No caffeine  use.   Social Drivers of Corporate investment banker Strain: Not on file  Food Insecurity: Not on file  Transportation Needs: Not on file  Physical Activity: Not on file  Stress: Not on file  Social Connections: Not on file    Review of Systems: A 12 point ROS discussed and pertinent positives are indicated in the HPI above.  All other systems are negative.  Review of Systems  Vital Signs: There were no vitals taken for this visit.  Physical Exam HENT:     Mouth/Throat:     Mouth: Mucous membranes are moist.     Pharynx: Oropharynx is clear.   Cardiovascular:     Rate and Rhythm: Normal rate and regular rhythm.     Pulses: Normal pulses.     Heart sounds: Normal heart sounds.  Pulmonary:     Effort: Pulmonary  effort is normal. No respiratory distress.     Breath sounds: Normal breath sounds.   Skin:    General: Skin is warm and dry.   Neurological:     General: No focal deficit present.     Mental Status: She is alert and oriented to person, place, and time.     Cranial Nerves: No cranial nerve deficit.   Psychiatric:        Mood and Affect: Mood normal.        Thought Content: Thought content normal.  Imaging: IR Radiologist Eval & Mgmt Result Date: 06/30/2023 EXAM: ESTABLISHED PATIENT OFFICE VISIT CHIEF COMPLAINT: Follow-up visit to discuss results of recent MRA of the brain. Current Pain Level: 1-10 HISTORY OF PRESENT ILLNESS: Patient is a 63 year old right hand lady who presents accompanied by her sister to discuss the findings of her most recent MRA of the brain. The patient is status post endovascular treatment of a left posterior communicating artery/internal carotid artery aneurysm with a flow diverter on July 06, 2019 with stasis. Since that time, the patient has been followed with serial MRAs of the brain in order to assess the change in the left PCOM aneurysm. The most recent MRA of the brain of 03/16/2023 continues to demonstrate the size of this PCOM aneurysm to be approximately 4.4 mm. Clinically, the patient reports no new sudden severe headaches associated with nausea, vomiting or photophobia. Denies any visual symptoms of diplopia, amaurosis fugax, or of blindness or ptosis or retro-orbital pain. No history of speech difficulties, limb paresthesias, weakness, incoordination, or episodes of loss of awareness or seizure-like activity. Her remainder of review of systems is, otherwise, negative for pathological symptomatology pertaining to cardiovascular, respiratory, GU, GI and musculoskeletal systems. 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 Denies any recent chills, fever or rigors. Past Medical History: No change. Medications: No change. Allergies: No  change.  Reports hypertension with contrast media. Social History: Denies smoking cigarettes or using illicit chemicals. Family History:  Unchanged. REVIEW OF SYSTEMS: Negative unless as mentioned above. PHYSICAL EXAMINATION: Patient is awake, alert, oriented to time, place, space. Speech and comprehension intact. No gross abnormal lateralizing neurological features evident. Station and gait normal. ASSESSMENT AND PLAN: Discussion ensued regarding the management of the residual aneurysm which has essentially remained unchanged for the past 3 years or so. The option of continued conservative management with regular neuro imaging was again reviewed. However, this has revealed no visible decrease in aneurysm size over the past 4 years. Therefore, the aneurysm continues to be a potential risk for rupture with resulting subarachnoid hemorrhage, and potential for morbidity and mortality. The option of retreatment with the endovascular repair with a second flow diverter was discussed in detail. The procedure was reviewed with the patient. This would again be under general anesthesia with the patient being prepped with the dual antiplatelets comprising of aspirin  and Plavix  starting 5 days prior to the procedure. Following the procedure, the patient would spend overnight in the neuro ICU with plans to discharge the following day. Risk of complication of thromboembolic stroke of 2-3%, with a remote possibility of intraprocedural rupture or of a delayed hemorrhage was again discussed in detail. Questions were answered to the satisfaction of the patient, her sister, and her daughter over the phone. Patient the sister and daughter are in agreement to proceed with retreatment endovascularly. This will be scheduled as soon as possible. In the meantime, the patient was advised to continue with her antihypertensive medications, in addition to her other prescribed medications. She was advised to maintain adequate hydration also.  Patient was asked to call for any further questions or concerns. Electronically Signed   By: Thyra Nash M.D.   On: 06/30/2023 08:23    Labs:  CBC: Recent Labs    07/14/23 0555  WBC 4.3  HGB 12.8  HCT 38.6  PLT 262    COAGS: No results for input(s): INR, APTT in the last 8760 hours.  BMP: Recent Labs    07/14/23 0555  NA 139  K 3.7  CL 105  CO2 27  GLUCOSE 145*  BUN 18  CALCIUM 10.4*  CREATININE 0.78  GFRNONAA >60    LIVER FUNCTION TESTS: No results for input(s): BILITOT, AST, ALT, ALKPHOS, PROT, ALBUMIN in the last 8760 hours.   Assessment and Plan: Left PCOM aneurysm Plan for angiogram with endovascular treatment. Risks and benefits of cerebral angio with intervention were discussed with the patient including, but not limited to bleeding, infection, vascular injury or contrast induced renal failure.  This interventional procedure involves the use of X-rays and because of the nature of the planned procedure, it is possible that we will have prolonged use of X-ray fluoroscopy.  Potential radiation risks to you include (but are not limited to) the following: - A slightly elevated risk for cancer  several years later in life. This risk is typically less than 0.5% percent. This risk is low in comparison to the normal incidence of human cancer, which is 33% for women and 50% for men according to the American Cancer Society. - Radiation induced injury can include skin redness, resembling a rash, tissue breakdown / ulcers and hair loss (which can be temporary or permanent).   The likelihood of either of these occurring depends on the difficulty of the procedure and whether you are sensitive to radiation due to previous procedures, disease, or genetic conditions.   IF your procedure requires a prolonged use of radiation, you will be notified and given written instructions for further action.  It is your responsibility to monitor the irradiated area  for the 2 weeks following the procedure and to notify your physician if you are concerned that you have suffered a radiation induced injury.    All of the patient's questions were answered, patient is agreeable to proceed.  Consent signed and in chart.     Electronically Signed: Franky Rusk, PA-C 07/14/2023, 7:40 AM   I spent a total of 20 minutes in face to face in clinical consultation, greater than 50% of which was counseling/coordinating care for cerebral angiogram

## 2023-07-14 NOTE — Progress Notes (Signed)
 Ok to proceed to IR without waiting on PT-INR result per Dr. Dolphus.

## 2023-07-14 NOTE — Progress Notes (Signed)
 Aline pressure reading higher than cuff, ok to go by cuff pressure for parameter goals of SBP 120-140 as long as MAPs correlate and pt is asymptomatic per Dr. Dolphus

## 2023-07-14 NOTE — Sedation Documentation (Signed)
 Transported to PACU via stretcher with CRNA.

## 2023-07-14 NOTE — H&P (Deleted)
   The note originally documented on this encounter has been moved the the encounter in which it belongs.

## 2023-07-14 NOTE — Anesthesia Procedure Notes (Signed)
 Procedure Name: Intubation Date/Time: 07/14/2023 8:48 AM  Performed by: Julien Manus, CRNAPre-anesthesia Checklist: Patient identified, Emergency Drugs available, Suction available and Patient being monitored Patient Re-evaluated:Patient Re-evaluated prior to induction Oxygen Delivery Method: Circle System Utilized Preoxygenation: Pre-oxygenation with 100% oxygen Induction Type: IV induction Ventilation: Mask ventilation without difficulty Laryngoscope Size: Mac and 3 Grade View: Grade I Tube type: Oral Number of attempts: 1 Airway Equipment and Method: Stylet and Oral airway Placement Confirmation: ETT inserted through vocal cords under direct vision, positive ETCO2 and breath sounds checked- equal and bilateral Secured at: 21 cm Tube secured with: Tape Dental Injury: Teeth and Oropharynx as per pre-operative assessment

## 2023-07-14 NOTE — Sedation Documentation (Signed)
 Bedside report given to RN. Femoral site assessed - Level 0, no hematoma, dressing is clean, dry, and intact. Pulses also assessed bilaterally.

## 2023-07-14 NOTE — Anesthesia Postprocedure Evaluation (Signed)
 Anesthesia Post Note  Patient: Tammy Rodgers  Procedure(s) Performed: RADIOLOGY WITH ANESTHESIA     Patient location during evaluation: PACU Anesthesia Type: General Level of consciousness: awake and alert Pain management: pain level controlled Vital Signs Assessment: post-procedure vital signs reviewed and stable Respiratory status: spontaneous breathing, nonlabored ventilation, respiratory function stable and patient connected to nasal cannula oxygen Cardiovascular status: blood pressure returned to baseline and stable Postop Assessment: no apparent nausea or vomiting Anesthetic complications: no  No notable events documented.  Last Vitals:  Vitals:   07/14/23 1445 07/14/23 1500  BP: 120/69 134/70  Pulse: 75 70  Resp: 11 14  Temp:    SpO2: 96% 98%    Last Pain:  Vitals:   07/14/23 1330  TempSrc: Oral  PainSc:                  Esequiel Kleinfelter D Breelynn Bankert

## 2023-07-14 NOTE — Progress Notes (Signed)
 PHARMACY - ANTICOAGULATION CONSULT NOTE  Pharmacy Consult:  Heparin  Indication: Post NeuroIR  Allergies  Allergen Reactions   Iodinated Contrast Media Hypertension   Ritalin  [Methylphenidate  Hcl]     Seizure    Patient Measurements: Height: 5' 3 (160 cm) Weight: 72.6 kg (160 lb) IBW/kg (Calculated) : 52.4 HEPARIN  DW (KG): 67.6  Vital Signs: Temp: 98.9 F (37.2 C) (07/01 2000) Temp Source: Oral (07/01 2000) BP: 122/58 (07/01 1900) Pulse Rate: 73 (07/01 1900)  Labs: Recent Labs    07/14/23 0555 07/14/23 1918  HGB 12.8  --   HCT 38.6  --   PLT 262  --   LABPROT 13.4  --   INR 1.0  --   HEPARINUNFRC  --  0.44  CREATININE 0.78  --     Estimated Creatinine Clearance: 70.5 mL/min (by C-G formula based on SCr of 0.78 mg/dL).   Medical History: Past Medical History:  Diagnosis Date   Anemia    Anxiety    Complex partial seizure disorder (HCC)    last seizure 09/2012   GERD (gastroesophageal reflux disease)    High cholesterol    Hypertension    states BP is up and down; has been on med. x 2 mos.   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  Pharmacy consulted to dose IV heparin  post-op.  CBC WNL; no bleeding reported.  Heparin  level supratherapeutic at 0.44.   Goal of Therapy:  Heparin  level 0.1-0.2 units/ml Monitor platelets by anticoagulation protocol: Yes   Plan:  Reduce Heparin  gtt to 500 units/hr  Check 6 hr heparin  level F/u stop heparin  in AM per protocol   Rankin Sams, PharmD, BCPS, BCCCP Clinical Pharmacist

## 2023-07-14 NOTE — Anesthesia Preprocedure Evaluation (Addendum)
 Anesthesia Evaluation  Patient identified by MRN, date of birth, ID band Patient awake    Reviewed: Allergy & Precautions, NPO status , Patient's Chart, lab work & pertinent test results  Airway Mallampati: II  TM Distance: >3 FB Neck ROM: Full    Dental  (+) Teeth Intact, Dental Advisory Given   Pulmonary sleep apnea    breath sounds clear to auscultation       Cardiovascular hypertension, Pt. on medications  Rhythm:Regular Rate:Normal     Neuro/Psych  Headaches, Seizures -,  PSYCHIATRIC DISORDERS Anxiety      Neuromuscular disease CVA, Residual Symptoms    GI/Hepatic Neg liver ROS,GERD  Medicated,,  Endo/Other  negative endocrine ROS    Renal/GU negative Renal ROS     Musculoskeletal negative musculoskeletal ROS (+)    Abdominal   Peds  Hematology  (+) Blood dyscrasia, anemia   Anesthesia Other Findings   Reproductive/Obstetrics                             Anesthesia Physical Anesthesia Plan  ASA: 3  Anesthesia Plan: General   Post-op Pain Management: Tylenol  PO (pre-op)*   Induction: Intravenous  PONV Risk Score and Plan: 3 and Ondansetron , Dexamethasone  and Midazolam   Airway Management Planned: Oral ETT  Additional Equipment: Arterial line  Intra-op Plan:   Post-operative Plan: Extubation in OR  Informed Consent: I have reviewed the patients History and Physical, chart, labs and discussed the procedure including the risks, benefits and alternatives for the proposed anesthesia with the patient or authorized representative who has indicated his/her understanding and acceptance.       Plan Discussed with: CRNA  Anesthesia Plan Comments:        Anesthesia Quick Evaluation

## 2023-07-15 ENCOUNTER — Other Ambulatory Visit: Payer: Self-pay | Admitting: Physician Assistant

## 2023-07-15 ENCOUNTER — Encounter (HOSPITAL_COMMUNITY): Payer: Self-pay | Admitting: Interventional Radiology

## 2023-07-15 DIAGNOSIS — I671 Cerebral aneurysm, nonruptured: Secondary | ICD-10-CM

## 2023-07-15 LAB — CBC WITH DIFFERENTIAL/PLATELET
Abs Immature Granulocytes: 0.05 10*3/uL (ref 0.00–0.07)
Basophils Absolute: 0 10*3/uL (ref 0.0–0.1)
Basophils Relative: 0 %
Eosinophils Absolute: 0 10*3/uL (ref 0.0–0.5)
Eosinophils Relative: 0 %
HCT: 33.2 % — ABNORMAL LOW (ref 36.0–46.0)
Hemoglobin: 11 g/dL — ABNORMAL LOW (ref 12.0–15.0)
Immature Granulocytes: 0 %
Lymphocytes Relative: 16 %
Lymphs Abs: 1.9 10*3/uL (ref 0.7–4.0)
MCH: 30.8 pg (ref 26.0–34.0)
MCHC: 33.1 g/dL (ref 30.0–36.0)
MCV: 93 fL (ref 80.0–100.0)
Monocytes Absolute: 1.1 10*3/uL — ABNORMAL HIGH (ref 0.1–1.0)
Monocytes Relative: 9 %
Neutro Abs: 8.9 10*3/uL — ABNORMAL HIGH (ref 1.7–7.7)
Neutrophils Relative %: 75 %
Platelets: 241 10*3/uL (ref 150–400)
RBC: 3.57 MIL/uL — ABNORMAL LOW (ref 3.87–5.11)
RDW: 13.8 % (ref 11.5–15.5)
WBC: 11.9 10*3/uL — ABNORMAL HIGH (ref 4.0–10.5)
nRBC: 0 % (ref 0.0–0.2)

## 2023-07-15 LAB — POCT ACTIVATED CLOTTING TIME
Activated Clotting Time: 210 s
Activated Clotting Time: 245 s
Activated Clotting Time: 233 s

## 2023-07-15 LAB — BASIC METABOLIC PANEL WITH GFR
Anion gap: 8 (ref 5–15)
BUN: 8 mg/dL (ref 8–23)
CO2: 26 mmol/L (ref 22–32)
Calcium: 9.4 mg/dL (ref 8.9–10.3)
Chloride: 106 mmol/L (ref 98–111)
Creatinine, Ser: 0.54 mg/dL (ref 0.44–1.00)
GFR, Estimated: >60 mL/min (ref >60)
Glucose, Bld: 110 mg/dL — ABNORMAL HIGH (ref 70–99)
Potassium: 3.3 mmol/L — ABNORMAL LOW (ref 3.5–5.1)
Sodium: 140 mmol/L (ref 135–145)

## 2023-07-15 LAB — GLUCOSE, CAPILLARY: Glucose-Capillary: 101 mg/dL — ABNORMAL HIGH (ref 70–99)

## 2023-07-15 MED ORDER — HEPARIN (PORCINE) 25000 UT/250ML-% IV SOLN: 500.0000 [IU]/h | INTRAVENOUS | Status: AC

## 2023-07-15 MED ORDER — PHENOL 1.4 % MT LIQD: 1.0000 | OROMUCOSAL | Status: DC | PRN

## 2023-07-15 NOTE — Discharge Summary (Signed)
 Patient ID: Tammy Rodgers MRN: [id number] DOB/AGE: 1960/02/13 63 y.o.  Admit date: 07/14/2023 Discharge date: 07/15/2023  Supervising Physician: Dolphus Carrion  Patient Status: [facility] - In-pt  Admission Diagnoses: Cerebral aneurysm  Discharge Diagnoses:  Principal Problem:   Brain aneurysm Active Problems:   Status post coil embolization of cerebral aneurysm   Discharged Condition: good  Hospital Course: Tammy Rodgers is a 63 y.o. female who has been followed for left PCOM aneurysm for several years.   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    Her last MRA showed stability but not decreased in size.   After discussion on 6/17 with Dr. Dolphus, she is scheduled for cerebral angiogram with intent for endovascular treatment of the aneurysm.  Procedure performed yesterday. Dr. Deloria procedure note reads: Status post bilateral common carotid arteriogram 8 left vertebral angiogram.   Right CFA approach.   Findings.   1.  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   Status post endovascular treatment of the persistent aneurysm with placement of a 4 mm x 14 mm pipeline flex flow diverter device with stasis.   8 French Angio-Seal closure device for the right groin puncture site.   She is seen by Dr.Deveshwar today. She is sitting up in bed. She is tolerating a diet.  She denies any neurologic symptoms.   Discharge Exam: Blood pressure (!) 142/74, pulse 67, temperature 98.1 F (36.7 C), temperature source Oral, resp. rate 15, height 5' 3 (1.6 m), weight 160 lb (72.6 kg), SpO2 98%. Alert, awake, and oriented x3. Speech and comprehension intact. PERRL bilaterally. EOMs intact bilaterally without nystagmus or subjective diplopia. Visual  fields intact bilaterally. No facial asymmetry. Tongue midline. Motor power symmetric proportional to effort. No pronator drift. Fine motor and coordination intact and symmetric. 5/5 Strength bilaterally Common femoral artery puncture site looks good, no bleeding, no hematoma, no pseudoaneurysm   Disposition: Discharge disposition: 01-Home or Self Care       Discharge Instructions     Call MD for:  extreme fatigue   Complete by: As directed    Call MD for:  persistant dizziness or light-headedness   Complete by: As directed    Call MD for:  persistant nausea and vomiting   Complete by: As directed    Call MD for:  redness, tenderness, or signs of infection (pain, swelling, redness, odor or green/yellow discharge around incision site)   Complete by: As directed    Call MD for:  severe uncontrolled pain   Complete by: As directed    Call MD for:  temperature >100.4   Complete by: As directed    Diet general   Complete by: As directed    Discharge wound care:   Complete by: As directed    Remove dressing from groin tomorrow. Ok to shower   Driving Restrictions   Complete by: As directed    No driving x 2 weeks. Ok to ride in a car. Ok to travel.   Increase activity slowly   Complete by: As directed    Lifting restrictions   Complete by: As directed    No lifting > 10 pounds, no bending or stooping x 2 weeks      Allergies as of 07/15/2023       Reactions   Iodinated Contrast Media  Hypertension   Ritalin  [methylphenidate  Hcl]    Seizure        Medication List     TAKE these medications    amLODipine  10 MG tablet Commonly known as: NORVASC  Take 10 mg by mouth daily.   aspirin  EC 81 MG tablet Take 81 mg by mouth daily.   busPIRone 10 MG tablet Commonly known as: BUSPAR Take 10 mg by mouth 2 (two) times daily.   butalbital -acetaminophen -caffeine  50-325-40 MG tablet Commonly known as: FIORICET Take 1 tablet by mouth every 6 (six) hours as needed for  headache.   clopidogrel  75 MG tablet Commonly known as: Plavix  Take 1 tablet (75 mg total) by mouth daily.   ezetimibe 10 MG tablet Commonly known as: ZETIA Take 10 mg by mouth daily.   LaMICtal  200 MG tablet Generic drug: lamoTRIgine  TAKE 1 TABLET IN THE MORNING AND 1.5 TABLETS IN THE EVENING What changed:  how much to take how to take this when to take this additional instructions   losartan -hydrochlorothiazide  50-12.5 MG tablet Commonly known as: HYZAAR Take 1 tablet by mouth daily.   multivitamin with minerals tablet Take 1 tablet by mouth daily.   omeprazole 20 MG capsule Commonly known as: PRILOSEC Take 20 mg by mouth 2 (two) times daily before a meal.   predniSONE  20 MG tablet Commonly known as: DELTASONE  Take number 2 pills daily for 3 days then take number 1 pill daily for 3 days.   simvastatin 20 MG tablet Commonly known as: ZOCOR Take 20 mg by mouth daily.               Discharge Care Instructions  (From admission, onward)           Start     Ordered   07/15/23 0000  Discharge wound care:       Comments: Remove dressing from groin tomorrow. Ok to shower   07/15/23 0859           Patient will follow up in 2 weeks with Dr. Dolphus. IR schedulers will call patient with appointment date and time.  Electronically Signed: SARI GORMAN LAMP, PA-C 07/15/2023, 8:59 AM     I have spent Greater Than 30 Minutes discharging Tammy Rodgers.

## 2023-07-16 ENCOUNTER — Other Ambulatory Visit (HOSPITAL_COMMUNITY): Payer: Self-pay | Admitting: Interventional Radiology

## 2023-07-16 DIAGNOSIS — I671 Cerebral aneurysm, nonruptured: Secondary | ICD-10-CM

## 2023-07-23 ENCOUNTER — Encounter: Payer: Self-pay | Admitting: Neurology

## 2023-07-23 ENCOUNTER — Ambulatory Visit (INDEPENDENT_AMBULATORY_CARE_PROVIDER_SITE_OTHER): Admitting: Neurology

## 2023-07-23 VITALS — BP 115/64 | HR 66 | Ht 63.0 in | Wt 157.5 lb

## 2023-07-23 DIAGNOSIS — I671 Cerebral aneurysm, nonruptured: Secondary | ICD-10-CM

## 2023-07-23 DIAGNOSIS — G40209 Localization-related (focal) (partial) symptomatic epilepsy and epileptic syndromes with complex partial seizures, not intractable, without status epilepticus: Secondary | ICD-10-CM | POA: Diagnosis not present

## 2023-07-23 MED ORDER — LAMOTRIGINE 200 MG PO TABS: 200.0000 mg | ORAL_TABLET | Freq: Two times a day (BID) | ORAL | Status: DC

## 2023-07-23 NOTE — Patient Instructions (Signed)
 Great to see you today.  Continue Lamictal  for seizure prevention.  Check labs today.  Please call for seizure activity.  Follow-up in 1 year.  Thanks!!
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
## 2023-07-24 LAB — COMPREHENSIVE METABOLIC PANEL WITH GFR
ALT: 20 IU/L (ref 0–32)
AST: 18 IU/L (ref 0–40)
Albumin: 4.8 g/dL (ref 3.9–4.9)
Alkaline Phosphatase: 94 IU/L (ref 44–121)
BUN/Creatinine Ratio: 20 (ref 12–28)
BUN: 16 mg/dL (ref 8–27)
Bilirubin Total: 0.3 mg/dL (ref 0.0–1.2)
CO2: 26 mmol/L (ref 20–29)
Calcium: 10.8 mg/dL — ABNORMAL HIGH (ref 8.7–10.3)
Chloride: 99 mmol/L (ref 96–106)
Creatinine, Ser: 0.79 mg/dL (ref 0.57–1.00)
Globulin, Total: 2.7 g/dL (ref 1.5–4.5)
Glucose: 88 mg/dL (ref 70–99)
Potassium: 4 mmol/L (ref 3.5–5.2)
Sodium: 142 mmol/L (ref 134–144)
Total Protein: 7.5 g/dL (ref 6.0–8.5)
eGFR: 85 mL/min/1.73 (ref >59)

## 2023-07-24 LAB — LAMOTRIGINE LEVEL: Lamotrigine Lvl: 17.8 ug/mL (ref 2.0–20.0)

## 2023-07-25 ENCOUNTER — Ambulatory Visit: Payer: Self-pay | Admitting: Neurology

## 2023-07-30 ENCOUNTER — Inpatient Hospital Stay (HOSPITAL_COMMUNITY): Admission: RE | Admit: 2023-07-30 | Source: Ambulatory Visit

## 2023-07-30 ENCOUNTER — Telehealth (HOSPITAL_COMMUNITY): Payer: Self-pay | Admitting: Interventional Radiology

## 2023-07-30 NOTE — Telephone Encounter (Signed)
 Per Dr. Dolphus pt needs to be rescheduled for her 1 pm consult today due to a long case he is in. Called pt but had to leave a VM for her to call back to move her appt. JM

## 2023-08-03 ENCOUNTER — Ambulatory Visit (HOSPITAL_COMMUNITY)
Admission: RE | Admit: 2023-08-03 | Discharge: 2023-08-03 | Disposition: A | Discharge status: Home / Self Care | Source: Ambulatory Visit | Attending: Physician Assistant | Admitting: Physician Assistant

## 2023-08-03 DIAGNOSIS — I671 Cerebral aneurysm, nonruptured: Secondary | ICD-10-CM

## 2023-08-04 HISTORY — PX: IR RADIOLOGIST EVAL & MGMT: IMG5224

## 2023-09-06 ENCOUNTER — Other Ambulatory Visit: Payer: Self-pay | Admitting: Neurology

## 2023-09-09 NOTE — Telephone Encounter (Signed)
 Pt called stating that a new Rx is needing to be sent reflecting the change to one in the morning and one in the evening instead of 1 1/2 in the evening. Pt states that she is completely out of her LAMICTAL  200 MG tablet and she is wanting to know if a 7 day emergency fill to the [facility] on [address] while [employer] out the updated Rx. Please advise.

## 2023-09-10 ENCOUNTER — Ambulatory Visit: Admitting: Neurology

## 2023-09-15 ENCOUNTER — Telehealth (HOSPITAL_COMMUNITY): Payer: Self-pay

## 2023-09-15 NOTE — Telephone Encounter (Signed)
 Called to let pt know that our PA's are unable to call in refill for Plavix . Pt will need to see if PCP or neurology can call in refill. AB

## 2023-09-18 ENCOUNTER — Other Ambulatory Visit: Payer: Self-pay | Admitting: Neurology

## 2023-09-18 NOTE — Telephone Encounter (Signed)
 Pt called stating that PCP Informed that Neurologist is suppose to refill PT medication .  PT states that  Provider befoe has left cone . Pt was seeing DR Dolphus   clopidogrel  (PLAVIX ) 75 MG tablet   Pt medication is to be sent to    [facility] - [place], [place] - [address] Phone: [phone]  Fax: [phone]

## 2023-09-21 MED ORDER — CLOPIDOGREL BISULFATE 75 MG PO TABS: 75.0000 mg | ORAL_TABLET | Freq: Every day | ORAL | 0 refills | Status: AC

## 2023-09-21 NOTE — Telephone Encounter (Signed)
 I refilled the Plavix  for patient. I copied plan below. Will also reach out to IR to see if patient has been referred over to new IR MD since Dr. Dolphus is no longer here.   ASSESSMENT AND PLAN: Advised to maintain adequate hydration.   Meds: Aspirin  81 mg a day and Plavix  75 mg for a minimum of 6 months.   Follow-up MRI of the brain and MRA of the brain in 6 months time.   Patient advised to keep appointments with primary for blood pressure management, and continue with her meds as prescribed.   Patient expressed understanding and agreement with the above management plan.

## 2023-09-21 NOTE — Telephone Encounter (Signed)
 Tammy Rodgers,  Looks like you were seeing for sz. Would you be agreeable to plavix  or should I route to pcp? Thanks,  Production assistant, radio

## 2023-09-22 ENCOUNTER — Other Ambulatory Visit (HOSPITAL_COMMUNITY): Payer: Self-pay | Admitting: Radiology

## 2023-09-22 DIAGNOSIS — I671 Cerebral aneurysm, nonruptured: Secondary | ICD-10-CM

## 2023-12-09 ENCOUNTER — Encounter: Payer: Self-pay | Admitting: Neurology

## 2023-12-16 NOTE — Telephone Encounter (Signed)
 Called Dr. Valeda office at [phone] to see if they received referral placed by Dr. Dolphus 09/21/23.   Spoke w/ rep. Transferred me to [facility] VM. LVM asking she call back to discuss pt.

## 2023-12-17 NOTE — Telephone Encounter (Signed)
 Cierra from [facility] called to speak with Tammy Rodgers, transferred call.

## 2023-12-17 NOTE — Telephone Encounter (Signed)
 Spoke w/ Oddis from Dr. Lanis office. States she spoke with Dr. Jammie referral coordinators and asked they fax all referrals since they do not use epic to receive referrals. Unsure if referral faxed by them or not. But she is able to see pt and pull referral. She will call pt to get her scheduled.

## 2024-02-03 ENCOUNTER — Other Ambulatory Visit: Payer: Self-pay | Admitting: Neurosurgery

## 2024-02-03 DIAGNOSIS — I671 Cerebral aneurysm, nonruptured: Secondary | ICD-10-CM

## 2024-03-18 ENCOUNTER — Other Ambulatory Visit (HOSPITAL_COMMUNITY)

## 2024-07-28 ENCOUNTER — Ambulatory Visit: Admitting: Neurology
